# Patient Record
Sex: Female | Born: 1970 | Race: Black or African American | Hispanic: No | Marital: Married | Smoking: Current every day smoker
Health system: Southern US, Community
[De-identification: ages and names within clinical notes are randomized; demographics above are authoritative.]

## PROBLEM LIST (undated history)

## (undated) DIAGNOSIS — R600 Localized edema: Secondary | ICD-10-CM

## (undated) DIAGNOSIS — R200 Anesthesia of skin: Secondary | ICD-10-CM

## (undated) DIAGNOSIS — E785 Hyperlipidemia, unspecified: Secondary | ICD-10-CM

## (undated) DIAGNOSIS — K5190 Ulcerative colitis, unspecified, without complications: Secondary | ICD-10-CM

## (undated) DIAGNOSIS — E876 Hypokalemia: Secondary | ICD-10-CM

## (undated) DIAGNOSIS — I201 Angina pectoris with documented spasm: Secondary | ICD-10-CM

## (undated) DIAGNOSIS — Z8673 Personal history of transient ischemic attack (TIA), and cerebral infarction without residual deficits: Secondary | ICD-10-CM

## (undated) DIAGNOSIS — I219 Acute myocardial infarction, unspecified: Secondary | ICD-10-CM

## (undated) DIAGNOSIS — I214 Non-ST elevation (NSTEMI) myocardial infarction: Secondary | ICD-10-CM

## (undated) DIAGNOSIS — I10 Essential (primary) hypertension: Secondary | ICD-10-CM

## (undated) DIAGNOSIS — K219 Gastro-esophageal reflux disease without esophagitis: Secondary | ICD-10-CM

## (undated) DIAGNOSIS — M797 Fibromyalgia: Secondary | ICD-10-CM

## (undated) HISTORY — DX: Anesthesia of skin: R20.0

## (undated) HISTORY — DX: Non-ST elevation (NSTEMI) myocardial infarction: I21.4

## (undated) HISTORY — PX: ABDOMINAL HYSTERECTOMY: SHX81

## (undated) HISTORY — DX: Hyperlipidemia, unspecified: E78.5

## (undated) HISTORY — PX: EYE SURGERY: SHX253

## (undated) HISTORY — DX: Localized edema: R60.0

## (undated) HISTORY — PX: CARDIAC CATHETERIZATION: SHX172

## (undated) HISTORY — PX: SPLENECTOMY: SUR1306

## (undated) HISTORY — DX: Angina pectoris with documented spasm: I20.1

## (undated) HISTORY — DX: Personal history of transient ischemic attack (TIA), and cerebral infarction without residual deficits: Z86.73

## (undated) HISTORY — DX: Hypokalemia: E87.6

## (undated) HISTORY — PX: CHOLECYSTECTOMY: SHX55

## (undated) HISTORY — PX: APPENDECTOMY: SHX54

---

## 1999-09-07 ENCOUNTER — Other Ambulatory Visit: Admission: RE | Admit: 1999-09-07 | Discharge: 1999-09-07 | Payer: Self-pay | Admitting: Family Medicine

## 2010-09-16 ENCOUNTER — Telehealth: Payer: Self-pay | Admitting: *Deleted

## 2010-09-16 NOTE — Telephone Encounter (Signed)
Recheck if keeps recurring

## 2010-09-16 NOTE — Telephone Encounter (Signed)
Pt is calling to give an update.  She is much better.  Almost toally pain free other than a small amount in her lower back when she moves around.  Did not pass it that she can tell, but she certainly feels much better.

## 2010-09-17 NOTE — Telephone Encounter (Signed)
Left message on pt's voice mail with Dr. Rosezella [place] recommendations.

## 2011-02-15 DIAGNOSIS — R079 Chest pain, unspecified: Secondary | ICD-10-CM | POA: Insufficient documentation

## 2011-02-15 DIAGNOSIS — K219 Gastro-esophageal reflux disease without esophagitis: Secondary | ICD-10-CM | POA: Insufficient documentation

## 2014-01-29 ENCOUNTER — Other Ambulatory Visit: Payer: Self-pay | Admitting: Pain Medicine

## 2014-01-29 DIAGNOSIS — M5136 Other intervertebral disc degeneration, lumbar region: Secondary | ICD-10-CM

## 2014-01-30 ENCOUNTER — Other Ambulatory Visit: Payer: Self-pay | Admitting: Pain Medicine

## 2014-01-30 DIAGNOSIS — M5136 Other intervertebral disc degeneration, lumbar region: Secondary | ICD-10-CM

## 2014-02-02 ENCOUNTER — Other Ambulatory Visit: Payer: Self-pay

## 2014-02-05 ENCOUNTER — Other Ambulatory Visit: Payer: Self-pay

## 2014-02-14 ENCOUNTER — Other Ambulatory Visit: Payer: Self-pay

## 2015-03-20 DIAGNOSIS — I2510 Atherosclerotic heart disease of native coronary artery without angina pectoris: Secondary | ICD-10-CM | POA: Insufficient documentation

## 2015-03-20 DIAGNOSIS — I252 Old myocardial infarction: Secondary | ICD-10-CM | POA: Insufficient documentation

## 2015-03-20 DIAGNOSIS — I10 Essential (primary) hypertension: Secondary | ICD-10-CM | POA: Insufficient documentation

## 2015-04-28 ENCOUNTER — Encounter (HOSPITAL_BASED_OUTPATIENT_CLINIC_OR_DEPARTMENT_OTHER): Payer: Self-pay

## 2015-04-28 ENCOUNTER — Emergency Department (HOSPITAL_BASED_OUTPATIENT_CLINIC_OR_DEPARTMENT_OTHER)
Admission: EM | Admit: 2015-04-28 | Discharge: 2015-04-28 | Disposition: A | Discharge status: Home / Self Care | Payer: PRIVATE HEALTH INSURANCE | Attending: Emergency Medicine | Admitting: Emergency Medicine

## 2015-04-28 ENCOUNTER — Emergency Department (HOSPITAL_BASED_OUTPATIENT_CLINIC_OR_DEPARTMENT_OTHER): Payer: PRIVATE HEALTH INSURANCE

## 2015-04-28 DIAGNOSIS — K219 Gastro-esophageal reflux disease without esophagitis: Secondary | ICD-10-CM | POA: Insufficient documentation

## 2015-04-28 DIAGNOSIS — R05 Cough: Secondary | ICD-10-CM | POA: Insufficient documentation

## 2015-04-28 DIAGNOSIS — Z880 Allergy status to penicillin: Secondary | ICD-10-CM | POA: Insufficient documentation

## 2015-04-28 DIAGNOSIS — I252 Old myocardial infarction: Secondary | ICD-10-CM | POA: Insufficient documentation

## 2015-04-28 DIAGNOSIS — I10 Essential (primary) hypertension: Secondary | ICD-10-CM | POA: Insufficient documentation

## 2015-04-28 DIAGNOSIS — M797 Fibromyalgia: Secondary | ICD-10-CM | POA: Insufficient documentation

## 2015-04-28 DIAGNOSIS — F17200 Nicotine dependence, unspecified, uncomplicated: Secondary | ICD-10-CM | POA: Diagnosis not present

## 2015-04-28 DIAGNOSIS — R059 Cough, unspecified: Secondary | ICD-10-CM

## 2015-04-28 HISTORY — DX: Gastro-esophageal reflux disease without esophagitis: K21.9

## 2015-04-28 HISTORY — DX: Acute myocardial infarction, unspecified: I21.9

## 2015-04-28 HISTORY — DX: Fibromyalgia: M79.7

## 2015-04-28 HISTORY — DX: Essential (primary) hypertension: I10

## 2015-04-28 MED ORDER — BENZONATATE 100 MG PO CAPS: 100.0000 mg | ORAL_CAPSULE | Freq: Three times a day (TID) | ORAL | Status: DC

## 2015-04-28 NOTE — ED Provider Notes (Signed)
CSN: [id number]     Arrival date & time 04/28/15  1609 History   First MD Initiated Contact with Patient 04/28/15 1627     Chief Complaint  Patient presents with  . Cough     (Consider location/radiation/quality/duration/timing/severity/associated sxs/prior Treatment) Patient is a 44 y.o. female presenting with cough. The history is provided by the patient and medical records.  Cough   44 year old female with history of fibromyalgia, MI, hypertension, GERD, presenting to the ED for cough for the past 2 weeks. Patient states cough is wet, but nonproductive. She states she initially had some nasal congestion, sore throat, and sinus pressure, however this is resolved. She denies any fever or chills. Patient has had multiple sick contacts at work. Patient denies any chest pain or shortness of breath. No recent cardiac issues, she has not had any stenting or bypass thus far.  Patient is a daily smoker, approx 1 PPD.  Has tried OTC cough/cold meds without relief.  Past Medical History  Diagnosis Date  . Fibromyalgia   . MI (myocardial infarction) (HCC)   . Hypertension   . GERD (gastroesophageal reflux disease)    Past Surgical History  Procedure Laterality Date  . Splenectomy    . Cholecystectomy    . Appendectomy    . Abdominal hysterectomy    . Eye surgery     No family history on file. Social History  Substance Use Topics  . Smoking status: Current Every Day Smoker  . Smokeless tobacco: None  . Alcohol Use: No   OB History    No data available     Review of Systems  Respiratory: Positive for cough.   All other systems reviewed and are negative.     Allergies  Ivp dye; Lipitor; and Penicillins  Home Medications   Prior to Admission medications   Medication Sig Start Date End Date Taking? Authorizing Provider  AMLODIPINE BESYLATE PO Take by mouth.   Yes Historical Provider, MD  ATENOLOL PO Take by mouth.   Yes Historical Provider, MD  Dexlansoprazole (DEXILANT  PO) Take by mouth.   Yes Historical Provider, MD  LOSARTAN POTASSIUM PO Take by mouth.   Yes Historical Provider, MD  Pregabalin (LYRICA PO) Take by mouth.   Yes Historical Provider, MD  Rosuvastatin Calcium (CRESTOR PO) Take by mouth.   Yes Historical Provider, MD   BP 165/97 mmHg  Pulse 75  Temp(Src) 98.8 F (37.1 C) (Oral)  Resp 18  Ht  (1.702 m)  Wt 91.627 kg  BMI 31.63 kg/m2  SpO2 100%   Physical Exam  Constitutional: She is oriented to person, place, and time. She appears well-developed and well-nourished. No distress.  HENT:  Head: Normocephalic and atraumatic.  Right Ear: Tympanic membrane and ear canal normal.  Left Ear: Tympanic membrane and ear canal normal.  Nose: Nose normal.  Mouth/Throat: Uvula is midline, oropharynx is clear and moist and mucous membranes are normal. No oropharyngeal exudate, posterior oropharyngeal edema, posterior oropharyngeal erythema or tonsillar abscesses.  Eyes: Conjunctivae and EOM are normal. Pupils are equal, round, and reactive to light.  Neck: Normal range of motion. Neck supple.  Cardiovascular: Normal rate, regular rhythm and normal heart sounds.   Pulmonary/Chest: Effort normal and breath sounds normal. No respiratory distress. She has no rales.  Course breath sounds bilaterally without overt wheezes or rhonchi, no rales; speaking in full sentences without difficulty, no distress  Abdominal: Soft. Bowel sounds are normal.  Musculoskeletal: Normal range of motion.  Neurological: She  is alert and oriented to person, place, and time.  Skin: Skin is warm and dry. She is not diaphoretic.  Psychiatric: She has a normal mood and affect.  Nursing note and vitals reviewed.   ED Course  Procedures (including critical care time) Labs Review Labs Reviewed - No data to display  Imaging Review Dg Chest 2 View  04/28/2015  CLINICAL DATA:  Cough, congestion, EXAM: CHEST  2 VIEW COMPARISON:  None. FINDINGS: Cardiomediastinal silhouette  is unremarkable. Mild elevation of the right hemidiaphragm. No acute infiltrate or pleural effusion. No pulmonary edema. Surgical clips are noted in left upper abdomen. Mild degenerative changes lower thoracic spine. IMPRESSION: No active cardiopulmonary disease. Electronically Signed   By: Natasha MeadLiviu  Pop M.D.   On: 04/28/2015 17:14   I have personally reviewed and evaluated these images and lab results as part of my medical decision-making.   EKG Interpretation None      MDM   Final diagnoses:  Cough   44 y.o. F here with cough x 2 weeks.  No chest pain or SOB.  Patient afebrile, non-toxic.  Lungs are coarse sounding, likely from smoking, however no overt wheezes or rhonchi.  CXR was obtained, no acute findings.  Symptoms likely viral in nature.  Patient strongly encouraged to stop smoking as this is likely not helping her symptoms, she was given resources for this.  Rx tessalon.  Encouraged to follow-up with PCP.  Discussed plan with patient, he/she acknowledged understanding and agreed with plan of care.  Return precautions given for new or worsening symptoms.  Garlon HatchetLisa M Aaronmichael Brumbaugh, PA-C 04/28/15 1807  Alvira MondayErin Schlossman, MD 05/03/15 [phone]

## 2015-04-28 NOTE — ED Notes (Signed)
D/c home with rx x 1 for tessalon

## 2015-04-28 NOTE — ED Notes (Signed)
Prod cough x 2 weeks-NAD-steady gait

## 2015-04-28 NOTE — Discharge Instructions (Signed)
Take the prescribed medication as directed to help with cough. Strongly encouraged you to stop smoking.  There are several programs available-- nicorette, chantix, etc.   Follow-up with your primary care physician. Return to the ED for new or worsening symptoms.   If you wish to quit smoking, help is available.  For free tobacco cessation program offerings call the [facility] at [phone] or Live Well Line at [phone] [phone]. You may also visit [email or web address] or email [email or web address]  for more information on other programs.

## 2015-07-27 ENCOUNTER — Emergency Department (HOSPITAL_BASED_OUTPATIENT_CLINIC_OR_DEPARTMENT_OTHER): Payer: PRIVATE HEALTH INSURANCE

## 2015-07-27 ENCOUNTER — Emergency Department (HOSPITAL_BASED_OUTPATIENT_CLINIC_OR_DEPARTMENT_OTHER)
Admission: EM | Admit: 2015-07-27 | Discharge: 2015-07-27 | Disposition: A | Discharge status: Home / Self Care | Payer: PRIVATE HEALTH INSURANCE | Attending: Emergency Medicine | Admitting: Emergency Medicine

## 2015-07-27 ENCOUNTER — Encounter (HOSPITAL_BASED_OUTPATIENT_CLINIC_OR_DEPARTMENT_OTHER): Payer: Self-pay | Admitting: *Deleted

## 2015-07-27 DIAGNOSIS — F17210 Nicotine dependence, cigarettes, uncomplicated: Secondary | ICD-10-CM | POA: Diagnosis not present

## 2015-07-27 DIAGNOSIS — Z880 Allergy status to penicillin: Secondary | ICD-10-CM | POA: Insufficient documentation

## 2015-07-27 DIAGNOSIS — R42 Dizziness and giddiness: Secondary | ICD-10-CM | POA: Insufficient documentation

## 2015-07-27 DIAGNOSIS — M797 Fibromyalgia: Secondary | ICD-10-CM | POA: Diagnosis not present

## 2015-07-27 DIAGNOSIS — J069 Acute upper respiratory infection, unspecified: Secondary | ICD-10-CM | POA: Insufficient documentation

## 2015-07-27 DIAGNOSIS — K219 Gastro-esophageal reflux disease without esophagitis: Secondary | ICD-10-CM | POA: Diagnosis not present

## 2015-07-27 DIAGNOSIS — I10 Essential (primary) hypertension: Secondary | ICD-10-CM | POA: Insufficient documentation

## 2015-07-27 DIAGNOSIS — B9789 Other viral agents as the cause of diseases classified elsewhere: Secondary | ICD-10-CM

## 2015-07-27 DIAGNOSIS — R51 Headache: Secondary | ICD-10-CM | POA: Diagnosis not present

## 2015-07-27 DIAGNOSIS — R079 Chest pain, unspecified: Secondary | ICD-10-CM | POA: Diagnosis present

## 2015-07-27 DIAGNOSIS — I252 Old myocardial infarction: Secondary | ICD-10-CM | POA: Insufficient documentation

## 2015-07-27 DIAGNOSIS — R059 Cough, unspecified: Secondary | ICD-10-CM

## 2015-07-27 DIAGNOSIS — R05 Cough: Secondary | ICD-10-CM

## 2015-07-27 LAB — CBC WITH DIFFERENTIAL/PLATELET
BASOS ABS: 0 10*3/uL (ref 0.0–0.1)
BASOS PCT: 0 %
EOS ABS: 0.2 10*3/uL (ref 0.0–0.7)
EOS PCT: 4 %
HCT: 36.7 % (ref 36.0–46.0)
Hemoglobin: 12.9 g/dL (ref 12.0–15.0)
LYMPHS PCT: 26 %
Lymphs Abs: 1.6 10*3/uL (ref 0.7–4.0)
MCH: 30.2 pg (ref 26.0–34.0)
MCHC: 35.1 g/dL (ref 30.0–36.0)
MCV: 85.9 fL (ref 78.0–100.0)
MONO ABS: 0.7 10*3/uL (ref 0.1–1.0)
Monocytes Relative: 11 %
Neutro Abs: 3.5 10*3/uL (ref 1.7–7.7)
Neutrophils Relative %: 59 %
PLATELETS: 334 10*3/uL (ref 150–400)
RBC: 4.27 MIL/uL (ref 3.87–5.11)
RDW: 13.7 % (ref 11.5–15.5)
WBC: 6 10*3/uL (ref 4.0–10.5)

## 2015-07-27 LAB — BASIC METABOLIC PANEL
ANION GAP: 6 (ref 5–15)
BUN: 7 mg/dL (ref 6–20)
CALCIUM: 8.3 mg/dL — AB (ref 8.9–10.3)
CO2: 24 mmol/L (ref 22–32)
Chloride: 107 mmol/L (ref 101–111)
Creatinine, Ser: 0.82 mg/dL (ref 0.44–1.00)
Glucose, Bld: 96 mg/dL (ref 65–99)
POTASSIUM: 3.5 mmol/L (ref 3.5–5.1)
SODIUM: 137 mmol/L (ref 135–145)

## 2015-07-27 LAB — TROPONIN I

## 2015-07-27 NOTE — ED Notes (Signed)
Pt reports dizzy and headache since Saturday. Chest pain yesterday morning. States sx "come and go". Speech clear, facial symmetry present. No reported fever

## 2015-07-27 NOTE — ED Provider Notes (Signed)
CSN: [id number]     Arrival date & time 07/27/15  0944 History   First MD Initiated Contact with Patient 07/27/15 [phone]     Chief Complaint  Patient presents with  . Headache  . Dizziness  . Chest Pain     (Consider location/radiation/quality/duration/timing/severity/associated sxs/prior Treatment) HPI Comments: Patient presents today with dizziness, headache, cough, sore throat, congestion, nausea, subjective fever, and body aches.  Onset of symptoms two days ago, which are gradually worsening.  She has not taken any medications for her symptoms.  She reports sick contacts with similar symptoms.  Denies vomiting, diarrhea, ear pain, sinus pain, syncope, SOB, or neck pain/stiffness.  The history is provided by the patient.    Past Medical History  Diagnosis Date  . Fibromyalgia   . MI (myocardial infarction) (HCC)   . Hypertension   . GERD (gastroesophageal reflux disease)    Past Surgical History  Procedure Laterality Date  . Splenectomy    . Cholecystectomy    . Appendectomy    . Abdominal hysterectomy    . Eye surgery    . Cardiac catheterization     No family history on file. Social History  Substance Use Topics  . Smoking status: Current Every Day Smoker    Types: Cigarettes  . Smokeless tobacco: Never Used  . Alcohol Use: No   OB History    No data available     Review of Systems  All other systems reviewed and are negative.     Allergies  Ivp dye; Lipitor; and Penicillins  Home Medications   Prior to Admission medications   Medication Sig Start Date End Date Taking? Authorizing Provider  AMLODIPINE BESYLATE PO Take by mouth.   Yes Historical Provider, MD  ATENOLOL PO Take by mouth.   Yes Historical Provider, MD  LOSARTAN POTASSIUM PO Take by mouth.   Yes Historical Provider, MD  oxyCODONE (ROXICODONE) 15 MG immediate release tablet Take 15 mg by mouth every 4 (four) hours as needed for pain.   Yes Historical Provider, MD  Rosuvastatin Calcium  (CRESTOR PO) Take by mouth.   Yes Historical Provider, MD  benzonatate (TESSALON) 100 MG capsule Take 1 capsule (100 mg total) by mouth every 8 (eight) hours. 04/28/15   Garlon HatchetLisa M Sanders, PA-C  Dexlansoprazole (DEXILANT PO) Take by mouth.    Historical Provider, MD  Pregabalin (LYRICA PO) Take by mouth.    Historical Provider, MD   BP 156/99 mmHg  Pulse 78  Temp(Src) 98.3 F (36.8 C) (Oral)  Resp 18  Ht 5\' 7"  (1.702 m)  Wt 87.544 kg  BMI 30.22 kg/m2  SpO2 95% Physical Exam  Constitutional: She is oriented to person, place, and time. She appears well-developed and well-nourished.  HENT:  Head: Normocephalic and atraumatic.  Right Ear: Tympanic membrane and ear canal normal.  Left Ear: Tympanic membrane and ear canal normal.  Mouth/Throat: Uvula is midline and oropharynx is clear and moist. No oropharyngeal exudate, posterior oropharyngeal edema, posterior oropharyngeal erythema or tonsillar abscesses.  Eyes: Pupils are equal, round, and reactive to light.  Left eye stribismus  Neck: Normal range of motion. Neck supple.  Cardiovascular: Normal rate, regular rhythm and normal heart sounds.   Pulmonary/Chest: Effort normal and breath sounds normal.  Abdominal: Soft. There is no tenderness.  Musculoskeletal: Normal range of motion.  Neurological: She is alert and oriented to person, place, and time. She has normal strength. No cranial nerve deficit or sensory deficit. Coordination and gait normal.  Skin:  Skin is warm and dry.  Psychiatric: She has a normal mood and affect.  Nursing note and vitals reviewed.   ED Course  Procedures (including critical care time) Labs Review Labs Reviewed - No data to display  Imaging Review No results found. I have personally reviewed and evaluated these images and lab results as part of my medical decision-making.   EKG Interpretation   Date/Time:  Monday July 27 2015 09:59:06 EDT Ventricular Rate:  75 PR Interval:  162 QRS Duration:  100 QT Interval:  363 QTC Calculation: 405 R Axis:   154 Text Interpretation:  Right and left arm electrode reversal Sinus rhythm  Confirmed by DELO  MD, DOUGLAS ([id number]) on 07/27/2015 10:05:10 AM      MDM   Final diagnoses:  None   Patient presents today with subjective fever, headache, cough, body aches, cough, sore throat, nausea, and dizziness x 2 days.  Patient afebrile and non toxic appearing in the ED.  Labs unremarkable.  CXR is negative.  Feel that the patient is stable for discharge.  Return precautions given.    Santiago Glad, PA-C 07/28/15 1914  Geoffery Lyons, MD 07/30/15 [phone]

## 2015-07-27 NOTE — ED Notes (Signed)
Patient transported to X-ray 

## 2016-02-25 DIAGNOSIS — D72829 Elevated white blood cell count, unspecified: Secondary | ICD-10-CM | POA: Insufficient documentation

## 2016-10-19 ENCOUNTER — Emergency Department (HOSPITAL_BASED_OUTPATIENT_CLINIC_OR_DEPARTMENT_OTHER): Payer: PRIVATE HEALTH INSURANCE

## 2016-10-19 ENCOUNTER — Emergency Department (HOSPITAL_BASED_OUTPATIENT_CLINIC_OR_DEPARTMENT_OTHER)
Admission: EM | Admit: 2016-10-19 | Discharge: 2016-10-20 | Disposition: A | Discharge status: Other Acute Inpatient Hospital | Payer: PRIVATE HEALTH INSURANCE | Attending: Emergency Medicine | Admitting: Emergency Medicine

## 2016-10-19 ENCOUNTER — Encounter (HOSPITAL_BASED_OUTPATIENT_CLINIC_OR_DEPARTMENT_OTHER): Payer: Self-pay | Admitting: *Deleted

## 2016-10-19 DIAGNOSIS — I639 Cerebral infarction, unspecified: Secondary | ICD-10-CM

## 2016-10-19 DIAGNOSIS — F17210 Nicotine dependence, cigarettes, uncomplicated: Secondary | ICD-10-CM | POA: Insufficient documentation

## 2016-10-19 HISTORY — DX: Ulcerative colitis, unspecified, without complications: K51.90

## 2016-10-19 LAB — CBC WITH DIFFERENTIAL/PLATELET
Basophils Absolute: 0 10*3/uL (ref 0.0–0.1)
Basophils Relative: 0 %
EOS ABS: 0.4 10*3/uL (ref 0.0–0.7)
Eosinophils Relative: 3 %
HCT: 37.1 % (ref 36.0–46.0)
HEMOGLOBIN: 13.1 g/dL (ref 12.0–15.0)
LYMPHS ABS: 2.9 10*3/uL (ref 0.7–4.0)
LYMPHS PCT: 22 %
MCH: 32.7 pg (ref 26.0–34.0)
MCHC: 35.3 g/dL (ref 30.0–36.0)
MCV: 92.5 fL (ref 78.0–100.0)
MONOS PCT: 8 %
Monocytes Absolute: 1 10*3/uL (ref 0.1–1.0)
NEUTROS PCT: 67 %
Neutro Abs: 8.8 10*3/uL — ABNORMAL HIGH (ref 1.7–7.7)
Platelets: 297 10*3/uL (ref 150–400)
RBC: 4.01 MIL/uL (ref 3.87–5.11)
RDW: 16 % — ABNORMAL HIGH (ref 11.5–15.5)
WBC: 13.1 10*3/uL — AB (ref 4.0–10.5)

## 2016-10-19 LAB — COMPREHENSIVE METABOLIC PANEL
ALK PHOS: 62 U/L (ref 38–126)
ALT: 12 U/L — AB (ref 14–54)
ANION GAP: 7 (ref 5–15)
AST: 17 U/L (ref 15–41)
Albumin: 3.6 g/dL (ref 3.5–5.0)
BILIRUBIN TOTAL: 0.9 mg/dL (ref 0.3–1.2)
BUN: 13 mg/dL (ref 6–20)
CALCIUM: 8.9 mg/dL (ref 8.9–10.3)
CO2: 24 mmol/L (ref 22–32)
CREATININE: 0.9 mg/dL (ref 0.44–1.00)
Chloride: 106 mmol/L (ref 101–111)
Glucose, Bld: 88 mg/dL (ref 65–99)
Potassium: 3.6 mmol/L (ref 3.5–5.1)
SODIUM: 137 mmol/L (ref 135–145)
TOTAL PROTEIN: 6.7 g/dL (ref 6.5–8.1)

## 2016-10-19 LAB — TROPONIN I

## 2016-10-19 MED ORDER — ASPIRIN 81 MG PO CHEW
324.0000 mg | CHEWABLE_TABLET | Freq: Once | ORAL | Status: AC
  Administered 2016-10-19: 324 mg via ORAL
  Filled 2016-10-19: qty 4

## 2016-10-19 NOTE — ED Triage Notes (Signed)
Pt reports intermittent R-sided numbness and tingling lasting for several seconds since this past Saturday. Reports episodes are followed by weakness that quickly resolves. Pt reports she started taking Vistaril this past Friday. No symptoms at this time; face symmetrical, grips equal. Pt currently on Cipro and Flagyl for recent UC flare.

## 2016-10-19 NOTE — ED Notes (Signed)
Patient transported to CT 

## 2016-10-19 NOTE — ED Provider Notes (Addendum)
[facility] Provider Note   CSN: [id number] Arrival date & time: 10/19/16  1815  By signing my name below, I, Rosario AdieWilliam Andrew Hiatt, attest that this documentation has been prepared under the direction and in the presence of Vanetta MuldersZackowski, Laureen Frederic, MD. Electronically Signed: Rosario AdieWilliam Andrew Hiatt, ED Scribe. 10/19/16. 9:39 PM.  History   Chief Complaint Chief Complaint  Patient presents with  . Numbness   The history is provided by the patient. No language interpreter was used.    HPI Comments: Paige NormanSonya Woodward is a 46 y.o. female with a PMHx of GERD, HTN, Fibromyalgia, prior MI, and ulcerative colitis, who presents to the Emergency Department complaining of intermittent episodes of right-sided numbness beginning four days ago. Per pt, over the past several days she has had two episodes of numbness four days ago which lasted for only a several seconds prior to resolving. She reports that her numbness begins in her right hand and will extend up the right arm into the right-sided jaw. She also reports some numbness into the right leg as well. Pt also reports some blurry visual "spots" within her vision accompanying her numbness which will last for several hours and some shortness of breath only with her episodes of numbness. Pt additionally endorses some headache, lower back pain, and one episode of vomiting one day prior to the onset of her numbness. She has not noticed any precipitating factors to her numbness. Pt reports that she began taking Vistaril the same day as her numbness began. No noted treatments for her symptoms were tried prior to coming into the ED. Pt denies fever, chills, congestion, rhinorrhea, sore throat, cough, CP, leg swelling, abdominal pain, diarrhea, dysuria, hematuria, neck pain, rash, bruising/bleeding easily. Pt is not currently on anticoagulants.   PCP: Norm SaltVanstory, Ashley N, PA-C  Neurologist: Verlee RossettiAndrew Dein, PA-C    Past Medical History:  Diagnosis Date  .  Fibromyalgia   . GERD (gastroesophageal reflux disease)   . Hypertension   . MI (myocardial infarction) (HCC)   . Ulcerative colitis (HCC)    There are no active problems to display for this patient.  Past Surgical History:  Procedure Laterality Date  . ABDOMINAL HYSTERECTOMY    . APPENDECTOMY    . CARDIAC CATHETERIZATION    . CHOLECYSTECTOMY    . EYE SURGERY    . SPLENECTOMY     OB History    No data available     Home Medications    Prior to Admission medications   Medication Sig Start Date End Date Taking? Authorizing Provider  AMLODIPINE BESYLATE PO Take by mouth.   Yes [provider]  ATENOLOL PO Take by mouth.   Yes [provider]  HydrOXYzine Pamoate (VISTARIL PO) Take by mouth.   Yes [provider]  LOSARTAN POTASSIUM PO Take by mouth.   Yes [provider]  OMEPRAZOLE PO Take by mouth.   Yes [provider]  oxyCODONE (ROXICODONE) 15 MG immediate release tablet Take 15 mg by mouth every 4 (four) hours as needed for pain.   Yes [provider]  benzonatate (TESSALON) 100 MG capsule Take 1 capsule (100 mg total) by mouth every 8 (eight) hours. 04/28/15   Garlon HatchetSanders, Lisa M, PA-C  Dexlansoprazole (DEXILANT PO) Take by mouth.    [provider]  Pregabalin (LYRICA PO) Take by mouth.    [provider]  Rosuvastatin Calcium (CRESTOR PO) Take by mouth.    [provider]   Family History No family  history on file.  Social History Social History  Substance Use Topics  . Smoking status: Current Every Day Smoker    Types: Cigarettes  . Smokeless tobacco: Never Used  . Alcohol use No   Allergies   Ivp dye [iodinated diagnostic agents]; Lipitor [atorvastatin]; and Penicillins  Review of Systems Review of Systems  Constitutional: Negative for chills and fever.  HENT: Negative for congestion, rhinorrhea and sore throat.   Eyes: Positive for visual disturbance.  Respiratory: Negative for  cough and shortness of breath.   Cardiovascular: Negative for chest pain and leg swelling.  Gastrointestinal: Negative for abdominal pain, diarrhea, nausea and vomiting.  Genitourinary: Negative for dysuria and hematuria.  Musculoskeletal: Positive for back pain. Negative for neck pain.  Skin: Negative for rash.  Neurological: Positive for numbness and headaches.  Hematological: Does not bruise/bleed easily.   Physical Exam Updated Vital Signs BP 110/72   Pulse 65   Temp 98.3 F (36.8 C) (Oral)   Resp 16   Ht 1.702 m (5\' 7" )   Wt 73 kg (161 lb)   SpO2 96%   BMI 25.22 kg/m   Physical Exam  Constitutional: She is oriented to person, place, and time. She appears well-developed and well-nourished.  HENT:  Head: Normocephalic.  Mouth/Throat: Oropharynx is clear and moist.  Eyes: Conjunctivae are normal. Pupils are equal, round, and reactive to light. Right eye exhibits no discharge. Left eye exhibits no discharge. No scleral icterus.  Left eye with normal EOMs. Right eye with lateral gaze, but unable to gaze downward and medially which is chronic, per pt.   Cardiovascular: Normal rate, regular rhythm and normal heart sounds.   No murmur heard. Pulmonary/Chest: Effort normal and breath sounds normal. No respiratory distress. She has no wheezes. She has no rales.  Abdominal: Soft. Bowel sounds are normal. She exhibits no distension. There is no tenderness.  Musculoskeletal: Normal range of motion. She exhibits no edema.  Neurological: She is alert and oriented to person, place, and time. No cranial nerve deficit. She exhibits normal muscle tone. Coordination normal.  No pronator drift.   Skin: Skin is warm and dry.  Psychiatric: She has a normal mood and affect.  Nursing note and vitals reviewed.  ED Treatments / Results  DIAGNOSTIC STUDIES: Oxygen Saturation is 100% on RA, normal by my interpretation.   COORDINATION OF CARE: 9:39 PM-Discussed next steps with pt. Pt verbalized  understanding and is agreeable with the plan.   Labs (all labs ordered are listed, but only abnormal results are displayed) Labs Reviewed  CBC WITH DIFFERENTIAL/PLATELET - Abnormal; Notable for the following:       Result Value   WBC 13.1 (*)    RDW 16.0 (*)    Neutro Abs 8.8 (*)    All other components within normal limits  COMPREHENSIVE METABOLIC PANEL - Abnormal; Notable for the following:    ALT 12 (*)    All other components within normal limits  TROPONIN I   Results for orders placed or performed during the hospital encounter of 10/19/16  CBC with Differential  Result Value Ref Range   WBC 13.1 (H) 4.0 - 10.5 K/uL   RBC 4.01 3.87 - 5.11 MIL/uL   Hemoglobin 13.1 12.0 - 15.0 g/dL   HCT 16.1 09.6 - 04.5 %   MCV 92.5 78.0 - 100.0 fL   MCH 32.7 26.0 - 34.0 pg   MCHC 35.3 30.0 - 36.0 g/dL   RDW 40.9 (H) 81.1 - 91.4 %  Platelets 297 150 - 400 K/uL   Neutrophils Relative % 67 %   Neutro Abs 8.8 (H) 1.7 - 7.7 K/uL   Lymphocytes Relative 22 %   Lymphs Abs 2.9 0.7 - 4.0 K/uL   Monocytes Relative 8 %   Monocytes Absolute 1.0 0.1 - 1.0 K/uL   Eosinophils Relative 3 %   Eosinophils Absolute 0.4 0.0 - 0.7 K/uL   Basophils Relative 0 %   Basophils Absolute 0.0 0.0 - 0.1 K/uL  Comprehensive metabolic panel  Result Value Ref Range   Sodium 137 135 - 145 mmol/L   Potassium 3.6 3.5 - 5.1 mmol/L   Chloride 106 101 - 111 mmol/L   CO2 24 22 - 32 mmol/L   Glucose, Bld 88 65 - 99 mg/dL   BUN 13 6 - 20 mg/dL   Creatinine, Ser 1.61 0.44 - 1.00 mg/dL   Calcium 8.9 8.9 - 09.6 mg/dL   Total Protein 6.7 6.5 - 8.1 g/dL   Albumin 3.6 3.5 - 5.0 g/dL   AST 17 15 - 41 U/L   ALT 12 (L) 14 - 54 U/L   Alkaline Phosphatase 62 38 - 126 U/L   Total Bilirubin 0.9 0.3 - 1.2 mg/dL   GFR calc non Af Amer >60 >60 mL/min   GFR calc Af Amer >60 >60 mL/min   Anion gap 7 5 - 15  Troponin I  Result Value Ref Range   Troponin I <0.03 <0.03 ng/mL    EKG  EKG Interpretation  Date/Time:  Wednesday  October 19 2016 18:32:03 EDT Ventricular Rate:  62 PR Interval:  134 QRS Duration: 88 QT Interval:  386 QTC Calculation: 391 R Axis:   25 Text Interpretation:  Normal sinus rhythm Normal ECG Confirmed by Vanetta Mulders [phone]) on 10/19/2016 6:42:48 PM      Radiology Ct Head Wo Contrast  Result Date: 10/19/2016 CLINICAL DATA:  Periodically right arm weakness and numbness for 5 days EXAM: CT HEAD WITHOUT CONTRAST TECHNIQUE: Contiguous axial images were obtained from the base of the skull through the vertex without intravenous contrast. COMPARISON:  None. FINDINGS: Brain: No hemorrhage or intracranial mass is seen. Subtle hypodensity within the left posterior frontal lobe cortex. Ventricles are nonenlarged. Vascular: No hyperdense vessels.  No unexpected calcification. Skull: No fracture or suspicious bone lesion Sinuses/Orbits: No acute finding. Other: None IMPRESSION: Subtle hypodensity in the left frontal lobe, cannot exclude a small focus of edema or infarct. Further evaluation with MRI is suggested. Electronically Signed   By: Jasmine Pang M.D.   On: 10/19/2016 21:34   Procedures Procedures   Medications Ordered in ED Medications - No data to display  Initial Impression / Assessment and Plan / ED Course  I have reviewed the triage vital signs and the nursing notes.  Pertinent labs & imaging results that were available during my care of the patient were reviewed by me and considered in my medical decision making (see chart for details).    CRITICAL CARE Performed by: Vanetta Mulders Total critical care time: 30 minutes Critical care time was exclusive of separately billable procedures and treating other patients. Critical care was necessary to treat or prevent imminent or life-threatening deterioration. Critical care was time spent personally by me on the following activities: development of treatment plan with patient and/or surrogate as well as nursing, discussions with consultants,  evaluation of patient's response to treatment, examination of patient, obtaining history from patient or surrogate, ordering and performing treatments and interventions, ordering and review of  laboratory studies, ordering and review of radiographic studies, pulse oximetry and re-evaluation of patient's condition.  Patient with complaint of symptoms concerning for possible TIA. But very brief period and head CT shows evidence of a left frontal lobe acute infarct. Possibly could be something else but does require MRI to evaluate further. Not sure if patient's symptoms are related to this or not. Discussed with hospitalist at [facility]. Patient's primary care doctor is based out of that hospital. They will admit to telemetry. As a CVA. Patient be transported by [employer].  Patients currently asymptomatic. No neurological symptoms currently. Long STANDING of right eye muscular problem since age 67 so has been no good downward gauge or medial gaze.  Patient also with some chest discomfort which she says more consistent with her reflux. She's not too concerned about that. Troponin was negative. EKG didn't have any acute findings.  Patient will be transported to [facility] for admission to telemetry bed by [facility]. Transfer paperwork completed. Nevus with a normal  The patient is followed by neurology [place] [facility].    A she'll be given aspirin here.   Final Clinical Impressions(s) / ED Diagnoses   Final diagnoses:  Cerebrovascular accident (CVA), unspecified mechanism (HCC)   New Prescriptions New Prescriptions   No medications on file   I personally performed the services described in this documentation, which was scribed in my presence. The recorded information has been reviewed and is accurate.       Vanetta Mulders, MD 10/19/16 2440    Vanetta Mulders, MD 10/19/16 2308

## 2016-10-19 NOTE — ED Notes (Signed)
C/o rt side numbness off and on since Saturday lasting a few seconds at a time,  States starts in hand radiates up arm to face and  leg,  Has not seen any facial droop.  Pt a&o

## 2016-10-19 NOTE — ED Notes (Signed)
Pt now c/o chest pain  2nd ekg obtained

## 2016-10-20 DIAGNOSIS — G459 Transient cerebral ischemic attack, unspecified: Secondary | ICD-10-CM | POA: Insufficient documentation

## 2016-10-20 DIAGNOSIS — I63512 Cerebral infarction due to unspecified occlusion or stenosis of left middle cerebral artery: Secondary | ICD-10-CM | POA: Insufficient documentation

## 2016-11-10 DIAGNOSIS — D6859 Other primary thrombophilia: Secondary | ICD-10-CM | POA: Insufficient documentation

## 2017-09-23 ENCOUNTER — Other Ambulatory Visit: Payer: Self-pay

## 2017-09-23 ENCOUNTER — Emergency Department (HOSPITAL_BASED_OUTPATIENT_CLINIC_OR_DEPARTMENT_OTHER)
Admission: EM | Admit: 2017-09-23 | Discharge: 2017-09-23 | Disposition: A | Discharge status: Home / Self Care | Payer: BLUE CROSS/BLUE SHIELD | Attending: Emergency Medicine | Admitting: Emergency Medicine

## 2017-09-23 ENCOUNTER — Encounter (HOSPITAL_BASED_OUTPATIENT_CLINIC_OR_DEPARTMENT_OTHER): Payer: Self-pay | Admitting: Adult Health

## 2017-09-23 DIAGNOSIS — R1013 Epigastric pain: Secondary | ICD-10-CM

## 2017-09-23 DIAGNOSIS — Z79899 Other long term (current) drug therapy: Secondary | ICD-10-CM | POA: Diagnosis not present

## 2017-09-23 DIAGNOSIS — F17210 Nicotine dependence, cigarettes, uncomplicated: Secondary | ICD-10-CM | POA: Insufficient documentation

## 2017-09-23 DIAGNOSIS — I252 Old myocardial infarction: Secondary | ICD-10-CM | POA: Insufficient documentation

## 2017-09-23 DIAGNOSIS — I10 Essential (primary) hypertension: Secondary | ICD-10-CM | POA: Diagnosis not present

## 2017-09-23 DIAGNOSIS — R110 Nausea: Secondary | ICD-10-CM

## 2017-09-23 LAB — CBC WITH DIFFERENTIAL/PLATELET
BASOS ABS: 0 10*3/uL (ref 0.0–0.1)
BASOS PCT: 0 %
EOS ABS: 0.1 10*3/uL (ref 0.0–0.7)
EOS PCT: 0 %
HCT: 40.7 % (ref 36.0–46.0)
Hemoglobin: 15 g/dL (ref 12.0–15.0)
Lymphocytes Relative: 12 %
Lymphs Abs: 1.8 10*3/uL (ref 0.7–4.0)
MCH: 32.5 pg (ref 26.0–34.0)
MCHC: 36.9 g/dL — ABNORMAL HIGH (ref 30.0–36.0)
MCV: 88.3 fL (ref 78.0–100.0)
MONO ABS: 0.9 10*3/uL (ref 0.1–1.0)
Monocytes Relative: 6 %
Neutro Abs: 12.4 10*3/uL — ABNORMAL HIGH (ref 1.7–7.7)
Neutrophils Relative %: 82 %
PLATELETS: 256 10*3/uL (ref 150–400)
RBC: 4.61 MIL/uL (ref 3.87–5.11)
RDW: 14.7 % (ref 11.5–15.5)
WBC: 15.2 10*3/uL — AB (ref 4.0–10.5)

## 2017-09-23 LAB — COMPREHENSIVE METABOLIC PANEL
ALBUMIN: 4.3 g/dL (ref 3.5–5.0)
ALK PHOS: 74 U/L (ref 38–126)
ALT: 10 U/L — ABNORMAL LOW (ref 14–54)
AST: 16 U/L (ref 15–41)
Anion gap: 11 (ref 5–15)
BILIRUBIN TOTAL: 0.8 mg/dL (ref 0.3–1.2)
BUN: 18 mg/dL (ref 6–20)
CALCIUM: 9.6 mg/dL (ref 8.9–10.3)
CO2: 25 mmol/L (ref 22–32)
CREATININE: 0.92 mg/dL (ref 0.44–1.00)
Chloride: 100 mmol/L — ABNORMAL LOW (ref 101–111)
GFR calc Af Amer: >60 mL/min (ref >60)
GLUCOSE: 115 mg/dL — AB (ref 65–99)
POTASSIUM: 3.2 mmol/L — AB (ref 3.5–5.1)
Sodium: 136 mmol/L (ref 135–145)
TOTAL PROTEIN: 7.6 g/dL (ref 6.5–8.1)

## 2017-09-23 LAB — LIPASE, BLOOD: LIPASE: 34 U/L (ref 11–51)

## 2017-09-23 LAB — OCCULT BLOOD X 1 CARD TO LAB, STOOL: Fecal Occult Bld: NEGATIVE

## 2017-09-23 MED ORDER — FAMOTIDINE IN NACL 20-0.9 MG/50ML-% IV SOLN
20.0000 mg | Freq: Once | INTRAVENOUS | Status: AC
  Administered 2017-09-23: 20 mg via INTRAVENOUS
  Filled 2017-09-23: qty 50

## 2017-09-23 MED ORDER — ONDANSETRON HCL 4 MG/2ML IJ SOLN
4.0000 mg | Freq: Once | INTRAMUSCULAR | Status: AC
  Administered 2017-09-23: 4 mg via INTRAVENOUS
  Filled 2017-09-23: qty 2

## 2017-09-23 MED ORDER — SODIUM CHLORIDE 0.9 % IV BOLUS
1000.0000 mL | Freq: Once | INTRAVENOUS | Status: AC
  Administered 2017-09-23: 1000 mL via INTRAVENOUS

## 2017-09-23 MED ORDER — POTASSIUM CHLORIDE CRYS ER 20 MEQ PO TBCR
40.0000 meq | EXTENDED_RELEASE_TABLET | Freq: Once | ORAL | Status: AC
  Administered 2017-09-23: 40 meq via ORAL
  Filled 2017-09-23: qty 2

## 2017-09-23 MED ORDER — PROMETHAZINE HCL 25 MG PO TABS: 25.0000 mg | ORAL_TABLET | Freq: Four times a day (QID) | ORAL | 0 refills | Status: DC | PRN

## 2017-09-23 MED ORDER — GI COCKTAIL ~~LOC~~
30.0000 mL | Freq: Once | ORAL | Status: AC
  Administered 2017-09-23: 30 mL via ORAL
  Filled 2017-09-23: qty 30

## 2017-09-23 MED ORDER — SUCRALFATE 1 G PO TABS: 1.0000 g | ORAL_TABLET | Freq: Three times a day (TID) | ORAL | 0 refills | Status: DC

## 2017-09-23 NOTE — Discharge Instructions (Signed)
It was my pleasure taking care of you today!   I have given you a refill of your phenergan to take as needed for nausea.   Carafate with meals and at bedtime to help with epigastric pain.  Continue taking your protonix as described.   Please call your GI doctor on Monday to schedule a follow up appointment.   Return to ER for new or worsening symptoms, any additional concerns.

## 2017-09-23 NOTE — ED Provider Notes (Signed)
[facility] EMERGENCY DEPARTMENT Provider Note   CSN: [id number] Arrival date & time: 09/23/17  1250     History   Chief Complaint Chief Complaint  Patient presents with  . Nausea    HPI Paige Woodward is a 47 y.o. female.  The history is provided by the patient and medical records. No language interpreter was used.   Paige Woodward is a 47 y.o. female  with a PMH of GERD, duodenitis, irritable bowel syndrome followed by [facility] in [facility] who presents to the Emergency Department complaining of burning epigastric pain which began 2-3 days ago. Associated symptoms include nausea, 4-5 episodes of non-bloody emesis (although none today) and black watery stools. She did try Pepto-Bismol for some relief, however notes dark stool started prior to taking this medication.  She has been taking Protonix twice daily as directed by her GI doctor.  She reports she had EGD back in February after having an episode of similar symptoms including dark stools and burning epigastric pain. She was told there was a lot of inflammation, but no bleeding.  No fever or chills.  No urinary symptoms, vaginal discharge or back pain. Hx of prior appendectomy, cholecystectomy, splenectomy, hysterectomy.   Past Medical History:  Diagnosis Date  . Fibromyalgia   . GERD (gastroesophageal reflux disease)   . Hypertension   . MI (myocardial infarction) (HCC)   . Ulcerative colitis (HCC)     There are no active problems to display for this patient.   Past Surgical History:  Procedure Laterality Date  . ABDOMINAL HYSTERECTOMY    . APPENDECTOMY    . CARDIAC CATHETERIZATION    . CHOLECYSTECTOMY    . EYE SURGERY    . SPLENECTOMY       OB History   None      Home Medications    Prior to Admission medications   Medication Sig Start Date End Date Taking? Authorizing Provider  losartan (COZAAR) 50 MG tablet Take by mouth. 04/07/15  Yes [provider]  oxyCODONE (ROXICODONE) 15  MG immediate release tablet Take by mouth. 09/17/17  Yes [provider]  pantoprazole (PROTONIX) 40 MG tablet Take by mouth. 09/06/16  Yes [provider]  amLODipine (NORVASC) 10 MG tablet Take by mouth.    [provider]  AMLODIPINE BESYLATE PO Take by mouth.    [provider]  atenolol (TENORMIN) 100 MG tablet Take by mouth.    [provider]  ATENOLOL PO Take by mouth.    [provider]  benzonatate (TESSALON) 100 MG capsule Take 1 capsule (100 mg total) by mouth every 8 (eight) hours. 04/28/15   Garlon Hatchet, PA-C  Dexlansoprazole (DEXILANT PO) Take by mouth.    [provider]  HydrOXYzine Pamoate (VISTARIL PO) Take by mouth.    [provider]  LOSARTAN POTASSIUM PO Take by mouth.    [provider]  OMEPRAZOLE PO Take by mouth.    [provider]  oxyCODONE (ROXICODONE) 15 MG immediate release tablet Take 15 mg by mouth every 4 (four) hours as needed for pain.    [provider]  Pregabalin (LYRICA PO) Take by mouth.    [provider]  promethazine (PHENERGAN) 25 MG tablet Take 1 tablet (25 mg total) by mouth every 6 (six) hours as needed for nausea. 09/23/17   Milanya Sunderland, Chase Picket, PA-C  Rosuvastatin Calcium (CRESTOR PO) Take by mouth.    [provider]  sucralfate (CARAFATE)  1 g tablet Take 1 tablet (1 g total) by mouth 4 (four) times daily -  with meals and at bedtime. 09/23/17   Ludwika Rodd, Chase Picket, PA-C    Family History History reviewed. No pertinent family history.  Social History Social History   Tobacco Use  . Smoking status: Current Every Day Smoker    Types: Cigarettes  . Smokeless tobacco: Never Used  Substance Use Topics  . Alcohol use: No  . Drug use: No     Allergies   Ivp dye [iodinated diagnostic agents]; Lipitor [atorvastatin]; and Penicillins   Review of Systems Review of Systems  Gastrointestinal: Positive for abdominal pain,  nausea and vomiting.       + dark stool  All other systems reviewed and are negative.    Physical Exam Updated Vital Signs BP (!) 152/98 (BP Location: Right Arm)   Pulse (!) 57   Temp 98.6 F (37 C) (Oral)   Resp 18   SpO2 100%   Physical Exam  Constitutional: She is oriented to person, place, and time. She appears well-developed and well-nourished. No distress.  HENT:  Head: Normocephalic and atraumatic.  Neck: Neck supple.  Cardiovascular: Normal rate, regular rhythm and normal heart sounds.  No murmur heard. Pulmonary/Chest: Effort normal and breath sounds normal. No respiratory distress.  Abdominal: Soft. Bowel sounds are normal. She exhibits no distension.  Tenderness to palpation of epigastrium and left upper quadrant. No rebound or guarding. No CVA tenderness.   Genitourinary:  Genitourinary Comments: External hemorrhoid noted. No bright red blood. Light brown stool on exam.   Neurological: She is alert and oriented to person, place, and time.  Skin: Skin is warm and dry.  Nursing note and vitals reviewed.    ED Treatments / Results  Labs (all labs ordered are listed, but only abnormal results are displayed) Labs Reviewed  COMPREHENSIVE METABOLIC PANEL - Abnormal; Notable for the following components:      Result Value   Potassium 3.2 (*)    Chloride 100 (*)    Glucose, Bld 115 (*)    ALT 10 (*)    All other components within normal limits  CBC WITH DIFFERENTIAL/PLATELET - Abnormal; Notable for the following components:   WBC 15.2 (*)    MCHC 36.9 (*)    Neutro Abs 12.4 (*)    All other components within normal limits  LIPASE, BLOOD  OCCULT BLOOD X 1 CARD TO LAB, STOOL    EKG None  Radiology No results found.  Procedures Procedures (including critical care time)  Medications Ordered in ED Medications  sodium chloride 0.9 % bolus 1,000 mL (0 mLs Intravenous Stopped 09/23/17 1442)  gi cocktail (Maalox,Lidocaine,Donnatal) (30 mLs Oral Given 09/23/17  1404)  ondansetron (ZOFRAN) injection 4 mg (4 mg Intravenous Given 09/23/17 1404)  famotidine (PEPCID) IVPB 20 mg premix (0 mg Intravenous Stopped 09/23/17 1441)  potassium chloride SA (K-DUR,KLOR-CON) CR tablet 40 mEq (40 mEq Oral Given 09/23/17 1441)     Initial Impression / Assessment and Plan / ED Course  I have reviewed the triage vital signs and the nursing notes.  Pertinent labs & imaging results that were available during my care of the patient were reviewed by me and considered in my medical decision making (see chart for details).    Suprina Mandeville is a 47 y.o. female who presents to ED for epigastric pain, n/v and dark stools. Hx of similar and followed by GI. Per patient, she had EGD at the beginning of  the year which showed inflammation, but active bleeding. After EGD, her GI doc increased her Protonix to  BID - she has been compliant with this. She is febrile, hemodynamically stable with nonsurgical abdomen.  Light brown stool on rectal exam.  Hemoccult negative.  Labs reviewed.  She does have leukocytosis of 15.2, mild hypokalemia which was replenished.  Given symptomatic medications in the emergency department.  She has not had any emesis and has been tolerating p.o. without difficulty since medication administration.  Her symptoms have improved with this as well.  Repeat abdominal exam reassuring.  Hemodynamically stable upon discharge. Evaluation does not show pathology that would require ongoing emergent intervention or inpatient treatment.  Refilled her home Phenergan.  Will add Carafate to her regimen and have her follow-up with her GI doc.  Reasons to return to the emergency department were discussed at length.  All questions answered.    Final Clinical Impressions(s) / ED Diagnoses   Final diagnoses:  Nausea  Epigastric pain    ED Discharge Orders        Ordered    sucralfate (CARAFATE) 1 g tablet  3 times daily with meals & bedtime     09/23/17 1522    promethazine  (PHENERGAN) 25 MG tablet  Every 6 hours PRN     09/23/17 1522       Kaiyla Stahly, Chase Picket, PA-C 09/23/17 1547    Gwyneth Sprout, MD 09/24/17 [phone]

## 2017-09-23 NOTE — ED Triage Notes (Addendum)
PResents with n/v/d and abdominal pain that began Thursday assocaited with chills and generalized fatigue. She last threw up Friday at 5 pm, and has thrown up multiple times. She endorses black watery stools and chills. She tried pepto without relief. She also reports some lower abdominal pain that comes and goes since Thursday.

## 2018-02-26 ENCOUNTER — Other Ambulatory Visit: Payer: Self-pay

## 2018-02-26 ENCOUNTER — Encounter (HOSPITAL_BASED_OUTPATIENT_CLINIC_OR_DEPARTMENT_OTHER): Payer: Self-pay

## 2018-02-26 ENCOUNTER — Emergency Department (HOSPITAL_BASED_OUTPATIENT_CLINIC_OR_DEPARTMENT_OTHER)
Admission: EM | Admit: 2018-02-26 | Discharge: 2018-02-26 | Disposition: A | Discharge status: Home / Self Care | Payer: BLUE CROSS/BLUE SHIELD | Attending: Emergency Medicine | Admitting: Emergency Medicine

## 2018-02-26 DIAGNOSIS — Z79899 Other long term (current) drug therapy: Secondary | ICD-10-CM | POA: Diagnosis not present

## 2018-02-26 DIAGNOSIS — E871 Hypo-osmolality and hyponatremia: Secondary | ICD-10-CM | POA: Diagnosis not present

## 2018-02-26 DIAGNOSIS — F17210 Nicotine dependence, cigarettes, uncomplicated: Secondary | ICD-10-CM | POA: Insufficient documentation

## 2018-02-26 DIAGNOSIS — I10 Essential (primary) hypertension: Secondary | ICD-10-CM | POA: Insufficient documentation

## 2018-02-26 DIAGNOSIS — E860 Dehydration: Secondary | ICD-10-CM | POA: Diagnosis not present

## 2018-02-26 DIAGNOSIS — Z9049 Acquired absence of other specified parts of digestive tract: Secondary | ICD-10-CM | POA: Diagnosis not present

## 2018-02-26 DIAGNOSIS — I252 Old myocardial infarction: Secondary | ICD-10-CM | POA: Diagnosis not present

## 2018-02-26 DIAGNOSIS — K529 Noninfective gastroenteritis and colitis, unspecified: Secondary | ICD-10-CM | POA: Insufficient documentation

## 2018-02-26 DIAGNOSIS — R112 Nausea with vomiting, unspecified: Secondary | ICD-10-CM | POA: Diagnosis present

## 2018-02-26 LAB — CBC WITH DIFFERENTIAL/PLATELET
Abs Immature Granulocytes: 0.14 10*3/uL — ABNORMAL HIGH (ref 0.00–0.07)
Basophils Absolute: 0.1 10*3/uL (ref 0.0–0.1)
Basophils Relative: 0 %
Eosinophils Absolute: 0 10*3/uL (ref 0.0–0.5)
Eosinophils Relative: 0 %
HCT: 41.1 % (ref 36.0–46.0)
Hemoglobin: 14.3 g/dL (ref 12.0–15.0)
Immature Granulocytes: 1 %
Lymphocytes Relative: 7 %
Lymphs Abs: 1.4 10*3/uL (ref 0.7–4.0)
MCH: 30.4 pg (ref 26.0–34.0)
MCHC: 34.8 g/dL (ref 30.0–36.0)
MCV: 87.3 fL (ref 80.0–100.0)
Monocytes Absolute: 1.4 10*3/uL — ABNORMAL HIGH (ref 0.1–1.0)
Monocytes Relative: 7 %
Neutro Abs: 17.6 10*3/uL — ABNORMAL HIGH (ref 1.7–7.7)
Neutrophils Relative %: 85 %
Platelets: 400 10*3/uL (ref 150–400)
RBC: 4.71 MIL/uL (ref 3.87–5.11)
RDW: 13.2 % (ref 11.5–15.5)
WBC: 20.6 10*3/uL — ABNORMAL HIGH (ref 4.0–10.5)
nRBC: 0 % (ref 0.0–0.2)

## 2018-02-26 LAB — BASIC METABOLIC PANEL
Anion gap: 9 (ref 5–15)
BUN: 14 mg/dL (ref 6–20)
CO2: 25 mmol/L (ref 22–32)
Calcium: 9.9 mg/dL (ref 8.9–10.3)
Chloride: 101 mmol/L (ref 98–111)
Creatinine, Ser: 0.79 mg/dL (ref 0.44–1.00)
GFR calc Af Amer: >60 mL/min (ref >60)
GFR calc non Af Amer: >60 mL/min (ref >60)
Glucose, Bld: 99 mg/dL (ref 70–99)
Potassium: 2.9 mmol/L — ABNORMAL LOW (ref 3.5–5.1)
Sodium: 135 mmol/L (ref 135–145)

## 2018-02-26 LAB — URINALYSIS, ROUTINE W REFLEX MICROSCOPIC
Bilirubin Urine: NEGATIVE
Glucose, UA: NEGATIVE mg/dL
Ketones, ur: >80 mg/dL — AB
Leukocytes, UA: NEGATIVE
Nitrite: NEGATIVE
Protein, ur: NEGATIVE mg/dL
Specific Gravity, Urine: 1.015 (ref 1.005–1.030)
pH: 7 (ref 5.0–8.0)

## 2018-02-26 LAB — URINALYSIS, MICROSCOPIC (REFLEX)

## 2018-02-26 MED ORDER — MORPHINE SULFATE (PF) 4 MG/ML IV SOLN
4.0000 mg | Freq: Once | INTRAVENOUS | Status: AC
  Administered 2018-02-26: 4 mg via INTRAVENOUS
  Filled 2018-02-26: qty 1

## 2018-02-26 MED ORDER — POTASSIUM CHLORIDE 10 MEQ/100ML IV SOLN
10.0000 meq | Freq: Once | INTRAVENOUS | Status: AC
  Administered 2018-02-26: 10 meq via INTRAVENOUS

## 2018-02-26 MED ORDER — KETOROLAC TROMETHAMINE 30 MG/ML IJ SOLN
30.0000 mg | Freq: Once | INTRAMUSCULAR | Status: AC
  Administered 2018-02-26: 30 mg via INTRAVENOUS
  Filled 2018-02-26: qty 1

## 2018-02-26 MED ORDER — PROMETHAZINE HCL 25 MG/ML IJ SOLN
25.0000 mg | Freq: Once | INTRAMUSCULAR | Status: AC
  Administered 2018-02-26: 25 mg via INTRAVENOUS
  Filled 2018-02-26: qty 1

## 2018-02-26 MED ORDER — PROMETHAZINE HCL 25 MG PO TABS: 25.0000 mg | ORAL_TABLET | Freq: Four times a day (QID) | ORAL | 0 refills | Status: DC | PRN

## 2018-02-26 MED ORDER — SODIUM CHLORIDE 0.9 % IV BOLUS
1000.0000 mL | Freq: Once | INTRAVENOUS | Status: AC
  Administered 2018-02-26: 1000 mL via INTRAVENOUS

## 2018-02-26 MED ORDER — METOCLOPRAMIDE HCL 5 MG/ML IJ SOLN
10.0000 mg | Freq: Once | INTRAMUSCULAR | Status: AC
  Administered 2018-02-26: 10 mg via INTRAVENOUS
  Filled 2018-02-26: qty 2

## 2018-02-26 MED ORDER — POTASSIUM CHLORIDE CRYS ER 20 MEQ PO TBCR
40.0000 meq | EXTENDED_RELEASE_TABLET | Freq: Once | ORAL | Status: AC
Start: 2018-02-26 — End: 2018-02-26
  Administered 2018-02-26: 40 meq via ORAL
  Filled 2018-02-26: qty 2

## 2018-02-26 NOTE — Discharge Instructions (Addendum)
Return here as needed slowly increase your fluid intake.  Follow-up with your primary doctor.

## 2018-02-26 NOTE — ED Triage Notes (Signed)
C/o n/v/d, weakness x 3 days-feels heart racing x 2 days-NAD-to triage in w/c

## 2018-02-26 NOTE — ED Notes (Signed)
ED Provider at bedside. 

## 2018-02-26 NOTE — ED Notes (Signed)
Po gingerale and crackers given

## 2018-02-28 ENCOUNTER — Emergency Department (HOSPITAL_BASED_OUTPATIENT_CLINIC_OR_DEPARTMENT_OTHER): Payer: BLUE CROSS/BLUE SHIELD

## 2018-02-28 ENCOUNTER — Emergency Department (HOSPITAL_BASED_OUTPATIENT_CLINIC_OR_DEPARTMENT_OTHER)
Admission: EM | Admit: 2018-02-28 | Discharge: 2018-02-28 | Disposition: A | Discharge status: Home / Self Care | Payer: BLUE CROSS/BLUE SHIELD | Attending: Emergency Medicine | Admitting: Emergency Medicine

## 2018-02-28 ENCOUNTER — Other Ambulatory Visit: Payer: Self-pay

## 2018-02-28 ENCOUNTER — Encounter (HOSPITAL_BASED_OUTPATIENT_CLINIC_OR_DEPARTMENT_OTHER): Payer: Self-pay | Admitting: Emergency Medicine

## 2018-02-28 DIAGNOSIS — Z79899 Other long term (current) drug therapy: Secondary | ICD-10-CM | POA: Diagnosis not present

## 2018-02-28 DIAGNOSIS — F17210 Nicotine dependence, cigarettes, uncomplicated: Secondary | ICD-10-CM | POA: Diagnosis not present

## 2018-02-28 DIAGNOSIS — E876 Hypokalemia: Secondary | ICD-10-CM | POA: Diagnosis not present

## 2018-02-28 DIAGNOSIS — R1084 Generalized abdominal pain: Secondary | ICD-10-CM | POA: Diagnosis present

## 2018-02-28 DIAGNOSIS — I10 Essential (primary) hypertension: Secondary | ICD-10-CM | POA: Diagnosis not present

## 2018-02-28 DIAGNOSIS — R197 Diarrhea, unspecified: Secondary | ICD-10-CM

## 2018-02-28 DIAGNOSIS — R109 Unspecified abdominal pain: Secondary | ICD-10-CM

## 2018-02-28 DIAGNOSIS — R112 Nausea with vomiting, unspecified: Secondary | ICD-10-CM | POA: Insufficient documentation

## 2018-02-28 LAB — COMPREHENSIVE METABOLIC PANEL
ALBUMIN: 4 g/dL (ref 3.5–5.0)
ALT: 12 U/L (ref 0–44)
ANION GAP: 11 (ref 5–15)
AST: 17 U/L (ref 15–41)
Alkaline Phosphatase: 61 U/L (ref 38–126)
BUN: 15 mg/dL (ref 6–20)
CALCIUM: 9.5 mg/dL (ref 8.9–10.3)
CHLORIDE: 100 mmol/L (ref 98–111)
CO2: 23 mmol/L (ref 22–32)
Creatinine, Ser: 0.83 mg/dL (ref 0.44–1.00)
GFR calc non Af Amer: >60 mL/min (ref >60)
Glucose, Bld: 99 mg/dL (ref 70–99)
Potassium: 3.4 mmol/L — ABNORMAL LOW (ref 3.5–5.1)
SODIUM: 134 mmol/L — AB (ref 135–145)
Total Bilirubin: 1.1 mg/dL (ref 0.3–1.2)
Total Protein: 7.3 g/dL (ref 6.5–8.1)

## 2018-02-28 LAB — CBC WITH DIFFERENTIAL/PLATELET
ABS IMMATURE GRANULOCYTES: 0.09 10*3/uL — AB (ref 0.00–0.07)
Basophils Absolute: 0.1 10*3/uL (ref 0.0–0.1)
Basophils Relative: 0 %
EOS ABS: 0.1 10*3/uL (ref 0.0–0.5)
Eosinophils Relative: 1 %
HEMATOCRIT: 40.3 % (ref 36.0–46.0)
Hemoglobin: 14.1 g/dL (ref 12.0–15.0)
Immature Granulocytes: 1 %
LYMPHS PCT: 13 %
Lymphs Abs: 1.9 10*3/uL (ref 0.7–4.0)
MCH: 30 pg (ref 26.0–34.0)
MCHC: 35 g/dL (ref 30.0–36.0)
MCV: 85.7 fL (ref 80.0–100.0)
MONO ABS: 0.9 10*3/uL (ref 0.1–1.0)
MONOS PCT: 6 %
NEUTROS ABS: 12.2 10*3/uL — AB (ref 1.7–7.7)
NRBC: 0 % (ref 0.0–0.2)
Neutrophils Relative %: 79 %
Platelets: 396 10*3/uL (ref 150–400)
RBC: 4.7 MIL/uL (ref 3.87–5.11)
RDW: 13.2 % (ref 11.5–15.5)
WBC: 15.3 10*3/uL — AB (ref 4.0–10.5)

## 2018-02-28 LAB — LIPASE, BLOOD: LIPASE: 40 U/L (ref 11–51)

## 2018-02-28 LAB — URINALYSIS, ROUTINE W REFLEX MICROSCOPIC
Bilirubin Urine: NEGATIVE
GLUCOSE, UA: NEGATIVE mg/dL
Ketones, ur: 15 mg/dL — AB
LEUKOCYTES UA: NEGATIVE
Nitrite: NEGATIVE
PH: 7.5 (ref 5.0–8.0)
Protein, ur: NEGATIVE mg/dL
Specific Gravity, Urine: 1.02 (ref 1.005–1.030)

## 2018-02-28 LAB — URINALYSIS, MICROSCOPIC (REFLEX)

## 2018-02-28 LAB — PHOSPHORUS: Phosphorus: 3 mg/dL (ref 2.5–4.6)

## 2018-02-28 LAB — MAGNESIUM: Magnesium: 1.8 mg/dL (ref 1.7–2.4)

## 2018-02-28 LAB — PREGNANCY, URINE: PREG TEST UR: NEGATIVE

## 2018-02-28 MED ORDER — SODIUM CHLORIDE 0.9 % IV SOLN: 1000.0000 mL | INTRAVENOUS | Status: DC

## 2018-02-28 MED ORDER — SODIUM CHLORIDE 0.9 % IV SOLN
INTRAVENOUS | Status: DC | PRN
  Administered 2018-02-28: 08:00:00 via INTRAVENOUS

## 2018-02-28 MED ORDER — FAMOTIDINE 20 MG PO TABS: 20.0000 mg | ORAL_TABLET | Freq: Two times a day (BID) | ORAL | 0 refills | Status: DC

## 2018-02-28 MED ORDER — POTASSIUM CHLORIDE CRYS ER 20 MEQ PO TBCR: 20.0000 meq | EXTENDED_RELEASE_TABLET | Freq: Every day | ORAL | 0 refills | Status: DC

## 2018-02-28 MED ORDER — POTASSIUM CHLORIDE CRYS ER 20 MEQ PO TBCR
30.0000 meq | EXTENDED_RELEASE_TABLET | Freq: Once | ORAL | Status: AC
  Administered 2018-02-28: 30 meq via ORAL

## 2018-02-28 MED ORDER — POTASSIUM CHLORIDE CRYS ER 20 MEQ PO TBCR
EXTENDED_RELEASE_TABLET | ORAL | Status: AC
  Filled 2018-02-28: qty 2

## 2018-02-28 MED ORDER — BARIUM SULFATE 2 % PO SUSP
450.0000 mL | Freq: Once | ORAL | Status: AC
  Administered 2018-02-28: 450 mL via ORAL

## 2018-02-28 MED ORDER — PROMETHAZINE HCL 25 MG/ML IJ SOLN
25.0000 mg | Freq: Once | INTRAMUSCULAR | Status: AC
  Administered 2018-02-28: 25 mg via INTRAVENOUS
  Filled 2018-02-28: qty 1

## 2018-02-28 MED ORDER — SODIUM CHLORIDE 0.9 % IV BOLUS (SEPSIS)
1000.0000 mL | Freq: Once | INTRAVENOUS | Status: AC
  Administered 2018-02-28: 1000 mL via INTRAVENOUS

## 2018-02-28 MED ORDER — SUCRALFATE 1 GM/10ML PO SUSP: 1.0000 g | Freq: Three times a day (TID) | ORAL | 0 refills | Status: DC

## 2018-02-28 MED ORDER — GI COCKTAIL ~~LOC~~
30.0000 mL | Freq: Once | ORAL | Status: AC
  Administered 2018-02-28: 30 mL via ORAL
  Filled 2018-02-28: qty 30

## 2018-02-28 MED ORDER — FAMOTIDINE IN NACL 20-0.9 MG/50ML-% IV SOLN
20.0000 mg | Freq: Once | INTRAVENOUS | Status: AC
  Administered 2018-02-28: 20 mg via INTRAVENOUS
  Filled 2018-02-28: qty 50

## 2018-02-28 NOTE — ED Triage Notes (Signed)
Continued N/V since her last visit on Monday. She was diagnosed with gastroenteritis.

## 2018-02-28 NOTE — ED Provider Notes (Addendum)
[facility] EMERGENCY DEPARTMENT Provider Note   CSN: [id number] Arrival date & time: 02/26/18  1342     History   Chief Complaint Chief Complaint  Patient presents with  . Weakness    HPI Paige Woodward is a 47 y.o. female.  HPI Patient presents to the emergency department with nausea vomiting and diarrhea that started yesterday.  The patient states that she does have some abdominal discomfort but her symptoms are mostly the nausea and vomiting.  Patient states that nothing seems to make the condition better or worse.  The patient states she did not take any medications prior to arrival.  She does complain of body aches as well.  The patient denies chest pain, shortness of breath, headache,blurred vision, neck pain, fever, cough, numbness, dizziness, anorexia, edema, rash, back pain, dysuria, hematemesis, bloody stool, near syncope, or syncope. Past Medical History:  Diagnosis Date  . Fibromyalgia   . GERD (gastroesophageal reflux disease)   . Hypertension   . MI (myocardial infarction) (HCC)   . Ulcerative colitis (HCC)     There are no active problems to display for this patient.   Past Surgical History:  Procedure Laterality Date  . ABDOMINAL HYSTERECTOMY    . APPENDECTOMY    . CARDIAC CATHETERIZATION    . CHOLECYSTECTOMY    . EYE SURGERY    . SPLENECTOMY       OB History   None      Home Medications    Prior to Admission medications   Medication Sig Start Date End Date Taking? Authorizing Provider  amLODipine (NORVASC) 10 MG tablet Take by mouth.    [provider]  AMLODIPINE BESYLATE PO Take by mouth.    [provider]  atenolol (TENORMIN) 100 MG tablet Take by mouth.    [provider]  ATENOLOL PO Take by mouth.    [provider]  benzonatate (TESSALON) 100 MG capsule Take 1 capsule (100 mg total) by mouth every 8 (eight) hours. 04/28/15   Garlon Hatchet, PA-C  Dexlansoprazole (DEXILANT PO) Take by  mouth.    [provider]  HydrOXYzine Pamoate (VISTARIL PO) Take by mouth.    [provider]  losartan (COZAAR) 50 MG tablet Take by mouth. 04/07/15   [provider]  LOSARTAN POTASSIUM PO Take by mouth.    [provider]  OMEPRAZOLE PO Take by mouth.    [provider]  oxyCODONE (ROXICODONE) 15 MG immediate release tablet Take 15 mg by mouth every 4 (four) hours as needed for pain.    [provider]  oxyCODONE (ROXICODONE) 15 MG immediate release tablet Take by mouth. 09/17/17   [provider]  pantoprazole (PROTONIX) 40 MG tablet Take by mouth. 09/06/16   [provider]  Pregabalin (LYRICA PO) Take by mouth.    [provider]  promethazine (PHENERGAN) 25 MG tablet Take 1 tablet (25 mg total) by mouth every 6 (six) hours as needed for nausea or vomiting. 02/26/18   Joaquim Tolen, Cristal Deer, PA-C  Rosuvastatin Calcium (CRESTOR PO) Take by mouth.    [provider]  sucralfate (CARAFATE) 1 g tablet Take 1 tablet (1 g total) by mouth 4 (four) times daily -  with meals and at bedtime. 09/23/17   Ward, Chase Picket, PA-C    Family History No family history on file.  Social History Social History   Tobacco Use  . Smoking status: Current Every Day Smoker    Types: Cigarettes  .  Smokeless tobacco: Never Used  Substance Use Topics  . Alcohol use: No  . Drug use: No     Allergies   Ivp dye [iodinated diagnostic agents]; Lipitor [atorvastatin]; and Penicillins   Review of Systems Review of Systems  All other systems negative except as documented in the HPI. All pertinent positives and negatives as reviewed in the HPI. Physical Exam Updated Vital Signs BP (!) 167/86   Pulse 62 Comment: Simultaneous filing. User may not have seen previous data.  Temp 99.4 F (37.4 C)   Resp 10   Ht 5\' 7"  (1.702 m)   Wt 68 kg   SpO2 100% Comment: Simultaneous filing. User may not have seen previous data.  BMI  23.49 kg/m   Physical Exam  Constitutional: She is oriented to person, place, and time. She appears well-developed and well-nourished. No distress.  HENT:  Head: Normocephalic and atraumatic.  Mouth/Throat: Oropharynx is clear and moist.  Eyes: Pupils are equal, round, and reactive to light.  Neck: Normal range of motion. Neck supple.  Cardiovascular: Normal rate, regular rhythm and normal heart sounds. Exam reveals no gallop and no friction rub.  No murmur heard. Pulmonary/Chest: Effort normal and breath sounds normal. No respiratory distress. She has no wheezes.  Abdominal: Soft. Bowel sounds are normal. She exhibits no distension and no mass. There is tenderness. There is no guarding.  Neurological: She is alert and oriented to person, place, and time. She exhibits normal muscle tone. Coordination normal.  Skin: Skin is warm and dry. Capillary refill takes less than 2 seconds. No rash noted. No erythema.  Psychiatric: She has a normal mood and affect. Her behavior is normal.  Nursing note and vitals reviewed.    ED Treatments / Results  Labs (all labs ordered are listed, but only abnormal results are displayed) Labs Reviewed  BASIC METABOLIC PANEL - Abnormal; Notable for the following components:      Result Value   Potassium 2.9 (*)    All other components within normal limits  CBC WITH DIFFERENTIAL/PLATELET - Abnormal; Notable for the following components:   WBC 20.6 (*)    Neutro Abs 17.6 (*)    Monocytes Absolute 1.4 (*)    Abs Immature Granulocytes 0.14 (*)    All other components within normal limits  URINALYSIS, ROUTINE W REFLEX MICROSCOPIC - Abnormal; Notable for the following components:   Hgb urine dipstick LARGE (*)    Ketones, ur >80 (*)    All other components within normal limits  URINALYSIS, MICROSCOPIC (REFLEX) - Abnormal; Notable for the following components:   Bacteria, UA FEW (*)    All other components within normal limits    EKG EKG  Interpretation  Date/Time:  Monday February 26 2018 13:53:05 EDT Ventricular Rate:  63 PR Interval:  126 QRS Duration: 84 QT Interval:  392 QTC Calculation: 401 R Axis:   70 Text Interpretation:  Normal sinus rhythm Normal ECG No acute changes No significant change since last tracing Confirmed by Derwood Kaplan [phone]) on 02/27/2018 3:35:26 PM   Radiology No results found.  Procedures Procedures (including critical care time)  Medications Ordered in ED Medications  sodium chloride 0.9 % bolus 1,000 mL ( Intravenous Stopped 02/26/18 1605)  promethazine (PHENERGAN) injection 25 mg (25 mg Intravenous Given 02/26/18 1546)  sodium chloride 0.9 % bolus 1,000 mL ( Intravenous Stopped 02/26/18 1805)  metoCLOPramide (REGLAN) injection 10 mg (10 mg Intravenous Given 02/26/18 1704)  ketorolac (TORADOL) 30 MG/ML injection 30 mg (30 mg  Intravenous Given 02/26/18 1704)  morphine 4 MG/ML injection 4 mg (4 mg Intravenous Given 02/26/18 1829)  potassium chloride 10 mEq in 100 mL IVPB ( Intravenous Stopped 02/26/18 1939)  potassium chloride SA (K-DUR,KLOR-CON) CR tablet 40 mEq (40 mEq Oral Given 02/26/18 1829)     Initial Impression / Assessment and Plan / ED Course  I have reviewed the triage vital signs and the nursing notes.  Pertinent labs & imaging results that were available during my care of the patient were reviewed by me and considered in my medical decision making (see chart for details).    Patient was given IV fluids and antiemetics.  The patient has been stable here in the emergency department her testing does not show any significant abnormality and her CT scan does not show any significant abnormality.  Patient will be advised to return here as needed.  Told to follow-up with her primary doctor as well.  I feel that she has a gastroenteritis that is led to her symptoms that was caused by dehydration.  Final Clinical Impressions(s) / ED Diagnoses   Final diagnoses:  Dehydration   Gastroenteritis  Hyponatremia    ED Discharge Orders         Ordered    promethazine (PHENERGAN) 25 MG tablet  Every 6 hours PRN     02/26/18 2014           Charlestine Night, PA-C 02/28/18 0009    Charlestine Night, PA-C 02/28/18 0009    Gwyneth Sprout, MD 02/28/18 1912

## 2018-02-28 NOTE — Discharge Instructions (Signed)
1.  Make an appointment to see her gastroenterologist as soon as possible. 2.  Try to stay hydrated by keeping fluids at your bedside and taking frequent small sips. 3.  Add Carafate and Pepcid to your reflux medications.  Take Phenergan if needed for nausea and vomiting. 4.  Return to the emergency department for symptoms are worsening.

## 2018-02-28 NOTE — ED Provider Notes (Signed)
[facility] EMERGENCY DEPARTMENT Provider Note   CSN: [id number] Arrival date & time: 02/28/18  4098     History   Chief Complaint Chief Complaint  Patient presents with  . Emesis    HPI Paige Woodward is a 47 y.o. female.  HPI Patient reports she been sick for 5 days.  On first day, Saturday, she had both vomiting and diarrhea.  She reports on subsequent days she has had vomiting with no diarrhea.  She has burning and aching pain in the left and mid abdomen.  Reports she has been trying to take in fluids.  Intermittently, she is tolerating fluids.  She reports last night she tried to have some dry toast but vomited afterwards.  Patient reports that about a week ago she was treated with 3 days of ciprofloxacin for UTI. No  Sick contacts that she is aware of.  She reports she takes daily Protonix.  Patient has been sexually active with the same partner for 10 years. Past Medical History:  Diagnosis Date  . Fibromyalgia   . GERD (gastroesophageal reflux disease)   . Hypertension   . MI (myocardial infarction) (HCC)   . Ulcerative colitis (HCC)     There are no active problems to display for this patient.   Past Surgical History:  Procedure Laterality Date  . ABDOMINAL HYSTERECTOMY    . APPENDECTOMY    . CARDIAC CATHETERIZATION    . CHOLECYSTECTOMY    . EYE SURGERY    . SPLENECTOMY       OB History   None      Home Medications    Prior to Admission medications   Medication Sig Start Date End Date Taking? Authorizing Provider  atenolol (TENORMIN) 100 MG tablet Take by mouth.   Yes [provider]  busPIRone (BUSPAR) 30 MG tablet Take 30 mg by mouth 2 (two) times daily.   Yes [provider]  losartan (COZAAR) 50 MG tablet Take by mouth. 04/07/15  Yes [provider]  oxyCODONE (ROXICODONE) 15 MG immediate release tablet Take 15 mg by mouth every 4 (four) hours as needed for pain.   Yes [provider]  amLODipine  (NORVASC) 10 MG tablet Take by mouth.    [provider]  AMLODIPINE BESYLATE PO Take by mouth.    [provider]  ATENOLOL PO Take by mouth.    [provider]  benzonatate (TESSALON) 100 MG capsule Take 1 capsule (100 mg total) by mouth every 8 (eight) hours. 04/28/15   Garlon Hatchet, PA-C  Dexlansoprazole (DEXILANT PO) Take by mouth.    [provider]  famotidine (PEPCID) 20 MG tablet Take 1 tablet (20 mg total) by mouth 2 (two) times daily. 02/28/18   Arby Barrette, MD  HydrOXYzine Pamoate (VISTARIL PO) Take by mouth.    [provider]  LOSARTAN POTASSIUM PO Take by mouth.    [provider]  OMEPRAZOLE PO Take by mouth.    [provider]  oxyCODONE (ROXICODONE) 15 MG immediate release tablet Take by mouth. 09/17/17   [provider]  pantoprazole (PROTONIX) 40 MG tablet Take by mouth. 09/06/16   [provider]  potassium chloride SA (K-DUR,KLOR-CON) 20 MEQ tablet Take 1 tablet (20 mEq total) by mouth daily. 02/28/18   Arby Barrette, MD  Pregabalin (LYRICA PO) Take by mouth.    [provider]  promethazine (PHENERGAN) 25 MG tablet Take 1 tablet (25 mg total) by mouth every 6 (six)  hours as needed for nausea or vomiting. 02/26/18   Lawyer, Cristal Deer, PA-C  Rosuvastatin Calcium (CRESTOR PO) Take by mouth.    [provider]  sucralfate (CARAFATE) 1 g tablet Take 1 tablet (1 g total) by mouth 4 (four) times daily -  with meals and at bedtime. 09/23/17   Ward, Chase Picket, PA-C  sucralfate (CARAFATE) 1 GM/10ML suspension Take 10 mLs (1 g total) by mouth 4 (four) times daily -  with meals and at bedtime. 02/28/18   Arby Barrette, MD    Family History No family history on file.  Social History Social History   Tobacco Use  . Smoking status: Current Every Day Smoker    Types: Cigarettes  . Smokeless tobacco: Never Used  Substance Use Topics  . Alcohol use: No  . Drug use: No      Allergies   Ivp dye [iodinated diagnostic agents]; Lipitor [atorvastatin]; and Penicillins   Review of Systems Review of Systems 10 Systems reviewed and are negative for acute change except as noted in the HPI.  Physical Exam Updated Vital Signs BP (!) 162/90 (BP Location: Right Arm)   Pulse (!) 57   Temp 98.4 F (36.9 C) (Oral)   Resp 16   Ht 5\' 7"  (1.702 m)   Wt 68 kg   SpO2 99%   BMI 23.49 kg/m   Physical Exam  Constitutional: She is oriented to person, place, and time.  Patient is thin.  She is alert and nontoxic.  Mildly ill in appearance.  No respiratory distress.  HENT:  Head: Normocephalic and atraumatic.  Nose: Nose normal.  Mouth/Throat: Oropharynx is clear and moist.  Eyes: EOM are normal.  Patient has a baseline external strabismus on the right.  Neck: Neck supple.  Cardiovascular: Normal rate, regular rhythm, normal heart sounds and intact distal pulses.  Pulmonary/Chest: Effort normal and breath sounds normal.  Abdominal:  Abdomen is soft.  Patient has moderate tenderness to the left lateral and mid abdomen.  No guarding.  Musculoskeletal: Normal range of motion. She exhibits no edema or tenderness.  Neurological: She is alert and oriented to person, place, and time. She exhibits normal muscle tone. Coordination normal.  Skin: Skin is warm and dry.  Psychiatric: She has a normal mood and affect.     ED Treatments / Results  Labs (all labs ordered are listed, but only abnormal results are displayed) Labs Reviewed  COMPREHENSIVE METABOLIC PANEL - Abnormal; Notable for the following components:      Result Value   Sodium 134 (*)    Potassium 3.4 (*)    All other components within normal limits  CBC WITH DIFFERENTIAL/PLATELET - Abnormal; Notable for the following components:   WBC 15.3 (*)    Neutro Abs 12.2 (*)    Abs Immature Granulocytes 0.09 (*)    All other components within normal limits  URINALYSIS, ROUTINE W REFLEX MICROSCOPIC -  Abnormal; Notable for the following components:   Hgb urine dipstick MODERATE (*)    Ketones, ur 15 (*)    All other components within normal limits  URINALYSIS, MICROSCOPIC (REFLEX) - Abnormal; Notable for the following components:   Bacteria, UA FEW (*)    All other components within normal limits  LIPASE, BLOOD  MAGNESIUM  PHOSPHORUS  PREGNANCY, URINE    EKG None  Radiology Ct Abdomen Pelvis Wo Contrast  Result Date: 02/28/2018 CLINICAL DATA:  Nausea and vomiting.  Back pain. EXAM: CT ABDOMEN AND PELVIS WITHOUT CONTRAST TECHNIQUE: Multidetector  CT imaging of the abdomen and pelvis was performed following the standard protocol without IV contrast. COMPARISON:  10/10/2016 FINDINGS: Lower chest: No acute abnormality. Hepatobiliary: No focal liver abnormality is seen. Status post cholecystectomy. No biliary dilatation. Pancreas: Unremarkable. No pancreatic ductal dilatation or surrounding inflammatory changes. Spleen: Previous splenectomy. Adrenals/Urinary Tract: Normal appearance of the adrenal glands. No kidney stones or hydronephrosis identified. The urinary bladder appears within normal limits. Stomach/Bowel: The stomach appears nondistended. No abnormal small bowel dilatation identified. Previous appendectomy. No pathologic dilatation of the colon. Vascular/Lymphatic: Mild aortic atherosclerosis. No aneurysm. No abdominal no pelvic adenopathy. Reproductive: Status post hysterectomy. Cyst in right ovary measures 2.6 cm. No adnexal mass identified. Other: No free fluid within the abdomen or pelvis. Musculoskeletal: No acute or significant osseous findings. IMPRESSION: 1. No acute findings within the abdomen or pelvis. 2. Status post cholecystectomy, appendectomy and hysterectomy 3.  Aortic Atherosclerosis (ICD10-I70.0). Electronically Signed   By: Signa Kell M.D.   On: 02/28/2018 10:39    Procedures Procedures (including critical care time) No Critical care time Medications Ordered in  ED Medications  sodium chloride 0.9 % bolus 1,000 mL (0 mLs Intravenous Stopped 02/28/18 1043)    Followed by  0.9 %  sodium chloride infusion (1,000 mLs Intravenous Not Given 02/28/18 0745)  0.9 %  sodium chloride infusion ( Intravenous Stopped 02/28/18 1216)  promethazine (PHENERGAN) injection 25 mg (25 mg Intravenous Given 02/28/18 0806)  famotidine (PEPCID) IVPB 20 mg premix (0 mg Intravenous Stopped 02/28/18 1043)  barium (READI-CAT 2) 2 % suspension 450 mL (450 mLs Oral Given 02/28/18 0904)  gi cocktail (Maalox,Lidocaine,Donnatal) (30 mLs Oral Given 02/28/18 1117)  potassium chloride SA (K-DUR,KLOR-CON) CR tablet 30 mEq (30 mEq Oral Given 02/28/18 1212)     Initial Impression / Assessment and Plan / ED Course  I have reviewed the triage vital signs and the nursing notes.  Pertinent labs & imaging results that were available during my care of the patient were reviewed by me and considered in my medical decision making (see chart for details).    Patient is having persisting symptoms of vomiting and diarrhea.  Is these are occurring at less than 4 episodes per day.  There have been some days with only one episode each.  Patient however continues to feel unwell.  Patient is nontoxic.  She did have CT abdomen today which did not reveal any acute findings.  Patient's leukocytosis has decreased since last evaluation.  At this time, with no apparent surgical etiology and frequency of vomiting and diarrhea seemingly decreasing, I do feel patient is stable for discharge again today.  She has been rehydrated and treated for nausea.  She is counseled to follow-up with her gastroenterologist ASAP for recheck.  She will add Pepcid and Carafate to her Nexium as burning and reflux type symptoms appear to be a prominent feature.  Reports she has had severe reflux and been prescribed Dexilant in the past but found that it was too expensive.  Patient is counseled on home management plan and return  precautions reviewed.  Final Clinical Impressions(s) / ED Diagnoses   Final diagnoses:  Nausea vomiting and diarrhea  Abdominal pain, unspecified abdominal location  Hypokalemia    ED Discharge Orders         Ordered    sucralfate (CARAFATE) 1 GM/10ML suspension  3 times daily with meals & bedtime     02/28/18 1250    famotidine (PEPCID) 20 MG tablet  2 times daily  02/28/18 1250    potassium chloride SA (K-DUR,KLOR-CON) 20 MEQ tablet  Daily     02/28/18 1250    Gastrointestinal Panel by PCR , Stool     02/28/18 1252    Clostridium Difficile by PCR([facility])  Status:  Canceled     02/28/18 1252           Arby Barrette, MD 02/28/18 1259

## 2018-03-01 ENCOUNTER — Other Ambulatory Visit: Payer: Self-pay

## 2018-03-01 ENCOUNTER — Emergency Department (HOSPITAL_BASED_OUTPATIENT_CLINIC_OR_DEPARTMENT_OTHER): Payer: BLUE CROSS/BLUE SHIELD

## 2018-03-01 ENCOUNTER — Emergency Department (HOSPITAL_BASED_OUTPATIENT_CLINIC_OR_DEPARTMENT_OTHER)
Admission: EM | Admit: 2018-03-01 | Discharge: 2018-03-01 | Disposition: A | Discharge status: Home / Self Care | Payer: BLUE CROSS/BLUE SHIELD | Attending: Emergency Medicine | Admitting: Emergency Medicine

## 2018-03-01 ENCOUNTER — Encounter (HOSPITAL_BASED_OUTPATIENT_CLINIC_OR_DEPARTMENT_OTHER): Payer: Self-pay | Admitting: *Deleted

## 2018-03-01 DIAGNOSIS — I252 Old myocardial infarction: Secondary | ICD-10-CM | POA: Insufficient documentation

## 2018-03-01 DIAGNOSIS — R079 Chest pain, unspecified: Secondary | ICD-10-CM | POA: Diagnosis not present

## 2018-03-01 DIAGNOSIS — F17210 Nicotine dependence, cigarettes, uncomplicated: Secondary | ICD-10-CM | POA: Diagnosis not present

## 2018-03-01 DIAGNOSIS — Z79899 Other long term (current) drug therapy: Secondary | ICD-10-CM | POA: Insufficient documentation

## 2018-03-01 DIAGNOSIS — K30 Functional dyspepsia: Secondary | ICD-10-CM

## 2018-03-01 DIAGNOSIS — Z9049 Acquired absence of other specified parts of digestive tract: Secondary | ICD-10-CM | POA: Diagnosis not present

## 2018-03-01 DIAGNOSIS — I10 Essential (primary) hypertension: Secondary | ICD-10-CM | POA: Insufficient documentation

## 2018-03-01 LAB — CBC WITH DIFFERENTIAL/PLATELET
ABS IMMATURE GRANULOCYTES: 0.08 10*3/uL — AB (ref 0.00–0.07)
Basophils Absolute: 0 10*3/uL (ref 0.0–0.1)
Basophils Relative: 0 %
Eosinophils Absolute: 0.1 10*3/uL (ref 0.0–0.5)
Eosinophils Relative: 1 %
HCT: 40.4 % (ref 36.0–46.0)
HEMOGLOBIN: 14.1 g/dL (ref 12.0–15.0)
Immature Granulocytes: 1 %
LYMPHS PCT: 20 %
Lymphs Abs: 2.2 10*3/uL (ref 0.7–4.0)
MCH: 30.2 pg (ref 26.0–34.0)
MCHC: 34.9 g/dL (ref 30.0–36.0)
MCV: 86.5 fL (ref 80.0–100.0)
MONO ABS: 0.7 10*3/uL (ref 0.1–1.0)
MONOS PCT: 7 %
NEUTROS ABS: 7.8 10*3/uL — AB (ref 1.7–7.7)
Neutrophils Relative %: 71 %
Platelets: 400 10*3/uL (ref 150–400)
RBC: 4.67 MIL/uL (ref 3.87–5.11)
RDW: 13 % (ref 11.5–15.5)
WBC: 11 10*3/uL — ABNORMAL HIGH (ref 4.0–10.5)
nRBC: 0 % (ref 0.0–0.2)

## 2018-03-01 LAB — COMPREHENSIVE METABOLIC PANEL
ALT: 12 U/L (ref 0–44)
AST: 16 U/L (ref 15–41)
Albumin: 4 g/dL (ref 3.5–5.0)
Alkaline Phosphatase: 66 U/L (ref 38–126)
Anion gap: 9 (ref 5–15)
BUN: 21 mg/dL — ABNORMAL HIGH (ref 6–20)
CALCIUM: 9.4 mg/dL (ref 8.9–10.3)
CO2: 24 mmol/L (ref 22–32)
CREATININE: 0.97 mg/dL (ref 0.44–1.00)
Chloride: 102 mmol/L (ref 98–111)
GFR calc non Af Amer: >60 mL/min (ref >60)
Glucose, Bld: 96 mg/dL (ref 70–99)
Potassium: 4.1 mmol/L (ref 3.5–5.1)
SODIUM: 135 mmol/L (ref 135–145)
Total Bilirubin: 1.1 mg/dL (ref 0.3–1.2)
Total Protein: 7.2 g/dL (ref 6.5–8.1)

## 2018-03-01 LAB — TROPONIN I
Troponin I: 0.03 ng/mL (ref <0.03)
Troponin I: <0.03 ng/mL (ref <0.03)

## 2018-03-01 LAB — LIPASE, BLOOD: LIPASE: 37 U/L (ref 11–51)

## 2018-03-01 MED ORDER — PROMETHAZINE HCL 25 MG PO TABS
25.0000 mg | ORAL_TABLET | Freq: Once | ORAL | Status: AC
  Administered 2018-03-01: 25 mg via ORAL
  Filled 2018-03-01: qty 1

## 2018-03-01 MED ORDER — GI COCKTAIL ~~LOC~~
30.0000 mL | Freq: Once | ORAL | Status: AC
  Administered 2018-03-01: 30 mL via ORAL
  Filled 2018-03-01: qty 30

## 2018-03-01 MED ORDER — MORPHINE SULFATE (PF) 2 MG/ML IV SOLN
2.0000 mg | Freq: Once | INTRAVENOUS | Status: AC
  Administered 2018-03-01: 2 mg via INTRAMUSCULAR
  Filled 2018-03-01: qty 1

## 2018-03-01 MED ORDER — GI COCKTAIL ~~LOC~~: 30.0000 mL | Freq: Once | ORAL | Status: DC

## 2018-03-01 NOTE — Discharge Instructions (Signed)
Your blood work, Publishing copy and chest xray were reassuring today.   Please keep taking nexium, carafate and phenergan as needed.   Follow up with your stomach doctor for recheck.   Return to the ER if you have any new or worsening symptoms like trouble breathing, chest pain that worsens, chest pain that radiates to the left arm or jaw, chest pain with sweating.

## 2018-03-01 NOTE — ED Notes (Signed)
Patient transported to X-ray 

## 2018-03-01 NOTE — ED Triage Notes (Signed)
Chest pain and feeling like a hard beat.  Has been here at ED x 2 (Monday and Wednesday) with the same problem).

## 2018-03-01 NOTE — ED Provider Notes (Signed)
[facility] EMERGENCY DEPARTMENT Provider Note   CSN: [id number] Arrival date & time: 03/01/18  1018     History   Chief Complaint Chief Complaint  Patient presents with  . Chest Pain    HPI Paige Woodward is a 47 y.o. female.  HPI   Paige Woodward is a 47yo female with a history of hypertension and prior MI who presents to the emergency department for evaluation of indigestion, left-sided chest pain and chills.  She reports that 3 days ago she had vomiting and diarrhea with feeling some epigastric indigestion and burping.  Then 2 days ago she developed left sided intermittent nonradiating chest pain which feels like a "hard beat."  No associated triggers.  She denies shortness of breath, but states that it feels as if she has to breathe heavily. The deep breaths improve her pain. She had some nausea this morning and took Phenergan and Nexium and no longer has nausea.  She also took some oxycodone which did not improve her chest discomfort.  She has had chills, but no known fever.  She reports that she has had 2 heart attacks in the past, but has never required a stent or open heart surgery.  She has not seen a cardiologist in several years.  She does endorse daily tobacco use.  Positive family history for MI.  She denies history of DVT/PE, hemoptysis, unilateral leg swelling or calf tenderness, active cancer, recent surgery or immobilization, exogenous hormone use.  She denies diaphoresis, headache, sore throat, cough, congestion, lightheadedness, syncope, dizziness, abdominal pain outside the epigastric discomfort/bloating.  Past Medical History:  Diagnosis Date  . Fibromyalgia   . GERD (gastroesophageal reflux disease)   . Hypertension   . MI (myocardial infarction) (HCC)   . Ulcerative colitis (HCC)     There are no active problems to display for this patient.   Past Surgical History:  Procedure Laterality Date  . ABDOMINAL HYSTERECTOMY    . APPENDECTOMY    .  CARDIAC CATHETERIZATION    . CHOLECYSTECTOMY    . EYE SURGERY    . SPLENECTOMY       OB History   None      Home Medications    Prior to Admission medications   Medication Sig Start Date End Date Taking? Authorizing Provider  OMEPRAZOLE PO Take by mouth.   Yes [provider]  oxyCODONE (ROXICODONE) 15 MG immediate release tablet Take 15 mg by mouth every 4 (four) hours as needed for pain.   Yes [provider]  oxyCODONE (ROXICODONE) 15 MG immediate release tablet Take by mouth. 09/17/17  Yes [provider]  sucralfate (CARAFATE) 1 GM/10ML suspension Take 10 mLs (1 g total) by mouth 4 (four) times daily -  with meals and at bedtime. 02/28/18  Yes Pfeiffer, Lebron Conners, MD  amLODipine (NORVASC) 10 MG tablet Take by mouth.    [provider]  AMLODIPINE BESYLATE PO Take by mouth.    [provider]  atenolol (TENORMIN) 100 MG tablet Take by mouth.    [provider]  ATENOLOL PO Take by mouth.    [provider]  benzonatate (TESSALON) 100 MG capsule Take 1 capsule (100 mg total) by mouth every 8 (eight) hours. 04/28/15   Garlon Hatchet, PA-C  busPIRone (BUSPAR) 30 MG tablet Take 30 mg by mouth 2 (two) times daily.    [provider]  Dexlansoprazole (DEXILANT PO) Take by mouth.    [provider]  famotidine (PEPCID)  20 MG tablet Take 1 tablet (20 mg total) by mouth 2 (two) times daily. 02/28/18   Arby Barrette, MD  HydrOXYzine Pamoate (VISTARIL PO) Take by mouth.    [provider]  losartan (COZAAR) 50 MG tablet Take by mouth. 04/07/15   [provider]  LOSARTAN POTASSIUM PO Take by mouth.    [provider]  pantoprazole (PROTONIX) 40 MG tablet Take by mouth. 09/06/16   [provider]  potassium chloride SA (K-DUR,KLOR-CON) 20 MEQ tablet Take 1 tablet (20 mEq total) by mouth daily. 02/28/18   Arby Barrette, MD  Pregabalin (LYRICA PO) Take by mouth.    [provider]  promethazine (PHENERGAN) 25 MG tablet Take 1 tablet (25 mg total) by mouth every 6 (six) hours as needed for nausea or vomiting. 02/26/18   Lawyer, Cristal Deer, PA-C  Rosuvastatin Calcium (CRESTOR PO) Take by mouth.    [provider]  sucralfate (CARAFATE) 1 g tablet Take 1 tablet (1 g total) by mouth 4 (four) times daily -  with meals and at bedtime. 09/23/17   Ward, Chase Picket, PA-C    Family History Family History  Problem Relation Age of Onset  . Stroke Father     Social History Social History   Tobacco Use  . Smoking status: Current Every Day Smoker    Types: Cigarettes  . Smokeless tobacco: Never Used  Substance Use Topics  . Alcohol use: No  . Drug use: No     Allergies   Ivp dye [iodinated diagnostic agents]; Lipitor [atorvastatin]; and Penicillins   Review of Systems Review of Systems  Constitutional: Positive for chills. Negative for fever.  HENT: Negative for congestion, sore throat and trouble swallowing.   Respiratory: Negative for cough and shortness of breath.   Cardiovascular: Positive for chest pain.  Gastrointestinal: Positive for abdominal pain (epigastric indigestion) and nausea. Negative for blood in stool, diarrhea and vomiting.  Genitourinary: Negative for difficulty urinating and dysuria.  Musculoskeletal: Negative for back pain.  Skin: Negative for rash.  Neurological: Negative for dizziness, syncope, weakness, light-headedness, numbness and headaches.  Psychiatric/Behavioral: Negative for agitation.     Physical Exam Updated Vital Signs There were no vitals taken for this visit.  Physical Exam  Constitutional: She appears well-developed and well-nourished. No distress.  No acute distress, nontoxic-appearing.  HENT:  Head: Normocephalic and atraumatic.  Mouth/Throat: Oropharynx is clear and moist.  Eyes: Pupils are equal, round, and reactive to light. Conjunctivae are normal. Right eye exhibits no discharge.  Left eye exhibits no discharge.  Neck: Normal range of motion. Neck supple. No JVD present. No tracheal deviation present.  No JVD.  Cardiovascular: Normal rate, regular rhythm and intact distal pulses.  No murmur heard. Pulmonary/Chest: Effort normal and breath sounds normal. No stridor. No respiratory distress. She has no wheezes. She has no rales.  Mild left-sided anterior chest wall tenderness with palpation.  No bruise or rash noted on the skin.  Abdominal: Soft. Bowel sounds are normal.  Abdomen soft and nondistended.  Midline well-healed surgical scar present.  Mild tenderness to palpation in the epigastric area and left upper quadrant.  No guarding, rigidity or rebound tenderness.  Musculoskeletal:  No leg swelling or calf tenderness.  Neurological: She is alert. Coordination normal.  Skin: Skin is warm and dry. Capillary refill takes less than 2 seconds. She is not diaphoretic.  Psychiatric: She has a normal mood and affect. Her behavior is normal.  Nursing note and vitals reviewed.  ED Treatments / Results  Labs (all labs ordered are listed, but only abnormal results are displayed) Labs Reviewed  CBC WITH DIFFERENTIAL/PLATELET - Abnormal; Notable for the following components:      Result Value   WBC 11.0 (*)    Neutro Abs 7.8 (*)    Abs Immature Granulocytes 0.08 (*)    All other components within normal limits  COMPREHENSIVE METABOLIC PANEL - Abnormal; Notable for the following components:   BUN 21 (*)    All other components within normal limits  TROPONIN I - Abnormal; Notable for the following components:   Troponin I 0.03 (*)    All other components within normal limits  LIPASE, BLOOD  TROPONIN I  TROPONIN I    EKG None  Radiology Ct Abdomen Pelvis Wo Contrast  Result Date: 02/28/2018 CLINICAL DATA:  Nausea and vomiting.  Back pain. EXAM: CT ABDOMEN AND PELVIS WITHOUT CONTRAST TECHNIQUE: Multidetector CT imaging of the abdomen and pelvis was performed  following the standard protocol without IV contrast. COMPARISON:  10/10/2016 FINDINGS: Lower chest: No acute abnormality. Hepatobiliary: No focal liver abnormality is seen. Status post cholecystectomy. No biliary dilatation. Pancreas: Unremarkable. No pancreatic ductal dilatation or surrounding inflammatory changes. Spleen: Previous splenectomy. Adrenals/Urinary Tract: Normal appearance of the adrenal glands. No kidney stones or hydronephrosis identified. The urinary bladder appears within normal limits. Stomach/Bowel: The stomach appears nondistended. No abnormal small bowel dilatation identified. Previous appendectomy. No pathologic dilatation of the colon. Vascular/Lymphatic: Mild aortic atherosclerosis. No aneurysm. No abdominal no pelvic adenopathy. Reproductive: Status post hysterectomy. Cyst in right ovary measures 2.6 cm. No adnexal mass identified. Other: No free fluid within the abdomen or pelvis. Musculoskeletal: No acute or significant osseous findings. IMPRESSION: 1. No acute findings within the abdomen or pelvis. 2. Status post cholecystectomy, appendectomy and hysterectomy 3.  Aortic Atherosclerosis (ICD10-I70.0). Electronically Signed   By: Signa Kell M.D.   On: 02/28/2018 10:39    Procedures Procedures (including critical care time)  Medications Ordered in ED Medications  gi cocktail (Maalox,Lidocaine,Donnatal) (has no administration in time range)  gi cocktail (Maalox,Lidocaine,Donnatal) (30 mLs Oral Given 03/01/18 1309)  promethazine (PHENERGAN) tablet 25 mg (25 mg Oral Given 03/01/18 1340)  morphine 2 MG/ML injection 2 mg (2 mg Intramuscular Given 03/01/18 1408)     Initial Impression / Assessment and Plan / ED Course  I have reviewed the triage vital signs and the nursing notes.  Pertinent labs & imaging results that were available during my care of the patient were reviewed by me and considered in my medical decision making (see chart for details).     Presents to the  ED with indigestion, left sided chest discomfort and chills. No SOB, breathing improves her symptoms. Recently had a GI illness with nausea and vomiting. She is PERC negative and I doubt PE based on symptoms. CXR negative for acute abnormality, no pneumothorax, pneumomediastinum or pneumonia. No signs of fluid overload or vascular congestion on CXR to suggest CHF exacerbation. No widening of mediastinum, pulse deficits, neurological symptoms and I do not suspect aortic dissection. CBC reveals leukocytosis, although this is much improved from yesterday. Lipase negative, doubt pancreatitis. CMP unremarkable, normal liver enzymes and creatinine.   Patient's EKG without ischemia.  Her second troponin was 0.03 versus initial troponin <0.03. I discussed this with Dr. Clayborne Dana who suggests a third troponin and if increasing, patient will need admission. If it comes back down, she can be discharged home given her atypical symptoms.   Patient's symptoms treated  with GI cocktail, phenergan and morphine. She reports improvement. Third troponin <0.03. Plan to discharge home. I have counseled her to follow up with GI given she reports her symptoms are mainly epigastric indigestion. She can continue taking nexium, phenergan and carafate. We discussed strict return precautions and she agrees and appears reliable.   Final Clinical Impressions(s) / ED Diagnoses   Final diagnoses:  Chest pain, unspecified type  Indigestion    ED Discharge Orders    None       Lawrence Marseilles 03/01/18 2018    Mesner, Barbara Cower, MD 03/02/18 [phone]

## 2018-03-01 NOTE — ED Notes (Signed)
Troponin 0..03 . PA and RN  notified

## 2018-03-01 NOTE — ED Notes (Signed)
Pt refused GI cocktail. States is ready to go home.

## 2018-03-01 NOTE — ED Notes (Signed)
Pt on monitor 

## 2018-04-05 DIAGNOSIS — G894 Chronic pain syndrome: Secondary | ICD-10-CM | POA: Insufficient documentation

## 2018-07-23 ENCOUNTER — Emergency Department (HOSPITAL_BASED_OUTPATIENT_CLINIC_OR_DEPARTMENT_OTHER): Payer: BLUE CROSS/BLUE SHIELD

## 2018-07-23 ENCOUNTER — Emergency Department (HOSPITAL_BASED_OUTPATIENT_CLINIC_OR_DEPARTMENT_OTHER)
Admission: EM | Admit: 2018-07-23 | Discharge: 2018-07-23 | Disposition: A | Discharge status: Home / Self Care | Payer: BLUE CROSS/BLUE SHIELD | Attending: Emergency Medicine | Admitting: Emergency Medicine

## 2018-07-23 ENCOUNTER — Encounter (HOSPITAL_BASED_OUTPATIENT_CLINIC_OR_DEPARTMENT_OTHER): Payer: Self-pay | Admitting: *Deleted

## 2018-07-23 ENCOUNTER — Other Ambulatory Visit: Payer: Self-pay

## 2018-07-23 DIAGNOSIS — M25562 Pain in left knee: Secondary | ICD-10-CM | POA: Diagnosis present

## 2018-07-23 DIAGNOSIS — I10 Essential (primary) hypertension: Secondary | ICD-10-CM | POA: Insufficient documentation

## 2018-07-23 DIAGNOSIS — F17210 Nicotine dependence, cigarettes, uncomplicated: Secondary | ICD-10-CM | POA: Diagnosis not present

## 2018-07-23 DIAGNOSIS — Z79899 Other long term (current) drug therapy: Secondary | ICD-10-CM | POA: Insufficient documentation

## 2018-07-23 DIAGNOSIS — L03032 Cellulitis of left toe: Secondary | ICD-10-CM | POA: Diagnosis not present

## 2018-07-23 MED ORDER — CEPHALEXIN 250 MG PO CAPS
500.0000 mg | ORAL_CAPSULE | Freq: Once | ORAL | Status: AC
  Administered 2018-07-23: 500 mg via ORAL
  Filled 2018-07-23: qty 2

## 2018-07-23 MED ORDER — PREDNISONE 50 MG PO TABS: 50.0000 mg | ORAL_TABLET | Freq: Every day | ORAL | 0 refills | Status: DC

## 2018-07-23 MED ORDER — CEPHALEXIN 500 MG PO CAPS: 500.0000 mg | ORAL_CAPSULE | Freq: Three times a day (TID) | ORAL | 0 refills | Status: DC

## 2018-07-23 MED ORDER — PREDNISONE 50 MG PO TABS
60.0000 mg | ORAL_TABLET | Freq: Once | ORAL | Status: AC
  Administered 2018-07-23: 60 mg via ORAL
  Filled 2018-07-23: qty 1

## 2018-07-23 NOTE — ED Provider Notes (Signed)
[facility] EMERGENCY DEPARTMENT Provider Note   CSN: [id number] Arrival date & time: 07/23/18  0410    History   Chief Complaint Chief Complaint  Patient presents with  . Knee Pain    HPI Paige Woodward is a 48 y.o. female.   The history is provided by the patient.  She has history of hypertension, ulcerative colitis, myocardial infarction, GERD and comes in complaining of left knee pain which started at about 3PM.  Pain is moderately severe and she rates it at 7/10.  It is worse with standing, nothing makes it better.  She has taken a dose of diclofenac and oxycodone without relief.  She denies any trauma and denies any unusual bending or lifting or twisting or walking or climbing.  She has not had similar knee problems in the past.  Past Medical History:  Diagnosis Date  . Fibromyalgia   . GERD (gastroesophageal reflux disease)   . Hypertension   . MI (myocardial infarction) (HCC)   . Ulcerative colitis (HCC)     There are no active problems to display for this patient.   Past Surgical History:  Procedure Laterality Date  . ABDOMINAL HYSTERECTOMY    . APPENDECTOMY    . CARDIAC CATHETERIZATION    . CHOLECYSTECTOMY    . EYE SURGERY    . SPLENECTOMY       OB History   No obstetric history on file.      Home Medications    Prior to Admission medications   Medication Sig Start Date End Date Taking? Authorizing Provider  amLODipine (NORVASC) 10 MG tablet Take by mouth.    [provider]  AMLODIPINE BESYLATE PO Take by mouth.    [provider]  atenolol (TENORMIN) 100 MG tablet Take by mouth.    [provider]  ATENOLOL PO Take by mouth.    [provider]  benzonatate (TESSALON) 100 MG capsule Take 1 capsule (100 mg total) by mouth every 8 (eight) hours. 04/28/15   Garlon Hatchet, PA-C  busPIRone (BUSPAR) 30 MG tablet Take 30 mg by mouth 2 (two) times daily.    [provider]  Dexlansoprazole (DEXILANT  PO) Take by mouth.    [provider]  famotidine (PEPCID) 20 MG tablet Take 1 tablet (20 mg total) by mouth 2 (two) times daily. 02/28/18   Arby Barrette, MD  HydrOXYzine Pamoate (VISTARIL PO) Take by mouth.    [provider]  losartan (COZAAR) 50 MG tablet Take by mouth. 04/07/15   [provider]  LOSARTAN POTASSIUM PO Take by mouth.    [provider]  OMEPRAZOLE PO Take by mouth.    [provider]  oxyCODONE (ROXICODONE) 15 MG immediate release tablet Take 15 mg by mouth every 4 (four) hours as needed for pain.    [provider]  oxyCODONE (ROXICODONE) 15 MG immediate release tablet Take by mouth. 09/17/17   [provider]  pantoprazole (PROTONIX) 40 MG tablet Take by mouth. 09/06/16   [provider]  potassium chloride SA (K-DUR,KLOR-CON) 20 MEQ tablet Take 1 tablet (20 mEq total) by mouth daily. 02/28/18   Arby Barrette, MD  Pregabalin (LYRICA PO) Take by mouth.    [provider]  promethazine (PHENERGAN) 25 MG tablet Take 1 tablet (25 mg total) by mouth every 6 (six) hours as needed for nausea or vomiting. 02/26/18   Lawyer, Cristal Deer, PA-C  Rosuvastatin Calcium (CRESTOR PO) Take by mouth.    [provider]  sucralfate (CARAFATE) 1 g tablet Take 1 tablet (1 g total) by mouth 4 (four) times daily -  with meals and at bedtime. 09/23/17   Ward, Chase Picket, PA-C  sucralfate (CARAFATE) 1 GM/10ML suspension Take 10 mLs (1 g total) by mouth 4 (four) times daily -  with meals and at bedtime. 02/28/18   Arby Barrette, MD    Family History Family History  Problem Relation Age of Onset  . Stroke Father     Social History Social History   Tobacco Use  . Smoking status: Current Every Day Smoker    Types: Cigarettes  . Smokeless tobacco: Never Used  Substance Use Topics  . Alcohol use: No  . Drug use: No     Allergies   Ivp dye [iodinated diagnostic agents]; Lipitor [atorvastatin];  and Penicillins   Review of Systems Review of Systems  All other systems reviewed and are negative.    Physical Exam Updated Vital Signs BP 129/82 (BP Location: Right Arm)   Pulse 65   Temp 98.4 F (36.9 C) (Oral)   Resp 16   Ht 5\' 7"  (1.702 m)   Wt 72.6 kg   SpO2 100%   BMI 25.06 kg/m   Physical Exam Vitals signs and nursing note reviewed.    48 year old female, resting comfortably and in no acute distress. Vital signs are normal. Oxygen saturation is 100%, which is normal. Head is normocephalic and atraumatic. PERRLA, EOMI. Oropharynx is clear. Neck is nontender and supple without adenopathy or JVD. Back is nontender and there is no CVA tenderness. Lungs are clear without rales, wheezes, or rhonchi. Chest is nontender. Heart has regular rate and rhythm without murmur. Abdomen is soft, flat, nontender without masses or hepatosplenomegaly and peristalsis is normoactive. Extremities: There is diffuse tenderness around the left knee.  No effusion is present.  There is no instability on valgus or varus stress.  Lachman and McMurray's tests are normal.  There is no erythema or warmth.  Left first toe has erythema with minimal warmth but no swelling.  The toenail is somewhat deformed, and a new nail is growing in underneath it.  Remainder of extremity exam is normal. Skin is warm and dry without rash. Neurologic: Mental status is normal, cranial nerves are intact, there are no motor or sensory deficits.  ED Treatments / Results   Radiology Dg Knee Complete 4 Views Left  Result Date: 07/23/2018 CLINICAL DATA:  Left knee pain that comes and goes, starting on Sunday EXAM: LEFT KNEE - COMPLETE 4+ VIEW COMPARISON:  03/17/2009 FINDINGS: Osteopenic appearance. There is no evidence of fracture, erosion, joint effusion, or bone lesion. IMPRESSION: 1. No acute finding. 2. Osteopenic appearance compared to 2010. Electronically Signed   By: Marnee Spring M.D.   On: 07/23/2018 05:06     Procedures Procedures   Medications Ordered in ED Medications  predniSONE (DELTASONE) tablet 60 mg (has no administration in time range)  cephALEXin (KEFLEX) capsule 500 mg (has no administration in time range)     Initial Impression / Assessment and Plan / ED Course  I have reviewed the triage vital signs and the nursing notes.  Pertinen imaging results that were available during my care of the patient were reviewed by me and considered in my medical decision making (see chart for details).  Left knee pain of uncertain cause.  No evidence of ligamentous injury.  No effusion or erythema to suggest gout or pseudogout.  Old records are reviewed,  and she has no relevant past visits.  She will be sent for knee x-ray.  Left first toe appears to have a low-grade cellulitis, probably related to recent nail trauma.  She is given a dose of cephalexin.  X-rays are negative for acute injury, no evidence of joint effusion.  Cause for knee pain is unclear, but may be early gout.  She is discharged with prescriptions for prednisone and cephalexin, referred to sports medicine for follow-up.  Return precautions discussed.  She is given a knee immobilizer to use as needed.  She has crutches at home to use as needed.  Final Clinical Impressions(s) / ED Diagnoses   Final diagnoses:  Acute pain of left knee  Cellulitis of great toe, left    ED Discharge Orders         Ordered    predniSONE (DELTASONE) 50 MG tablet  Daily,   Status:  Discontinued     07/23/18 0522    predniSONE (DELTASONE) 50 MG tablet  Daily     07/23/18 0522    cephALEXin (KEFLEX) 500 MG capsule  3 times daily     07/23/18 0524           Dione Booze, MD 07/23/18 [phone]

## 2018-07-23 NOTE — ED Triage Notes (Addendum)
C/o left knee pain that started on Sunday. States pain comes and goes. Describes as throbbing. Denies injury. Able to ambulate but painful. Has not had this pain before. No obvious swelling, but states she feels knee is swollen. Pain with palpation. States she took diclofenac at 2200 with no relief.

## 2018-07-23 NOTE — Discharge Instructions (Signed)
Soak your left foot in warm water several times a day.    Apply ice to your left knee on several times a day.  Wear the knee immobilizer as needed.  Use your crutches as needed.  Continue taking diclofenac and oxycodone.  You may take acetaminophen for additional pain relief.

## 2018-07-23 NOTE — ED Notes (Signed)
PMS intact before and after. Pt tolerated well. All questions answered. 

## 2018-10-25 ENCOUNTER — Encounter (HOSPITAL_BASED_OUTPATIENT_CLINIC_OR_DEPARTMENT_OTHER): Payer: Self-pay | Admitting: Emergency Medicine

## 2018-10-25 ENCOUNTER — Other Ambulatory Visit: Payer: Self-pay

## 2018-10-25 ENCOUNTER — Emergency Department (HOSPITAL_BASED_OUTPATIENT_CLINIC_OR_DEPARTMENT_OTHER)
Admission: EM | Admit: 2018-10-25 | Discharge: 2018-10-25 | Disposition: A | Discharge status: Home / Self Care | Payer: BC Managed Care – PPO | Attending: Emergency Medicine | Admitting: Emergency Medicine

## 2018-10-25 DIAGNOSIS — R109 Unspecified abdominal pain: Secondary | ICD-10-CM | POA: Insufficient documentation

## 2018-10-25 DIAGNOSIS — Z79899 Other long term (current) drug therapy: Secondary | ICD-10-CM | POA: Insufficient documentation

## 2018-10-25 DIAGNOSIS — I252 Old myocardial infarction: Secondary | ICD-10-CM | POA: Diagnosis not present

## 2018-10-25 DIAGNOSIS — F17210 Nicotine dependence, cigarettes, uncomplicated: Secondary | ICD-10-CM | POA: Insufficient documentation

## 2018-10-25 DIAGNOSIS — R197 Diarrhea, unspecified: Secondary | ICD-10-CM | POA: Diagnosis not present

## 2018-10-25 DIAGNOSIS — E876 Hypokalemia: Secondary | ICD-10-CM | POA: Diagnosis not present

## 2018-10-25 DIAGNOSIS — R112 Nausea with vomiting, unspecified: Secondary | ICD-10-CM | POA: Insufficient documentation

## 2018-10-25 DIAGNOSIS — I10 Essential (primary) hypertension: Secondary | ICD-10-CM | POA: Diagnosis not present

## 2018-10-25 DIAGNOSIS — R1110 Vomiting, unspecified: Secondary | ICD-10-CM | POA: Diagnosis present

## 2018-10-25 LAB — URINALYSIS, MICROSCOPIC (REFLEX)

## 2018-10-25 LAB — URINALYSIS, ROUTINE W REFLEX MICROSCOPIC
Glucose, UA: NEGATIVE mg/dL
Ketones, ur: 15 mg/dL — AB
Leukocytes,Ua: NEGATIVE
Nitrite: NEGATIVE
Protein, ur: 30 mg/dL — AB
Specific Gravity, Urine: >1.03 — ABNORMAL HIGH (ref 1.005–1.030)
pH: 6 (ref 5.0–8.0)

## 2018-10-25 LAB — LIPASE, BLOOD: Lipase: 56 U/L — ABNORMAL HIGH (ref 11–51)

## 2018-10-25 LAB — CBC WITH DIFFERENTIAL/PLATELET
Abs Immature Granulocytes: 0.09 10*3/uL — ABNORMAL HIGH (ref 0.00–0.07)
Basophils Absolute: 0 10*3/uL (ref 0.0–0.1)
Basophils Relative: 0 %
Eosinophils Absolute: 0 10*3/uL (ref 0.0–0.5)
Eosinophils Relative: 0 %
HCT: 34 % — ABNORMAL LOW (ref 36.0–46.0)
Hemoglobin: 11.8 g/dL — ABNORMAL LOW (ref 12.0–15.0)
Immature Granulocytes: 1 %
Lymphocytes Relative: 8 %
Lymphs Abs: 1.4 10*3/uL (ref 0.7–4.0)
MCH: 29.4 pg (ref 26.0–34.0)
MCHC: 34.7 g/dL (ref 30.0–36.0)
MCV: 84.6 fL (ref 80.0–100.0)
Monocytes Absolute: 0.8 10*3/uL (ref 0.1–1.0)
Monocytes Relative: 5 %
Neutro Abs: 14.7 10*3/uL — ABNORMAL HIGH (ref 1.7–7.7)
Neutrophils Relative %: 86 %
Platelets: 392 10*3/uL (ref 150–400)
RBC: 4.02 MIL/uL (ref 3.87–5.11)
RDW: 12.4 % (ref 11.5–15.5)
WBC: 17 10*3/uL — ABNORMAL HIGH (ref 4.0–10.5)
nRBC: 0 % (ref 0.0–0.2)

## 2018-10-25 LAB — COMPREHENSIVE METABOLIC PANEL
ALT: 21 U/L (ref 0–44)
AST: 26 U/L (ref 15–41)
Albumin: 4.6 g/dL (ref 3.5–5.0)
Alkaline Phosphatase: 67 U/L (ref 38–126)
Anion gap: 10 (ref 5–15)
BUN: 17 mg/dL (ref 6–20)
CO2: 24 mmol/L (ref 22–32)
Calcium: 9.6 mg/dL (ref 8.9–10.3)
Chloride: 102 mmol/L (ref 98–111)
Creatinine, Ser: 0.96 mg/dL (ref 0.44–1.00)
GFR calc Af Amer: >60 mL/min (ref >60)
GFR calc non Af Amer: >60 mL/min (ref >60)
Glucose, Bld: 117 mg/dL — ABNORMAL HIGH (ref 70–99)
Potassium: 2.9 mmol/L — ABNORMAL LOW (ref 3.5–5.1)
Sodium: 136 mmol/L (ref 135–145)
Total Bilirubin: 0.9 mg/dL (ref 0.3–1.2)
Total Protein: 7.9 g/dL (ref 6.5–8.1)

## 2018-10-25 MED ORDER — ONDANSETRON HCL 4 MG/2ML IJ SOLN
4.0000 mg | Freq: Once | INTRAMUSCULAR | Status: AC
  Administered 2018-10-25: 4 mg via INTRAVENOUS
  Filled 2018-10-25: qty 2

## 2018-10-25 MED ORDER — METOCLOPRAMIDE HCL 10 MG PO TABS: 10.0000 mg | ORAL_TABLET | Freq: Four times a day (QID) | ORAL | 0 refills | Status: DC | PRN

## 2018-10-25 MED ORDER — POTASSIUM CHLORIDE 10 MEQ/100ML IV SOLN
10.0000 meq | INTRAVENOUS | Status: AC
  Administered 2018-10-25 (×2): 10 meq via INTRAVENOUS
  Filled 2018-10-25 (×2): qty 100

## 2018-10-25 MED ORDER — METOCLOPRAMIDE HCL 5 MG/ML IJ SOLN
10.0000 mg | Freq: Once | INTRAMUSCULAR | Status: AC
  Administered 2018-10-25: 06:00:00 10 mg via INTRAVENOUS
  Filled 2018-10-25: qty 2

## 2018-10-25 MED ORDER — SODIUM CHLORIDE 0.9 % IV BOLUS
1000.0000 mL | Freq: Once | INTRAVENOUS | Status: AC
  Administered 2018-10-25: 05:00:00 1000 mL via INTRAVENOUS

## 2018-10-25 MED ORDER — DIPHENHYDRAMINE HCL 50 MG/ML IJ SOLN
25.0000 mg | Freq: Once | INTRAMUSCULAR | Status: AC
  Administered 2018-10-25: 06:00:00 25 mg via INTRAVENOUS
  Filled 2018-10-25: qty 1

## 2018-10-25 NOTE — ED Notes (Signed)
ED Provider at bedside. 

## 2018-10-25 NOTE — ED Notes (Signed)
Pt provided water and a ginger ale for po challenge at her request.

## 2018-10-25 NOTE — ED Notes (Signed)
Patient is resting comfortably. 

## 2018-10-25 NOTE — ED Triage Notes (Signed)
Pt states that yesterday morning she started vomiting. 5-6 emesis a day. Also had small bout of diarrhea that resolved. States she has lower abdominal pain. Denies urinary symptoms or vaginal discharge or bleeding. Denies fever

## 2018-10-25 NOTE — ED Provider Notes (Signed)
  Provider Note MRN:  [id number]  Arrival date & time: 10/25/18    ED Course and Medical Decision Making  Assumed care from Dr. Roxanne Mins at shift change.  Completed fluids and potassium, continues to feel well, appropriate for dc.  After the discussed management above, the patient was determined to be safe for discharge.  The patient was in agreement with this plan and all questions regarding their care were answered.  ED return precautions were discussed and the patient will return to the ED with any significant worsening of condition.    Final Clinical Impressions(s) / ED Diagnoses     ICD-10-CM   1. Nausea vomiting and diarrhea  R11.2    R19.7   2. Hypokalemia  E87.6     ED Discharge Orders         Ordered    metoCLOPramide (REGLAN) 10 MG tablet  Every 6 hours PRN     10/25/18 [place] M. Sedonia Small, [place] [email or web address]    Maudie Flakes, MD 10/25/18 [phone]

## 2018-10-25 NOTE — Discharge Instructions (Addendum)
You were evaluated in the Emergency Department and after careful evaluation, we did not find any emergent condition requiring admission or further testing in the hospital.  Your symptoms today seem to be due to a viral infection of the GI tract.  Please use the nausea medication at home to help keep down fluids.  Please return to the Emergency Department if you experience any worsening of your condition.  We encourage you to follow up with a primary care provider.  Thank you for allowing [place] to be a part of your care.

## 2018-10-25 NOTE — ED Provider Notes (Addendum)
[facility] EMERGENCY DEPARTMENT Provider Note   CSN: [id number] Arrival date & time: 10/25/18  0437     History   Chief Complaint Chief Complaint  Patient presents with  . Emesis  . Abdominal Pain    HPI Paige Woodward is a 48 y.o. female.   The history is provided by the patient.  Emesis Associated symptoms: abdominal pain   Abdominal Pain Associated symptoms: vomiting   She has a history of hypertension, ulcerative colitis, myocardial infarction, GERD and comes in complaining of nausea and vomiting since yesterday morning.  She is vomited numerous times.  She initially had a small amount of diarrhea, but is no longer having any diarrhea or sense of rectal urgency.  There is some lower abdominal cramping which is mild.  She denies fever, chills, sweats.  She denies any sick contacts.  There has been no treatment at home.  Past Medical History:  Diagnosis Date  . Fibromyalgia   . GERD (gastroesophageal reflux disease)   . Hypertension   . MI (myocardial infarction) (HCC)   . Ulcerative colitis (HCC)     There are no active problems to display for this patient.   Past Surgical History:  Procedure Laterality Date  . ABDOMINAL HYSTERECTOMY    . APPENDECTOMY    . CARDIAC CATHETERIZATION    . CHOLECYSTECTOMY    . EYE SURGERY    . SPLENECTOMY       OB History   No obstetric history on file.      Home Medications    Prior to Admission medications   Medication Sig Start Date End Date Taking? Authorizing Provider  amLODipine (NORVASC) 10 MG tablet Take by mouth.    [provider]  AMLODIPINE BESYLATE PO Take by mouth.    [provider]  atenolol (TENORMIN) 100 MG tablet Take by mouth.    [provider]  ATENOLOL PO Take by mouth.    [provider]  benzonatate (TESSALON) 100 MG capsule Take 1 capsule (100 mg total) by mouth every 8 (eight) hours. 04/28/15   Garlon HatchetSanders, Lisa M, PA-C  busPIRone (BUSPAR) 30 MG tablet  Take 30 mg by mouth 2 (two) times daily.    [provider]  cephALEXin (KEFLEX) 500 MG capsule Take 1 capsule (500 mg total) by mouth 3 (three) times daily. 07/23/18   Dione BoozeGlick, Shyane Fossum, MD  Dexlansoprazole (DEXILANT PO) Take by mouth.    [provider]  famotidine (PEPCID) 20 MG tablet Take 1 tablet (20 mg total) by mouth 2 (two) times daily. 02/28/18   Arby BarrettePfeiffer, Marcy, MD  HydrOXYzine Pamoate (VISTARIL PO) Take by mouth.    [provider]  losartan (COZAAR) 50 MG tablet Take by mouth. 04/07/15   [provider]  LOSARTAN POTASSIUM PO Take by mouth.    [provider]  OMEPRAZOLE PO Take by mouth.    [provider]  oxyCODONE (ROXICODONE) 15 MG immediate release tablet Take 15 mg by mouth every 4 (four) hours as needed for pain.    [provider]  oxyCODONE (ROXICODONE) 15 MG immediate release tablet Take by mouth. 09/17/17   [provider]  pantoprazole (PROTONIX) 40 MG tablet Take by mouth. 09/06/16   [provider]  potassium chloride SA (K-DUR,KLOR-CON) 20 MEQ tablet Take 1 tablet (20 mEq total) by mouth daily. 02/28/18   Arby BarrettePfeiffer, Marcy, MD  predniSONE (DELTASONE) 50 MG tablet Take 1 tablet (50 mg total) by mouth daily. 07/23/18   Preston FleetingGlick,  Onalee Huaavid, MD  Pregabalin (LYRICA PO) Take by mouth.    [provider]  promethazine (PHENERGAN) 25 MG tablet Take 1 tablet (25 mg total) by mouth every 6 (six) hours as needed for nausea or vomiting. 02/26/18   Lawyer, Cristal Deerhristopher, PA-C  Rosuvastatin Calcium (CRESTOR PO) Take by mouth.    [provider]  sucralfate (CARAFATE) 1 g tablet Take 1 tablet (1 g total) by mouth 4 (four) times daily -  with meals and at bedtime. 09/23/17   Ward, Chase PicketJaime Pilcher, PA-C  sucralfate (CARAFATE) 1 GM/10ML suspension Take 10 mLs (1 g total) by mouth 4 (four) times daily -  with meals and at bedtime. 02/28/18   Arby BarrettePfeiffer, Marcy, MD    Family History Family History  Problem Relation  Age of Onset  . Stroke Father     Social History Social History   Tobacco Use  . Smoking status: Current Every Day Smoker    Types: Cigarettes  . Smokeless tobacco: Never Used  Substance Use Topics  . Alcohol use: No  . Drug use: No     Allergies   Ivp dye [iodinated diagnostic agents], Lipitor [atorvastatin], and Penicillins   Review of Systems Review of Systems  Gastrointestinal: Positive for abdominal pain and vomiting.  All other systems reviewed and are negative.    Physical Exam Updated Vital Signs BP (!) 154/84   Pulse 60   Temp 98.4 F (36.9 C) (Oral)   Resp 16   Ht 5\' 7"  (1.702 m)   Wt 72.6 kg   SpO2 100%   BMI 25.06 kg/m   Physical Exam Vitals signs and nursing note reviewed.   48 year old female, resting comfortably and in no acute distress. Vital signs are significant for elevated blood pressure. Oxygen saturation is 100%, which is normal. Head is normocephalic and atraumatic. PERRLA, EOMI. Oropharynx is clear. Neck is nontender and supple without adenopathy or JVD. Back is nontender and there is no CVA tenderness. Lungs are clear without rales, wheezes, or rhonchi. Chest is nontender. Heart has regular rate and rhythm without murmur. Abdomen is soft, flat, with mild epigastric tenderness and moderate suprapubic tenderness.  There is no rebound or guarding.  There are no masses or hepatosplenomegaly and peristalsis is hypoactive. Extremities have no cyanosis or edema, full range of motion is present. Skin is warm and dry without rash. Neurologic: Mental status is normal, cranial nerves are intact, there are no motor or sensory deficits.   ED Treatments / Results  Labs (all labs ordered are listed, but only abnormal results are displayed) Labs Reviewed  COMPREHENSIVE METABOLIC PANEL - Abnormal; Notable for the following components:      Result Value   Potassium 2.9 (*)    Glucose, Bld 117 (*)    All other components within normal limits   LIPASE, BLOOD - Abnormal; Notable for the following components:   Lipase 56 (*)    All other components within normal limits  CBC WITH DIFFERENTIAL/PLATELET - Abnormal; Notable for the following components:   WBC 17.0 (*)    Hemoglobin 11.8 (*)    HCT 34.0 (*)    Neutro Abs 14.7 (*)    Abs Immature Granulocytes 0.09 (*)    All other components within normal limits  URINALYSIS, ROUTINE W REFLEX MICROSCOPIC - Abnormal; Notable for the following components:   APPearance CLOUDY (*)    Specific Gravity, Urine >1.030 (*)    Hgb urine dipstick LARGE (*)    Bilirubin Urine SMALL (*)  Ketones, ur 15 (*)    Protein, ur 30 (*)    All other components within normal limits  URINALYSIS, MICROSCOPIC (REFLEX) - Abnormal; Notable for the following components:   Bacteria, UA MANY (*)    All other components within normal limits    Procedures Procedures  Medications Ordered in ED Medications  potassium chloride 10 mEq in 100 mL IVPB (10 mEq Intravenous New Bag/Given 10/25/18 0534)  metoCLOPramide (REGLAN) injection 10 mg (has no administration in time range)  diphenhydrAMINE (BENADRYL) injection 25 mg (has no administration in time range)  ondansetron (ZOFRAN) injection 4 mg (4 mg Intravenous Given 10/25/18 0509)  sodium chloride 0.9 % bolus 1,000 mL (1,000 mLs Intravenous New Bag/Given 10/25/18 0509)     Initial Impression / Assessment and Plan / ED Course  I have reviewed the triage vital signs and the nursing notes.  Pertinent labs & imaging results that were available during my care of the patient were reviewed by me and considered in my medical decision making (see chart for details).  Nausea, vomiting, diarrhea and pattern strongly suggestive of viral gastroenteritis.  No red flags to suggest more serious pathology.  Old records are reviewed, and she does have a similar presentation in here last fall at which time CT scan was negative.  At this point, no indications for advanced imaging.   Will check screening labs and give IV fluids and IV ondansetron.  Labs are significant for hypokalemia.  Urine appears to be a contaminated specimen, she does not clinically have a UTI so will not be started on antibiotics.  She had modest improvement from nausea with IV ondansetron.  She is given a dose of metoclopramide.  She is also given intravenous potassium.    She feels much better after above-noted treatment.  She will be given a prescription for metoclopramide.  Case is signed out to Dr. Sedonia Small  Final Clinical Impressions(s) / ED Diagnoses   Final diagnoses:  Nausea vomiting and diarrhea  Hypokalemia    ED Discharge Orders         Ordered    metoCLOPramide (REGLAN) 10 MG tablet  Every 6 hours PRN     10/25/18 1751           Delora Fuel, MD 02/58/52 [id number]    Delora Fuel, MD 42/35/36 [phone]

## 2018-12-25 DIAGNOSIS — M5412 Radiculopathy, cervical region: Secondary | ICD-10-CM | POA: Insufficient documentation

## 2019-08-11 IMAGING — CR DG CHEST 2V
2 series · 2 of 2 positions shown · non-contrast
Comparison: 10/10/2016.

CLINICAL DATA: Chest pain for 4 days.  Ulcerative colitis.

EXAM:
CHEST - 2 VIEW

[w chest pa]
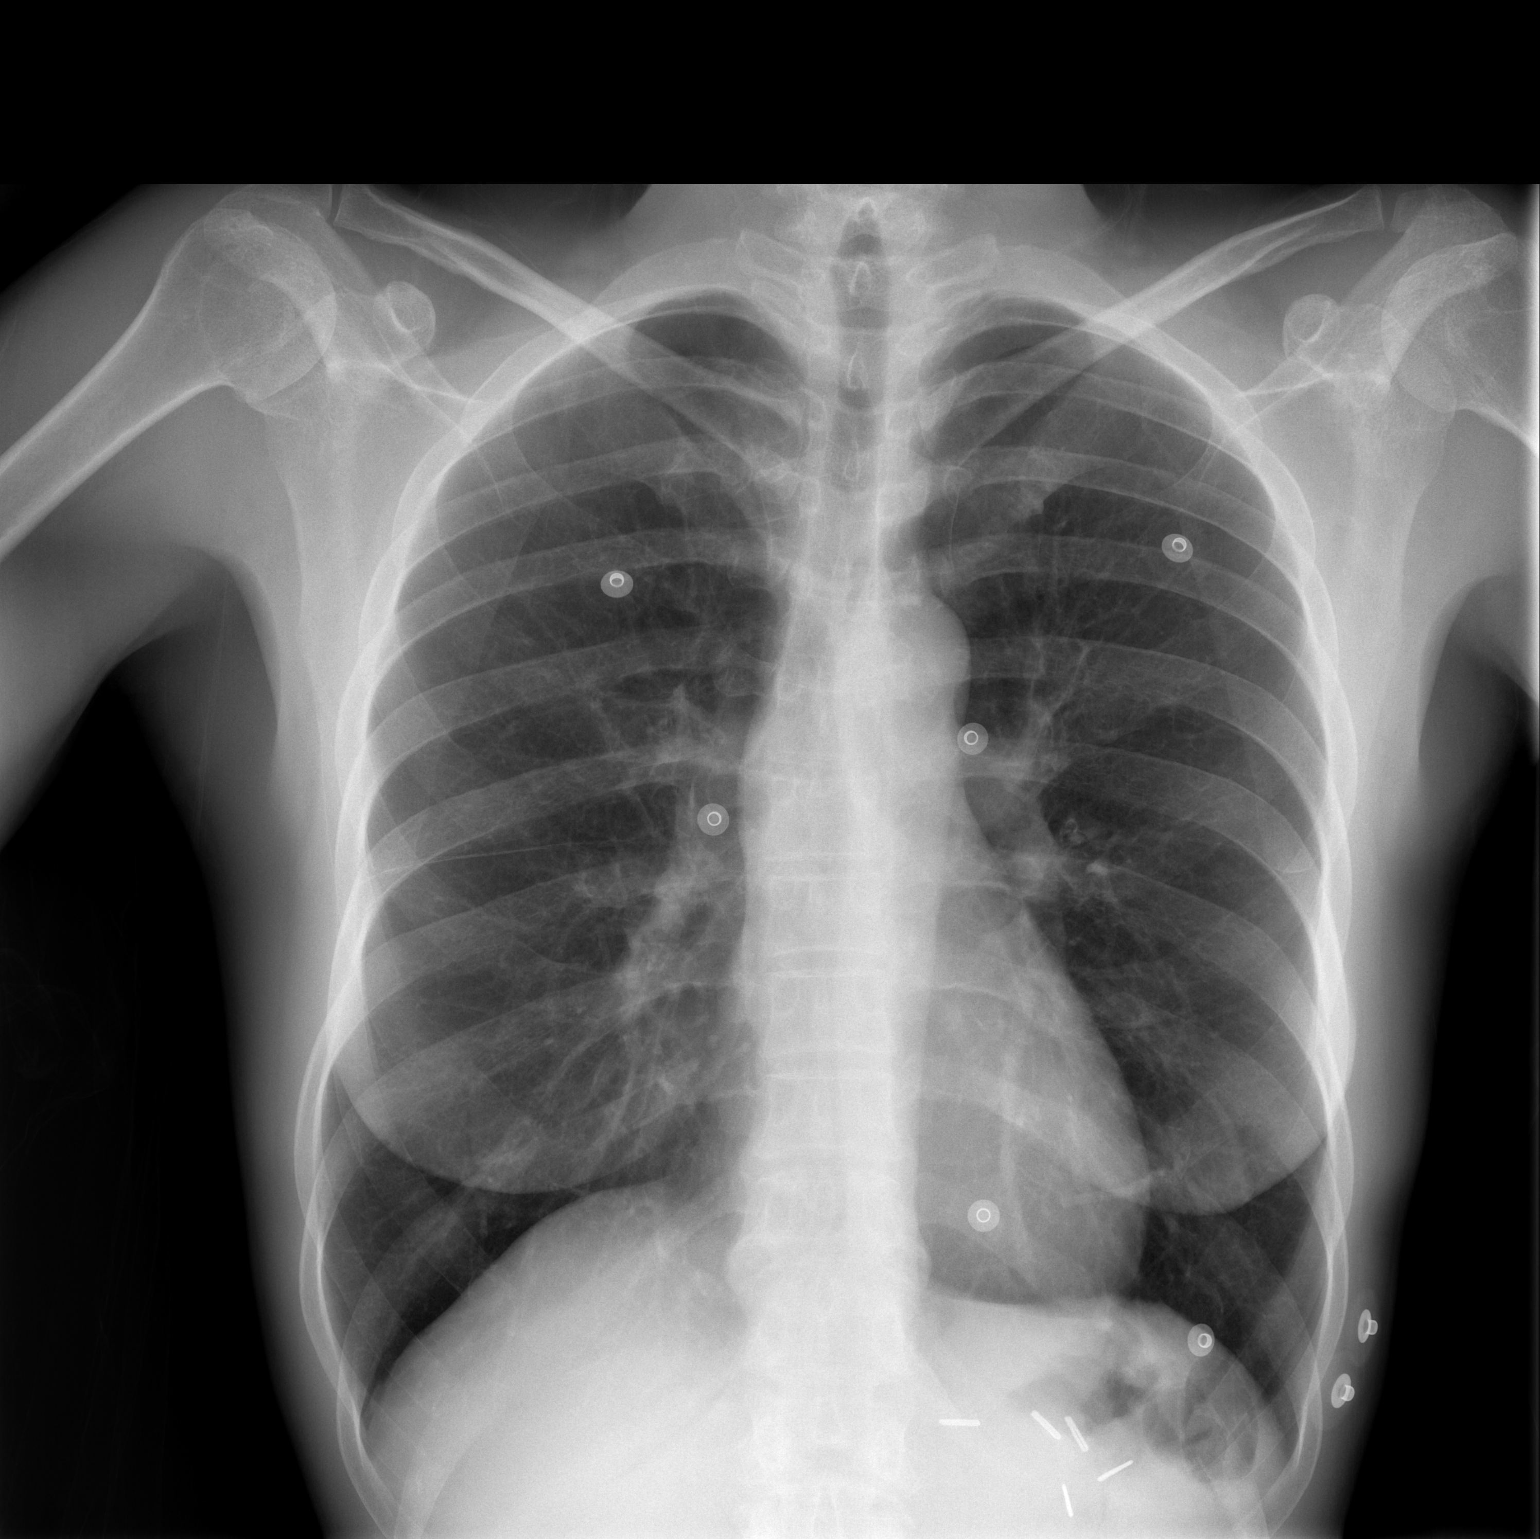

[w chest lat]
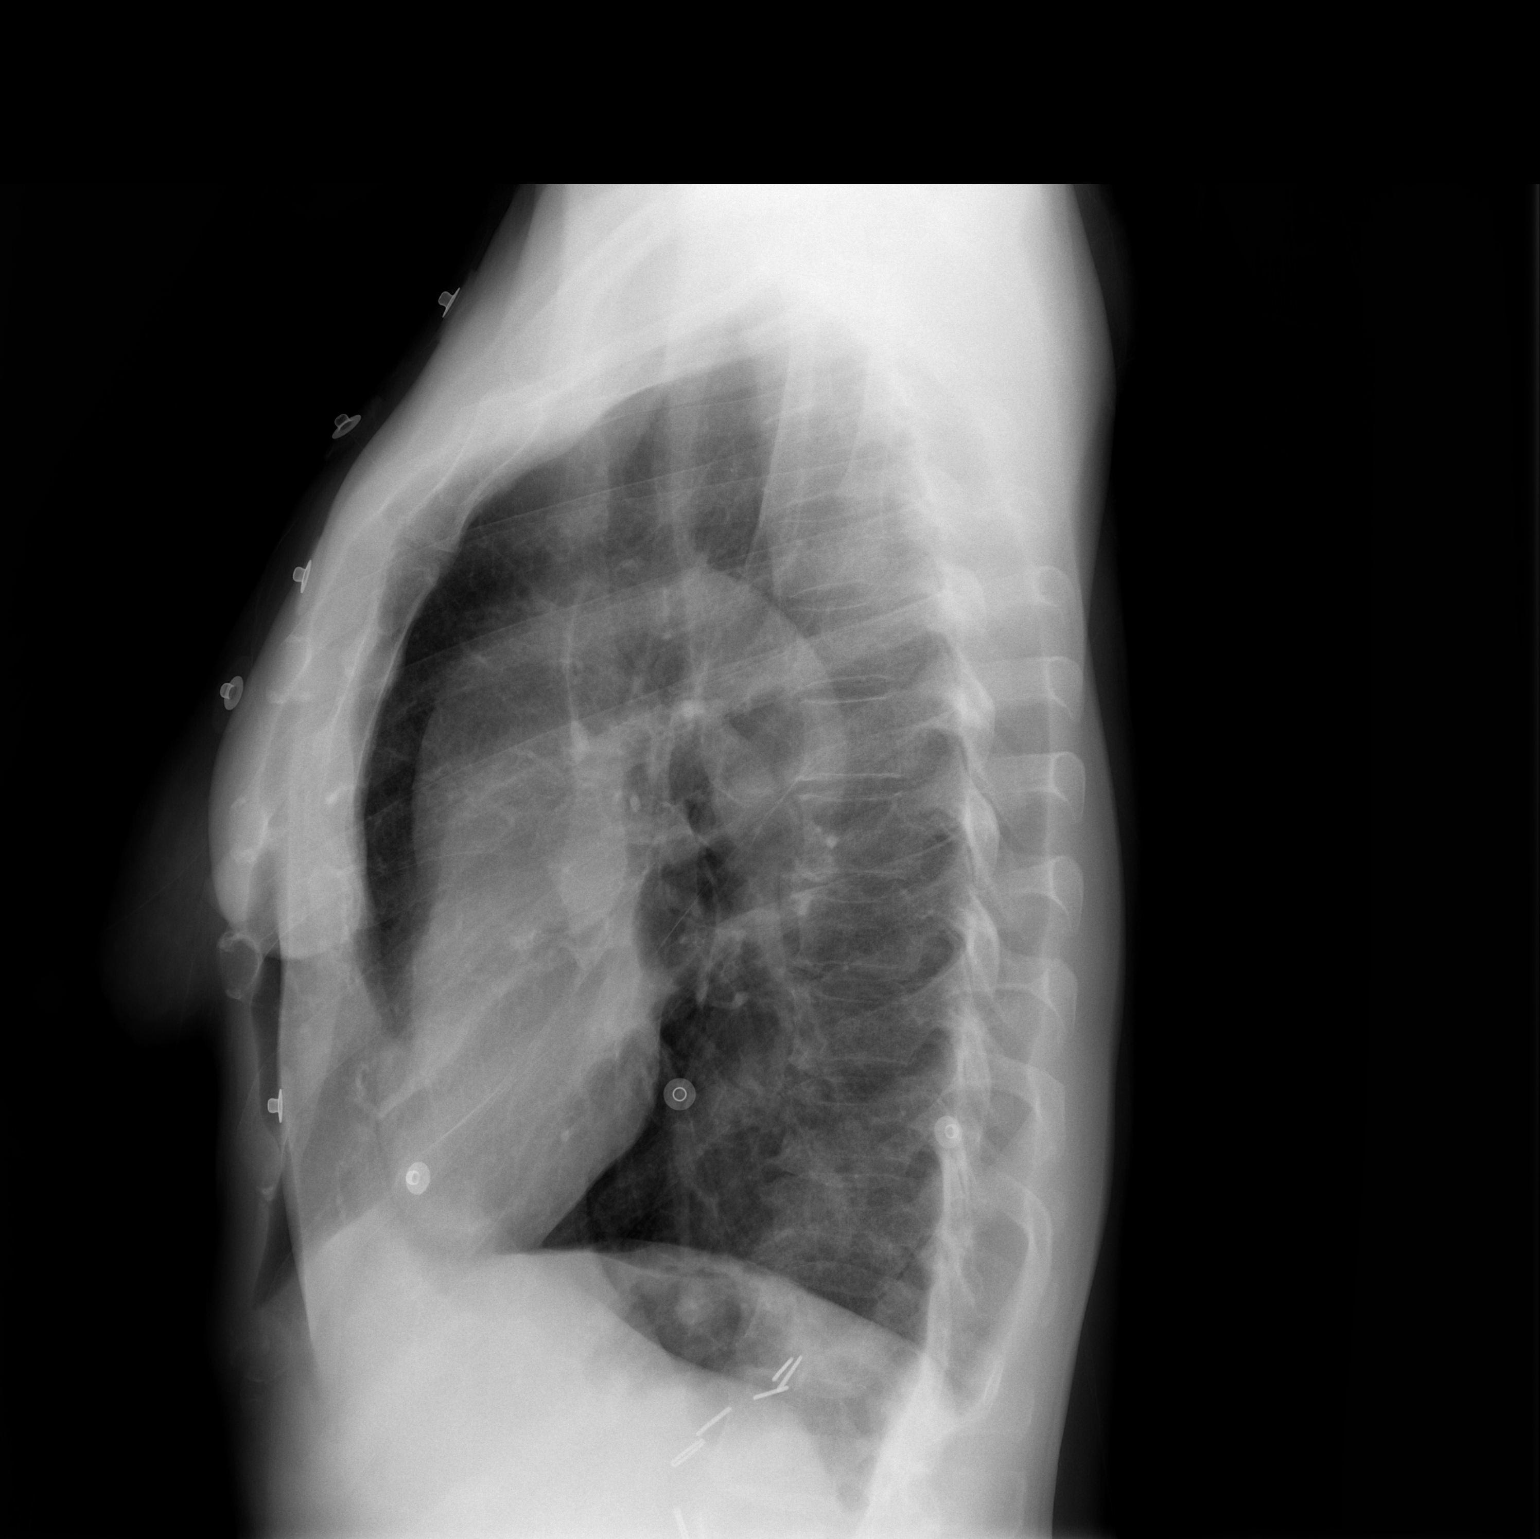

[2 of 2 positions shown; findings below may reference images not displayed]

FINDINGS: Normal heart size. Clear lung fields. No bony abnormality. Surgical
clips LEFT upper quadrant. Compared with priors, considerable weight
loss.
IMPRESSION: No active disease.

## 2019-09-03 DIAGNOSIS — Z8673 Personal history of transient ischemic attack (TIA), and cerebral infarction without residual deficits: Secondary | ICD-10-CM | POA: Insufficient documentation

## 2019-12-07 ENCOUNTER — Emergency Department (HOSPITAL_BASED_OUTPATIENT_CLINIC_OR_DEPARTMENT_OTHER): Payer: BC Managed Care – PPO

## 2019-12-07 ENCOUNTER — Emergency Department (HOSPITAL_BASED_OUTPATIENT_CLINIC_OR_DEPARTMENT_OTHER)
Admission: EM | Admit: 2019-12-07 | Discharge: 2019-12-07 | Disposition: A | Discharge status: Home / Self Care | Payer: BC Managed Care – PPO | Attending: Emergency Medicine | Admitting: Emergency Medicine

## 2019-12-07 ENCOUNTER — Other Ambulatory Visit: Payer: Self-pay

## 2019-12-07 ENCOUNTER — Encounter (HOSPITAL_BASED_OUTPATIENT_CLINIC_OR_DEPARTMENT_OTHER): Payer: Self-pay | Admitting: Emergency Medicine

## 2019-12-07 DIAGNOSIS — M47816 Spondylosis without myelopathy or radiculopathy, lumbar region: Secondary | ICD-10-CM | POA: Insufficient documentation

## 2019-12-07 DIAGNOSIS — M5416 Radiculopathy, lumbar region: Secondary | ICD-10-CM | POA: Diagnosis not present

## 2019-12-07 DIAGNOSIS — I10 Essential (primary) hypertension: Secondary | ICD-10-CM | POA: Diagnosis not present

## 2019-12-07 DIAGNOSIS — R2689 Other abnormalities of gait and mobility: Secondary | ICD-10-CM | POA: Diagnosis not present

## 2019-12-07 DIAGNOSIS — F17210 Nicotine dependence, cigarettes, uncomplicated: Secondary | ICD-10-CM | POA: Diagnosis not present

## 2019-12-07 DIAGNOSIS — M79662 Pain in left lower leg: Secondary | ICD-10-CM | POA: Diagnosis present

## 2019-12-07 MED ORDER — ORPHENADRINE CITRATE ER 100 MG PO TB12: 100.0000 mg | ORAL_TABLET | Freq: Two times a day (BID) | ORAL | 0 refills | Status: DC

## 2019-12-07 MED ORDER — LIDOCAINE 5 % EX PTCH
1.0000 | MEDICATED_PATCH | CUTANEOUS | Status: DC
  Administered 2019-12-07: 1 via TRANSDERMAL
  Filled 2019-12-07: qty 1

## 2019-12-07 MED ORDER — DIAZEPAM 5 MG PO TABS
5.0000 mg | ORAL_TABLET | Freq: Once | ORAL | Status: AC
  Administered 2019-12-07: 5 mg via ORAL
  Filled 2019-12-07: qty 1

## 2019-12-07 NOTE — Discharge Instructions (Addendum)
Stop your Flexeril, take Norflex as prescribed.  Follow-up with your doctor.

## 2019-12-07 NOTE — ED Triage Notes (Signed)
Patient states that she is having unbearable left leg pain - from her hip down her left leg. The patient states that it hurts worse when she stands up. She states that it has been going on for about a week

## 2019-12-07 NOTE — ED Provider Notes (Signed)
[facility] EMERGENCY DEPARTMENT Provider Note   CSN: [id number] Arrival date & time: 12/07/19  1942     History Chief Complaint  Patient presents with  . Leg Pain    Paige Woodward is a 49 y.o. female.  49 year old female presents with complaint of pain from her left lower back radiating down her left leg posteriorly to just below her knee.  Patient states pain has been ongoing for the past week, worse she tries to walk, unable to stand straight without pain.  Patient states that she did slip and fall landing on her buttocks a few months ago, had neck pain following the fall but no back pain until now.  Denies abdominal pain, changes in bowel or bladder habits, groin numbness.  Patient has a pain patch on, also taking Flexeril, has tried applying Voltaren gel all without relief.  No other complaints or concerns.        Past Medical History:  Diagnosis Date  . Fibromyalgia   . GERD (gastroesophageal reflux disease)   . Hypertension   . MI (myocardial infarction) (HCC)   . Ulcerative colitis (HCC)     There are no problems to display for this patient.   Past Surgical History:  Procedure Laterality Date  . ABDOMINAL HYSTERECTOMY    . APPENDECTOMY    . CARDIAC CATHETERIZATION    . CHOLECYSTECTOMY    . EYE SURGERY    . SPLENECTOMY       OB History   No obstetric history on file.     Family History  Problem Relation Age of Onset  . Stroke Father     Social History   Tobacco Use  . Smoking status: Current Every Day Smoker    Types: Cigarettes  . Smokeless tobacco: Never Used  Vaping Use  . Vaping Use: Never used  Substance Use Topics  . Alcohol use: No  . Drug use: No    Home Medications Prior to Admission medications   Medication Sig Start Date End Date Taking? Authorizing Provider  amLODipine (NORVASC) 10 MG tablet Take by mouth.    [provider]  AMLODIPINE BESYLATE PO Take by mouth.    [provider]  atenolol  (TENORMIN) 100 MG tablet Take by mouth.    [provider]  ATENOLOL PO Take by mouth.    [provider]  benzonatate (TESSALON) 100 MG capsule Take 1 capsule (100 mg total) by mouth every 8 (eight) hours. 04/28/15   Garlon Hatchet, PA-C  busPIRone (BUSPAR) 30 MG tablet Take 30 mg by mouth 2 (two) times daily.    [provider]  cephALEXin (KEFLEX) 500 MG capsule Take 1 capsule (500 mg total) by mouth 3 (three) times daily. 07/23/18   Dione Booze, MD  Dexlansoprazole (DEXILANT PO) Take by mouth.    [provider]  famotidine (PEPCID) 20 MG tablet Take 1 tablet (20 mg total) by mouth 2 (two) times daily. 02/28/18   Arby Barrette, MD  HydrOXYzine Pamoate (VISTARIL PO) Take by mouth.    [provider]  losartan (COZAAR) 50 MG tablet Take by mouth. 04/07/15   [provider]  LOSARTAN POTASSIUM PO Take by mouth.    [provider]  metoCLOPramide (REGLAN) 10 MG tablet Take 1 tablet (10 mg total) by mouth every 6 (six) hours as needed for nausea (or headache). 10/25/18   Dione Booze, MD  OMEPRAZOLE PO Take by mouth.    [provider]  orphenadrine (NORFLEX)  100 MG tablet Take 1 tablet (100 mg total) by mouth 2 (two) times daily. 12/07/19   Jeannie Fend, PA-C  oxyCODONE (ROXICODONE) 15 MG immediate release tablet Take 15 mg by mouth every 4 (four) hours as needed for pain.    [provider]  oxyCODONE (ROXICODONE) 15 MG immediate release tablet Take by mouth. 09/17/17   [provider]  pantoprazole (PROTONIX) 40 MG tablet Take by mouth. 09/06/16   [provider]  potassium chloride SA (K-DUR,KLOR-CON) 20 MEQ tablet Take 1 tablet (20 mEq total) by mouth daily. 02/28/18   Arby Barrette, MD  predniSONE (DELTASONE) 50 MG tablet Take 1 tablet (50 mg total) by mouth daily. 07/23/18   Dione Booze, MD  Pregabalin (LYRICA PO) Take by mouth.    [provider]  promethazine (PHENERGAN) 25 MG  tablet Take 1 tablet (25 mg total) by mouth every 6 (six) hours as needed for nausea or vomiting. 02/26/18   Lawyer, Cristal Deer, PA-C  Rosuvastatin Calcium (CRESTOR PO) Take by mouth.    [provider]  sucralfate (CARAFATE) 1 g tablet Take 1 tablet (1 g total) by mouth 4 (four) times daily -  with meals and at bedtime. 09/23/17   Ward, Chase Picket, PA-C  sucralfate (CARAFATE) 1 GM/10ML suspension Take 10 mLs (1 g total) by mouth 4 (four) times daily -  with meals and at bedtime. 02/28/18   Arby Barrette, MD    Allergies    Ivp dye [iodinated diagnostic agents], Lipitor [atorvastatin], and Penicillins  Review of Systems   Review of Systems  Constitutional: Negative for fever.  Gastrointestinal: Negative for abdominal pain, constipation, diarrhea, nausea and vomiting.  Genitourinary: Negative for difficulty urinating and dysuria.  Musculoskeletal: Positive for arthralgias, back pain and gait problem. Negative for joint swelling, neck pain and neck stiffness.  Skin: Negative for rash and wound.  Allergic/Immunologic: Negative for immunocompromised state.  Neurological: Negative for weakness and numbness.  All other systems reviewed and are negative.   Physical Exam Updated Vital Signs BP (!) 134/72 (BP Location: Right Arm)   Pulse 64   Temp 98.5 F (36.9 C) (Oral)   Resp 18   Ht 5\' 7"  (1.702 m)   Wt 87.1 kg   SpO2 100%   BMI 30.07 kg/m   Physical Exam Vitals and nursing note reviewed.  Constitutional:      General: She is not in acute distress.    Appearance: She is well-developed. She is not diaphoretic.  HENT:     Head: Normocephalic and atraumatic.  Cardiovascular:     Pulses: Normal pulses.  Pulmonary:     Effort: Pulmonary effort is normal.  Abdominal:     Tenderness: There is no abdominal tenderness.  Musculoskeletal:        General: Tenderness present. No swelling or deformity.     Thoracic back: No tenderness.     Lumbar back: Tenderness present.  No bony tenderness.       Back:     Right lower leg: No edema.     Left lower leg: No edema.     Comments: TTP left lower back, no bony tenderness.   Skin:    General: Skin is warm and dry.     Capillary Refill: Capillary refill takes less than 2 seconds.     Findings: No erythema or rash.  Neurological:     Mental Status: She is alert and oriented to person, place, and time.     Sensory: Sensation  is intact. No sensory deficit.     Motor: No weakness.     Deep Tendon Reflexes: Reflexes normal. Babinski sign absent on the right side. Babinski sign absent on the left side.     Reflex Scores:      Patellar reflexes are 1+ on the right side and 1+ on the left side.      Achilles reflexes are 1+ on the right side and 1+ on the left side. Psychiatric:        Behavior: Behavior normal.     ED Results / Procedures / Treatments   Labs (all labs ordered are listed, but only abnormal results are displayed) Labs Reviewed - No data to display  EKG None  Radiology DG Lumbar Spine Complete  Result Date: 12/07/2019 CLINICAL DATA:  Larey Seat several months ago, low back pain, left leg radiculopathy EXAM: LUMBAR SPINE - COMPLETE 4+ VIEW COMPARISON:  02/28/2018 FINDINGS: Frontal, bilateral oblique, lateral views of the lumbar spine are obtained. There are 5 non-rib-bearing lumbar type vertebral bodies in grossly normal anatomic alignment. No acute displaced fracture. There is mild spondylosis at L1/L2 and L2/L3. There is diffuse facet hypertrophy greatest at L4/L5 and L5/S1. Sacroiliac joints are normal. Soft tissues are unremarkable. IMPRESSION: 1. Mild upper lumbar spondylosis, with diffuse facet hypertrophic changes. 2. No acute bony abnormality. Electronically Signed   By: Sharlet Salina M.D.   On: 12/07/2019 21:03    Procedures Procedures (including critical care time)  Medications Ordered in ED Medications  lidocaine (LIDODERM) 5 % 1 patch (1 patch Transdermal Patch Applied 12/07/19 2036)    diazepam (VALIUM) tablet 5 mg (5 mg Oral Given 12/07/19 2036)    ED Course  I have reviewed the triage vital signs and the nursing notes.  Pertinent labs & imaging results that were available during my care of the patient were reviewed by me and considered in my medical decision making (see chart for details).  Clinical Course as of Dec 07 2143  Sat Dec 07, 2019  [id number] 49 year old female with complaint of left lower back pain radiating down the left leg posteriorly.  Patient states that she fell a few months ago, did not have back pain until this past week.  On exam has tenderness left lower back, sensation, strength, reflexes are symmetric with DP pulses present.  X-ray shows lumbar spondylosis and facet arthropathy.  Patient was given Valium in the ER with a Lidoderm patch.  Recommend that she discontinue her Flexeril and given prescription for Norflex.  Recommend follow-up with PCP.   [LM]    Clinical Course User Index [LM] Alden Hipp   MDM Rules/Calculators/A&P                          Final Clinical Impression(s) / ED Diagnoses Final diagnoses:  Lumbar spondylosis  Lumbar radicular pain    Rx / DC Orders ED Discharge Orders         Ordered    orphenadrine (NORFLEX) 100 MG tablet  2 times daily     Discontinue  Reprint     12/07/19 2143           Jeannie Fend, PA-C 12/07/19 2145    Linwood Dibbles, MD 12/09/19 0002

## 2019-12-07 NOTE — ED Notes (Signed)
Pt discharged to home. Discharge instructions have been discussed with patient and/or family members. Pt verbally acknowledges understanding d/c instructions, and endorses comprehension to checkout at registration before leaving.  °

## 2019-12-14 DIAGNOSIS — R2990 Unspecified symptoms and signs involving the nervous system: Secondary | ICD-10-CM | POA: Insufficient documentation

## 2019-12-16 DIAGNOSIS — M5442 Lumbago with sciatica, left side: Secondary | ICD-10-CM | POA: Insufficient documentation

## 2020-05-07 DIAGNOSIS — I749 Embolism and thrombosis of unspecified artery: Secondary | ICD-10-CM | POA: Insufficient documentation

## 2020-09-21 ENCOUNTER — Encounter (HOSPITAL_BASED_OUTPATIENT_CLINIC_OR_DEPARTMENT_OTHER): Payer: Self-pay | Admitting: *Deleted

## 2020-09-21 ENCOUNTER — Emergency Department (HOSPITAL_BASED_OUTPATIENT_CLINIC_OR_DEPARTMENT_OTHER)
Admission: EM | Admit: 2020-09-21 | Discharge: 2020-09-21 | Disposition: A | Discharge status: Home / Self Care | Payer: BC Managed Care – PPO | Attending: Emergency Medicine | Admitting: Emergency Medicine

## 2020-09-21 ENCOUNTER — Other Ambulatory Visit: Payer: Self-pay

## 2020-09-21 ENCOUNTER — Emergency Department (HOSPITAL_BASED_OUTPATIENT_CLINIC_OR_DEPARTMENT_OTHER): Payer: BC Managed Care – PPO

## 2020-09-21 DIAGNOSIS — I10 Essential (primary) hypertension: Secondary | ICD-10-CM | POA: Insufficient documentation

## 2020-09-21 DIAGNOSIS — M79671 Pain in right foot: Secondary | ICD-10-CM | POA: Insufficient documentation

## 2020-09-21 DIAGNOSIS — Z79899 Other long term (current) drug therapy: Secondary | ICD-10-CM | POA: Diagnosis not present

## 2020-09-21 DIAGNOSIS — F17210 Nicotine dependence, cigarettes, uncomplicated: Secondary | ICD-10-CM | POA: Diagnosis not present

## 2020-09-21 MED ORDER — ACETAMINOPHEN 500 MG PO TABS
1000.0000 mg | ORAL_TABLET | Freq: Once | ORAL | Status: AC
  Administered 2020-09-21: 1000 mg via ORAL
  Filled 2020-09-21: qty 2

## 2020-09-21 MED ORDER — KETOROLAC TROMETHAMINE 60 MG/2ML IM SOLN
15.0000 mg | Freq: Once | INTRAMUSCULAR | Status: DC
  Filled 2020-09-21: qty 2

## 2020-09-21 NOTE — ED Triage Notes (Signed)
Presents with rt foot pain, onset this past Friday, denies any injury to foot, noted to have some swelling. Bluish type color noted at rt foot 5th toe, walking makes it worse, lying down does not provide any relief.

## 2020-09-21 NOTE — ED Notes (Signed)
Comfort measures provided, rt foot elevated on bed. Call bell within reach

## 2020-09-21 NOTE — ED Provider Notes (Signed)
[facility] EMERGENCY DEPARTMENT Provider Note   CSN: [id number] Arrival date & time: 09/21/20  0831     History Chief Complaint  Patient presents with  . Foot Pain    Paige NormanSonya Woodward is a 50 y.o. female.  50 yo F with a chief complaints of right foot pain.  Going on for a few days.  Feels like her right foot is painful and swollen.  Worse to the lateral aspect of the foot.  Denies obvious injury.  No history of gout.  She does have a bunion on the medial aspect of her great toe.  Has had that for years.  That hurts a little bit but worse on the outside.  No fevers.  No recent vascular procedure.  The history is provided by the patient.  Foot Pain This is a new problem. The current episode started yesterday. The problem occurs constantly. The problem has not changed since onset.Pertinent negatives include no chest pain, no headaches and no shortness of breath. The symptoms are aggravated by bending, twisting and walking. Nothing relieves the symptoms. She has tried nothing for the symptoms. The treatment provided no relief.       Past Medical History:  Diagnosis Date  . Fibromyalgia   . GERD (gastroesophageal reflux disease)   . Hypertension   . MI (myocardial infarction) (HCC)   . Ulcerative colitis (HCC)     There are no problems to display for this patient.   Past Surgical History:  Procedure Laterality Date  . ABDOMINAL HYSTERECTOMY    . APPENDECTOMY    . CARDIAC CATHETERIZATION    . CHOLECYSTECTOMY    . EYE SURGERY    . SPLENECTOMY       OB History   No obstetric history on file.     Family History  Problem Relation Age of Onset  . Stroke Father     Social History   Tobacco Use  . Smoking status: Current Every Day Smoker    Types: Cigarettes  . Smokeless tobacco: Never Used  Vaping Use  . Vaping Use: Never used  Substance Use Topics  . Alcohol use: No  . Drug use: No    Home Medications Prior to Admission medications   Medication Sig  Start Date End Date Taking? Authorizing Provider  amLODipine (NORVASC) 10 MG tablet Take by mouth.   Yes [provider]  atenolol (TENORMIN) 100 MG tablet Take by mouth.   Yes [provider]  famotidine (PEPCID) 20 MG tablet Take 1 tablet (20 mg total) by mouth 2 (two) times daily. 02/28/18  Yes Arby BarrettePfeiffer, Marcy, MD  HydrOXYzine Pamoate (VISTARIL PO) Take by mouth.   Yes [provider]  losartan (COZAAR) 50 MG tablet Take by mouth. 04/07/15  Yes [provider]  oxyCODONE (ROXICODONE) 15 MG immediate release tablet Take 15 mg by mouth every 4 (four) hours as needed for pain.   Yes [provider]  pantoprazole (PROTONIX) 40 MG tablet Take by mouth. 09/06/16  Yes [provider]  promethazine (PHENERGAN) 25 MG tablet Take 1 tablet (25 mg total) by mouth every 6 (six) hours as needed for nausea or vomiting. 02/26/18  Yes Lawyer, Cristal Deerhristopher, PA-C  sucralfate (CARAFATE) 1 g tablet Take 1 tablet (1 g total) by mouth 4 (four) times daily -  with meals and at bedtime. 09/23/17  Yes Ward, Chase PicketJaime Pilcher, PA-C  sucralfate (CARAFATE) 1 GM/10ML suspension Take 10 mLs (1 g total) by mouth 4 (four) times daily -  with meals and at bedtime. 02/28/18  Yes Arby Barrette, MD  AMLODIPINE BESYLATE PO Take by mouth.    [provider]  ATENOLOL PO Take by mouth.    [provider]  benzonatate (TESSALON) 100 MG capsule Take 1 capsule (100 mg total) by mouth every 8 (eight) hours. 04/28/15   Garlon Hatchet, PA-C  busPIRone (BUSPAR) 30 MG tablet Take 30 mg by mouth 2 (two) times daily.    [provider]  cephALEXin (KEFLEX) 500 MG capsule Take 1 capsule (500 mg total) by mouth 3 (three) times daily. 07/23/18   Dione Booze, MD  Dexlansoprazole (DEXILANT PO) Take by mouth.    [provider]  LOSARTAN POTASSIUM PO Take by mouth.    [provider]  metoCLOPramide (REGLAN) 10 MG tablet Take 1 tablet (10 mg total) by mouth  every 6 (six) hours as needed for nausea (or headache). 10/25/18   Dione Booze, MD  OMEPRAZOLE PO Take by mouth.    [provider]  orphenadrine (NORFLEX) 100 MG tablet Take 1 tablet (100 mg total) by mouth 2 (two) times daily. 12/07/19   Jeannie Fend, PA-C  oxyCODONE (ROXICODONE) 15 MG immediate release tablet Take by mouth. 09/17/17   [provider]  potassium chloride SA (K-DUR,KLOR-CON) 20 MEQ tablet Take 1 tablet (20 mEq total) by mouth daily. 02/28/18   Arby Barrette, MD  predniSONE (DELTASONE) 50 MG tablet Take 1 tablet (50 mg total) by mouth daily. 07/23/18   Dione Booze, MD  Pregabalin (LYRICA PO) Take by mouth.    [provider]  Rosuvastatin Calcium (CRESTOR PO) Take by mouth.    [provider]    Allergies    Ivp dye [iodinated diagnostic agents], Lipitor [atorvastatin], and Penicillins  Review of Systems   Review of Systems  Constitutional: Negative for chills and fever.  HENT: Negative for congestion and rhinorrhea.   Eyes: Negative for redness and visual disturbance.  Respiratory: Negative for shortness of breath and wheezing.   Cardiovascular: Negative for chest pain and palpitations.  Gastrointestinal: Negative for nausea and vomiting.  Genitourinary: Negative for dysuria and urgency.  Musculoskeletal: Negative for arthralgias and myalgias.  Skin: Positive for color change. Negative for pallor and wound.  Neurological: Negative for dizziness and headaches.    Physical Exam Updated Vital Signs BP 134/78 (BP Location: Right Arm)   Pulse 76   Temp 97.7 F (36.5 C) (Oral)   Resp 16   Ht 5\' 7"  (1.702 m)   Wt 90.3 kg   SpO2 100%   BMI 31.17 kg/m   Physical Exam Vitals and nursing note reviewed.  Constitutional:      General: She is not in acute distress.    Appearance: She is well-developed. She is not diaphoretic.  HENT:     Head: Normocephalic and atraumatic.  Eyes:     Pupils: Pupils are equal, round, and reactive  to light.  Cardiovascular:     Rate and Rhythm: Normal rate and regular rhythm.     Heart sounds: No murmur heard. No friction rub. No gallop.   Pulmonary:     Effort: Pulmonary effort is normal.     Breath sounds: No wheezing or rales.  Abdominal:     General: There is no distension.     Palpations: Abdomen is soft.     Tenderness: There is no abdominal tenderness.  Musculoskeletal:        General: No tenderness.     Cervical  back: Normal range of motion and neck supple.       Feet:  Skin:    General: Skin is warm and dry.  Neurological:     Mental Status: She is alert and oriented to person, place, and time.  Psychiatric:        Behavior: Behavior normal.     ED Results / Procedures / Treatments   Labs (all labs ordered are listed, but only abnormal results are displayed) Labs Reviewed - No data to display  EKG None  Radiology DG Foot Complete Right  Result Date: 09/21/2020 CLINICAL DATA:  Pain and swelling EXAM: RIGHT FOOT COMPLETE - 3+ VIEW COMPARISON:  None. FINDINGS: Frontal, oblique, and lateral views were obtained. No fracture or dislocation. There is mild hallux valgus deformity at the first MTP joint with mild bony overgrowth of the distal first metatarsal with bunion formation. There is narrowing of the first MTP joint. Other joint spaces appear normal. No erosions. IMPRESSION: Narrowing first MTP joint with mild hallux valgus deformity at the first MTP joint. There is a degree of bunion formation medially in this area. Other joint spaces appear normal. No fracture or dislocation. Electronically Signed   By: Bretta Bang III M.D.   On: 09/21/2020 09:42    Procedures Procedures   Medications Ordered in ED Medications  ketorolac (TORADOL) injection 15 mg (15 mg Intramuscular Not Given 09/21/20 0917)  acetaminophen (TYLENOL) tablet 1,000 mg (1,000 mg Oral Given 09/21/20 6160)    ED Course  I have reviewed the triage vital signs and the nursing  notes.  Pertinent labs & imaging results that were available during my care of the patient were reviewed by me and considered in my medical decision making (see chart for details).    MDM Rules/Calculators/A&P                          50 yo F with a chief complaints of right lateral foot pain.  Likely bruised based on exam.  She is on Plavix and so likely easily bruised.  I think much less likely is an acute thrombus of the toe.  Has intact sensation to light touch.  Cap refills less than 2 seconds.  Palpable pulses.  Will obtain plain film. Plain film without fracture is viewed by me.  Placed in a postop shoe.  Discussed results with the patient.  She is frustrated because I did give her a specific reason for her symptoms.  I discussed following up with her family physician.  12:04 PM:  I have discussed the diagnosis/risks/treatment options with the patient and believe the pt to be eligible for discharge home to follow-up with PCP. We also discussed returning to the ED immediately if new or worsening sx occur. We discussed the sx which are most concerning (e.g., sudden worsening pain, fever, inability to tolerate by mouth) that necessitate immediate return. Medications administered to the patient during their visit and any new prescriptions provided to the patient are listed below.  Medications given during this visit Medications  ketorolac (TORADOL) injection 15 mg (15 mg Intramuscular Not Given 09/21/20 0917)  acetaminophen (TYLENOL) tablet 1,000 mg (1,000 mg Oral Given 09/21/20 7371)     The patient appears reasonably screen and/or stabilized for discharge and I doubt any other medical condition or other [facility] requiring further screening, evaluation, or treatment in the ED at this time prior to discharge.   Final Clinical Impression(s) / ED Diagnoses Final diagnoses:  Foot pain, right    Rx / DC Orders ED Discharge Orders    None       Melene Plan, DO 09/21/20 1208

## 2020-09-21 NOTE — Discharge Instructions (Signed)
Take 4 over the counter ibuprofen tablets 3 times a day or 2 over-the-counter naproxen tablets twice a day for pain. Also take tylenol 1000mg(2 extra strength) four times a day.    

## 2020-09-21 NOTE — ED Notes (Signed)
Pt discharged to home.  Discharge instructions have been discussed with pt and/or family members.  Pt verbally acknowledges understanding of discharge instructions and endorses comprehension to check out at registration prior to leaving.Pt discharged to home.  Discharge instructions have been discussed with pt and/or family members.  Pt verbally acknowledges understanding of discharge instructions and endorses comprehension to check out at registration prior to leaving.

## 2020-09-21 NOTE — ED Notes (Signed)
Patient transported to X-ray 

## 2020-10-23 ENCOUNTER — Other Ambulatory Visit: Payer: Self-pay

## 2020-10-23 DIAGNOSIS — I83893 Varicose veins of bilateral lower extremities with other complications: Secondary | ICD-10-CM

## 2020-11-12 ENCOUNTER — Ambulatory Visit (HOSPITAL_COMMUNITY): Admission: RE | Admit: 2020-11-12 | Payer: BC Managed Care – PPO | Source: Ambulatory Visit

## 2021-01-06 ENCOUNTER — Encounter (HOSPITAL_COMMUNITY): Payer: BC Managed Care – PPO

## 2021-06-21 ENCOUNTER — Inpatient Hospital Stay (HOSPITAL_COMMUNITY): Payer: BC Managed Care – PPO

## 2021-06-21 ENCOUNTER — Emergency Department (HOSPITAL_BASED_OUTPATIENT_CLINIC_OR_DEPARTMENT_OTHER): Payer: BC Managed Care – PPO

## 2021-06-21 ENCOUNTER — Other Ambulatory Visit: Payer: Self-pay

## 2021-06-21 ENCOUNTER — Encounter (HOSPITAL_BASED_OUTPATIENT_CLINIC_OR_DEPARTMENT_OTHER): Payer: Self-pay | Admitting: Emergency Medicine

## 2021-06-21 ENCOUNTER — Inpatient Hospital Stay (HOSPITAL_BASED_OUTPATIENT_CLINIC_OR_DEPARTMENT_OTHER)
Admission: EM | Admit: 2021-06-21 | Discharge: 2021-06-22 | DRG: 281 | Disposition: A | Discharge status: Home / Self Care | Payer: BC Managed Care – PPO | Attending: Cardiovascular Disease | Admitting: Cardiovascular Disease

## 2021-06-21 DIAGNOSIS — Z7902 Long term (current) use of antithrombotics/antiplatelets: Secondary | ICD-10-CM | POA: Diagnosis not present

## 2021-06-21 DIAGNOSIS — I252 Old myocardial infarction: Secondary | ICD-10-CM

## 2021-06-21 DIAGNOSIS — R778 Other specified abnormalities of plasma proteins: Secondary | ICD-10-CM | POA: Diagnosis not present

## 2021-06-21 DIAGNOSIS — I214 Non-ST elevation (NSTEMI) myocardial infarction: Secondary | ICD-10-CM | POA: Diagnosis not present

## 2021-06-21 DIAGNOSIS — K219 Gastro-esophageal reflux disease without esophagitis: Secondary | ICD-10-CM | POA: Diagnosis present

## 2021-06-21 DIAGNOSIS — M797 Fibromyalgia: Secondary | ICD-10-CM | POA: Diagnosis present

## 2021-06-21 DIAGNOSIS — Z888 Allergy status to other drugs, medicaments and biological substances status: Secondary | ICD-10-CM | POA: Diagnosis not present

## 2021-06-21 DIAGNOSIS — Z880 Allergy status to penicillin: Secondary | ICD-10-CM

## 2021-06-21 DIAGNOSIS — I2510 Atherosclerotic heart disease of native coronary artery without angina pectoris: Secondary | ICD-10-CM | POA: Diagnosis present

## 2021-06-21 DIAGNOSIS — Z90710 Acquired absence of both cervix and uterus: Secondary | ICD-10-CM

## 2021-06-21 DIAGNOSIS — Z8673 Personal history of transient ischemic attack (TIA), and cerebral infarction without residual deficits: Secondary | ICD-10-CM

## 2021-06-21 DIAGNOSIS — I10 Essential (primary) hypertension: Secondary | ICD-10-CM | POA: Diagnosis present

## 2021-06-21 DIAGNOSIS — Z7952 Long term (current) use of systemic steroids: Secondary | ICD-10-CM

## 2021-06-21 DIAGNOSIS — G894 Chronic pain syndrome: Secondary | ICD-10-CM | POA: Diagnosis present

## 2021-06-21 DIAGNOSIS — F17210 Nicotine dependence, cigarettes, uncomplicated: Secondary | ICD-10-CM | POA: Diagnosis present

## 2021-06-21 DIAGNOSIS — Z823 Family history of stroke: Secondary | ICD-10-CM

## 2021-06-21 DIAGNOSIS — Z79899 Other long term (current) drug therapy: Secondary | ICD-10-CM | POA: Diagnosis not present

## 2021-06-21 DIAGNOSIS — Z9081 Acquired absence of spleen: Secondary | ICD-10-CM | POA: Diagnosis not present

## 2021-06-21 DIAGNOSIS — N179 Acute kidney failure, unspecified: Secondary | ICD-10-CM | POA: Diagnosis present

## 2021-06-21 DIAGNOSIS — Z91041 Radiographic dye allergy status: Secondary | ICD-10-CM

## 2021-06-21 DIAGNOSIS — E785 Hyperlipidemia, unspecified: Secondary | ICD-10-CM | POA: Diagnosis present

## 2021-06-21 DIAGNOSIS — Z20822 Contact with and (suspected) exposure to covid-19: Secondary | ICD-10-CM | POA: Diagnosis present

## 2021-06-21 LAB — ECHOCARDIOGRAM COMPLETE
AR max vel: 2.83 cm2
AV Area VTI: 2.65 cm2
AV Area mean vel: 2.74 cm2
AV Mean grad: 5 mmHg
AV Peak grad: 9.2 mmHg
Ao pk vel: 1.52 m/s
Area-P 1/2: 3.16 cm2
Height: 67 in
S' Lateral: 3.3 cm
Weight: 3245.17 oz

## 2021-06-21 LAB — CBC WITH DIFFERENTIAL/PLATELET
Abs Immature Granulocytes: 0.12 10*3/uL — ABNORMAL HIGH (ref 0.00–0.07)
Basophils Absolute: 0.1 10*3/uL (ref 0.0–0.1)
Basophils Relative: 0 %
Eosinophils Absolute: 0.1 10*3/uL (ref 0.0–0.5)
Eosinophils Relative: 1 %
HCT: 34.5 % — ABNORMAL LOW (ref 36.0–46.0)
Hemoglobin: 12.6 g/dL (ref 12.0–15.0)
Immature Granulocytes: 1 %
Lymphocytes Relative: 12 %
Lymphs Abs: 1.8 10*3/uL (ref 0.7–4.0)
MCH: 32.5 pg (ref 26.0–34.0)
MCHC: 36.5 g/dL — ABNORMAL HIGH (ref 30.0–36.0)
MCV: 88.9 fL (ref 80.0–100.0)
Monocytes Absolute: 0.8 10*3/uL (ref 0.1–1.0)
Monocytes Relative: 5 %
Neutro Abs: 12.7 10*3/uL — ABNORMAL HIGH (ref 1.7–7.7)
Neutrophils Relative %: 81 %
Platelets: 406 10*3/uL — ABNORMAL HIGH (ref 150–400)
RBC: 3.88 MIL/uL (ref 3.87–5.11)
RDW: 14 % (ref 11.5–15.5)
WBC: 15.5 10*3/uL — ABNORMAL HIGH (ref 4.0–10.5)
nRBC: 0.1 % (ref 0.0–0.2)

## 2021-06-21 LAB — RESP PANEL BY RT-PCR (FLU A&B, COVID) ARPGX2
Influenza A by PCR: NEGATIVE
Influenza B by PCR: NEGATIVE
SARS Coronavirus 2 by RT PCR: NEGATIVE

## 2021-06-21 LAB — BASIC METABOLIC PANEL
Anion gap: 9 (ref 5–15)
BUN: 20 mg/dL (ref 6–20)
CO2: 26 mmol/L (ref 22–32)
Calcium: 10 mg/dL (ref 8.9–10.3)
Chloride: 99 mmol/L (ref 98–111)
Creatinine, Ser: 1.97 mg/dL — ABNORMAL HIGH (ref 0.44–1.00)
GFR, Estimated: 30 mL/min — ABNORMAL LOW (ref >60)
Glucose, Bld: 113 mg/dL — ABNORMAL HIGH (ref 70–99)
Potassium: 3.9 mmol/L (ref 3.5–5.1)
Sodium: 134 mmol/L — ABNORMAL LOW (ref 135–145)

## 2021-06-21 LAB — HEPARIN LEVEL (UNFRACTIONATED)
Heparin Unfractionated: 0.23 IU/mL — ABNORMAL LOW (ref 0.30–0.70)
Heparin Unfractionated: 0.27 IU/mL — ABNORMAL LOW (ref 0.30–0.70)

## 2021-06-21 LAB — TROPONIN I (HIGH SENSITIVITY)
Troponin I (High Sensitivity): 72 ng/L — ABNORMAL HIGH (ref <18)
Troponin I (High Sensitivity): 189 ng/L (ref <18)
Troponin I (High Sensitivity): 2002 ng/L (ref <18)
Troponin I (High Sensitivity): 2407 ng/L (ref <18)

## 2021-06-21 LAB — D-DIMER, QUANTITATIVE: D-Dimer, Quant: 0.55 ug/mL-FEU — ABNORMAL HIGH (ref 0.00–0.50)

## 2021-06-21 LAB — MRSA NEXT GEN BY PCR, NASAL: MRSA by PCR Next Gen: NOT DETECTED

## 2021-06-21 LAB — BRAIN NATRIURETIC PEPTIDE: B Natriuretic Peptide: 61.9 pg/mL (ref 0.0–100.0)

## 2021-06-21 LAB — HEMOGLOBIN A1C
Hgb A1c MFr Bld: 4.8 % (ref 4.8–5.6)
Mean Plasma Glucose: 91.06 mg/dL

## 2021-06-21 LAB — HIV ANTIBODY (ROUTINE TESTING W REFLEX): HIV Screen 4th Generation wRfx: NONREACTIVE

## 2021-06-21 LAB — TSH: TSH: 1.331 u[IU]/mL (ref 0.350–4.500)

## 2021-06-21 MED ORDER — SODIUM CHLORIDE 0.9% FLUSH
3.0000 mL | Freq: Two times a day (BID) | INTRAVENOUS | Status: DC
  Administered 2021-06-21: 3 mL via INTRAVENOUS

## 2021-06-21 MED ORDER — SODIUM CHLORIDE 0.9 % IV BOLUS
250.0000 mL | Freq: Once | INTRAVENOUS | Status: AC
  Administered 2021-06-21: 250 mL via INTRAVENOUS

## 2021-06-21 MED ORDER — SODIUM CHLORIDE 0.9 % IV SOLN: INTRAVENOUS | Status: DC

## 2021-06-21 MED ORDER — DIPHENHYDRAMINE HCL 50 MG/ML IJ SOLN: 50.0000 mg | Freq: Once | INTRAMUSCULAR | Status: AC

## 2021-06-21 MED ORDER — PANTOPRAZOLE SODIUM 40 MG PO TBEC
40.0000 mg | DELAYED_RELEASE_TABLET | Freq: Every day | ORAL | Status: DC
  Administered 2021-06-21 – 2021-06-22 (×2): 40 mg via ORAL
  Filled 2021-06-21 (×2): qty 1

## 2021-06-21 MED ORDER — NITROGLYCERIN 0.4 MG SL SUBL: 0.4000 mg | SUBLINGUAL_TABLET | SUBLINGUAL | Status: DC | PRN

## 2021-06-21 MED ORDER — CLOPIDOGREL BISULFATE 75 MG PO TABS
75.0000 mg | ORAL_TABLET | Freq: Every day | ORAL | Status: DC
  Administered 2021-06-21: 75 mg via ORAL
  Filled 2021-06-21: qty 1

## 2021-06-21 MED ORDER — HEPARIN BOLUS VIA INFUSION
4000.0000 [IU] | Freq: Once | INTRAVENOUS | Status: AC
  Administered 2021-06-21: 4000 [IU] via INTRAVENOUS

## 2021-06-21 MED ORDER — DIPHENHYDRAMINE HCL 25 MG PO CAPS: 50.0000 mg | ORAL_CAPSULE | Freq: Once | ORAL | Status: DC

## 2021-06-21 MED ORDER — NICOTINE 14 MG/24HR TD PT24
14.0000 mg | MEDICATED_PATCH | Freq: Every day | TRANSDERMAL | Status: DC
  Administered 2021-06-21 – 2021-06-22 (×2): 14 mg via TRANSDERMAL
  Filled 2021-06-21 (×2): qty 1

## 2021-06-21 MED ORDER — HEPARIN BOLUS VIA INFUSION
1000.0000 [IU] | Freq: Once | INTRAVENOUS | Status: AC
  Administered 2021-06-21: 1000 [IU] via INTRAVENOUS
  Filled 2021-06-21: qty 1000

## 2021-06-21 MED ORDER — ASPIRIN 81 MG PO CHEW
324.0000 mg | CHEWABLE_TABLET | Freq: Once | ORAL | Status: AC
  Administered 2021-06-21: 324 mg via ORAL
  Filled 2021-06-21: qty 4

## 2021-06-21 MED ORDER — NORTRIPTYLINE HCL 25 MG PO CAPS
25.0000 mg | ORAL_CAPSULE | Freq: Every day | ORAL | Status: DC
Start: 2021-06-21 — End: 2021-06-22
  Administered 2021-06-21: 25 mg via ORAL
  Filled 2021-06-21 (×2): qty 1

## 2021-06-21 MED ORDER — SODIUM CHLORIDE 0.9% FLUSH: 3.0000 mL | INTRAVENOUS | Status: DC | PRN

## 2021-06-21 MED ORDER — DIPHENHYDRAMINE HCL 25 MG PO CAPS
50.0000 mg | ORAL_CAPSULE | Freq: Once | ORAL | Status: AC
  Administered 2021-06-22: 50 mg via ORAL
  Filled 2021-06-21: qty 2

## 2021-06-21 MED ORDER — DIPHENHYDRAMINE HCL 50 MG/ML IJ SOLN: 50.0000 mg | Freq: Once | INTRAMUSCULAR | Status: DC

## 2021-06-21 MED ORDER — ROSUVASTATIN CALCIUM 5 MG PO TABS
5.0000 mg | ORAL_TABLET | Freq: Every day | ORAL | Status: DC
  Administered 2021-06-21 – 2021-06-22 (×2): 5 mg via ORAL
  Filled 2021-06-21 (×2): qty 1

## 2021-06-21 MED ORDER — SODIUM CHLORIDE 0.9 % IV SOLN: 250.0000 mL | INTRAVENOUS | Status: DC | PRN

## 2021-06-21 MED ORDER — HYDROXYZINE HCL 25 MG PO TABS
25.0000 mg | ORAL_TABLET | Freq: Two times a day (BID) | ORAL | Status: DC
  Administered 2021-06-21 – 2021-06-22 (×3): 25 mg via ORAL
  Filled 2021-06-21 (×3): qty 1

## 2021-06-21 MED ORDER — HYDROXYZINE PAMOATE 25 MG PO CAPS
25.0000 mg | ORAL_CAPSULE | Freq: Two times a day (BID) | ORAL | Status: DC
Start: 2021-06-21 — End: 2021-06-21
  Filled 2021-06-21: qty 1

## 2021-06-21 MED ORDER — NITROGLYCERIN 0.4 MG SL SUBL
0.4000 mg | SUBLINGUAL_TABLET | SUBLINGUAL | Status: DC | PRN
  Administered 2021-06-21 (×2): 0.4 mg via SUBLINGUAL
  Filled 2021-06-21: qty 1

## 2021-06-21 MED ORDER — ASPIRIN EC 81 MG PO TBEC
81.0000 mg | DELAYED_RELEASE_TABLET | Freq: Every day | ORAL | Status: DC
  Administered 2021-06-21 – 2021-06-22 (×2): 81 mg via ORAL
  Filled 2021-06-21 (×2): qty 1

## 2021-06-21 MED ORDER — ASPIRIN 81 MG PO CHEW
81.0000 mg | CHEWABLE_TABLET | ORAL | Status: AC
  Administered 2021-06-22: 81 mg via ORAL
  Filled 2021-06-21: qty 1

## 2021-06-21 MED ORDER — HEPARIN (PORCINE) 25000 UT/250ML-% IV SOLN
1400.0000 [IU]/h | INTRAVENOUS | Status: DC
  Administered 2021-06-21: 1200 [IU]/h via INTRAVENOUS
  Administered 2021-06-21: 1000 [IU]/h via INTRAVENOUS
  Filled 2021-06-21 (×2): qty 250

## 2021-06-21 MED ORDER — ONDANSETRON HCL 4 MG/2ML IJ SOLN: 4.0000 mg | Freq: Four times a day (QID) | INTRAMUSCULAR | Status: DC | PRN

## 2021-06-21 MED ORDER — OXYCODONE HCL 5 MG PO TABS
15.0000 mg | ORAL_TABLET | Freq: Three times a day (TID) | ORAL | Status: DC | PRN
  Administered 2021-06-21 – 2021-06-22 (×3): 15 mg via ORAL
  Filled 2021-06-21 (×3): qty 3

## 2021-06-21 MED ORDER — ACETAMINOPHEN 325 MG PO TABS
650.0000 mg | ORAL_TABLET | ORAL | Status: DC | PRN
  Filled 2021-06-21: qty 2

## 2021-06-21 MED ORDER — PREDNISONE 50 MG PO TABS
50.0000 mg | ORAL_TABLET | Freq: Four times a day (QID) | ORAL | Status: AC
  Administered 2021-06-21 – 2021-06-22 (×3): 50 mg via ORAL
  Filled 2021-06-21 (×3): qty 1

## 2021-06-21 NOTE — Progress Notes (Signed)
Echocardiogram 2D Echocardiogram has been performed.  Paige Woodward 06/21/2021, 4:17 PM

## 2021-06-21 NOTE — ED Provider Notes (Signed)
[facility] DEPT [facility] Provider Note: Georgena Spurling, MD, FACEP  CSN: [id number] MRN: [id number] ARRIVAL: 06/21/21 at [place]: [id number]/[id number]   CHIEF COMPLAINT  Chest Pain   HISTORY OF PRESENT ILLNESS  06/21/21 1:27 AM Paige Woodward is a 51 y.o. female with a history of coronary artery disease.  She is here with left-sided chest pain intermittently since about 10 PM yesterday evening.  She was cooking when the pain started.  The pain was worse with deep breathing but is now constant with some radiation to the left arm.  She had some transient diaphoresis but none now.  She denies shortness of breath or nausea.  She rates the pain as a 7 out of 10.  She also feels generalized weakness.    Past Medical History:  Diagnosis Date   Fibromyalgia    GERD (gastroesophageal reflux disease)    Hypertension    MI (myocardial infarction) ([place])    Ulcerative colitis ([place])     Past Surgical History:  Procedure Laterality Date   ABDOMINAL HYSTERECTOMY     APPENDECTOMY     CARDIAC CATHETERIZATION     CHOLECYSTECTOMY     EYE SURGERY     SPLENECTOMY      Family History  Problem Relation Age of Onset   Stroke Father     Social History   Tobacco Use   Smoking status: Every Day    Types: Cigarettes   Smokeless tobacco: Never  Vaping Use   Vaping Use: Never used  Substance Use Topics   Alcohol use: No   Drug use: No    Prior to Admission medications   Medication Sig Start Date End Date Taking? Authorizing Provider  amLODipine (NORVASC) 10 MG tablet Take by mouth.    [provider]  AMLODIPINE BESYLATE PO Take by mouth.    [provider]  atenolol (TENORMIN) 100 MG tablet Take by mouth.    [provider]  ATENOLOL PO Take by mouth.    [provider]  benzonatate (TESSALON) 100 MG capsule Take 1 capsule (100 mg total) by mouth every 8 (eight) hours. 04/28/15   Larene Pickett, PA-C  busPIRone (BUSPAR) 30 MG tablet Take 30 mg by mouth 2 (two)  times daily.    [provider]  cephALEXin (KEFLEX) 500 MG capsule Take 1 capsule (500 mg total) by mouth 3 (three) times daily. [id number]   Delora Fuel, MD  Dexlansoprazole (DEXILANT PO) Take by mouth.    [provider]  famotidine (PEPCID) 20 MG tablet Take 1 tablet (20 mg total) by mouth 2 (two) times daily. 02/28/18   Charlesetta Shanks, MD  HydrOXYzine Pamoate (VISTARIL PO) Take by mouth.    [provider]  losartan (COZAAR) 50 MG tablet Take by mouth. 04/07/15   [provider]  LOSARTAN POTASSIUM PO Take by mouth.    [provider]  metoCLOPramide (REGLAN) 10 MG tablet Take 1 tablet (10 mg total) by mouth every 6 (six) hours as needed for nausea (or headache). [id number]   Delora Fuel, MD  OMEPRAZOLE PO Take by mouth.    [provider]  orphenadrine (NORFLEX) 100 MG tablet Take 1 tablet (100 mg total) by mouth 2 (two) times daily. 12/07/19   Tacy Learn, PA-C  oxyCODONE (ROXICODONE) 15 MG immediate release tablet Take 15 mg by mouth every 4 (four) hours as needed for pain.    [provider]  oxyCODONE (ROXICODONE) 15 MG immediate release tablet Take by  mouth. 09/17/17   [provider]  pantoprazole (PROTONIX) 40 MG tablet Take by mouth. 09/06/16   [provider]  potassium chloride SA (K-DUR,KLOR-CON) 20 MEQ tablet Take 1 tablet (20 mEq total) by mouth daily. 02/28/18   Charlesetta Shanks, MD  predniSONE (DELTASONE) 50 MG tablet Take 1 tablet (50 mg total) by mouth daily. [id number]   Delora Fuel, MD  Pregabalin (LYRICA PO) Take by mouth.    [provider]  promethazine (PHENERGAN) 25 MG tablet Take 1 tablet (25 mg total) by mouth every 6 (six) hours as needed for nausea or vomiting. 02/26/18   Lawyer, Harrell Gave, PA-C  Rosuvastatin Calcium (CRESTOR PO) Take by mouth.    [provider]  sucralfate (CARAFATE) 1 g tablet Take 1 tablet (1 g total) by mouth 4 (four) times daily -  with meals and at  bedtime. 09/23/17   Ward, Ozella Almond, PA-C  sucralfate (CARAFATE) 1 GM/10ML suspension Take 10 mLs (1 g total) by mouth 4 (four) times daily -  with meals and at bedtime. 02/28/18   Charlesetta Shanks, MD    Allergies Other, Ivp dye [iodinated contrast media], Lipitor [atorvastatin], and Penicillins   REVIEW OF SYSTEMS  Negative except as noted here or in the History of Present Illness.   PHYSICAL EXAMINATION  Initial Vital Signs Blood pressure 124/80, pulse 66, temperature 98.4 F (36.9 C), temperature source Oral, resp. rate 15, SpO2 94 %.  Examination General: Well-developed, well-nourished female in no acute distress; appearance consistent with age of record HENT: normocephalic; atraumatic Eyes: Normal appearance Neck: supple Heart: regular rate and rhythm; no murmur Lungs: clear to auscultation bilaterally Abdomen: soft; nondistended; nontender; bowel sounds present Extremities: No deformity; full range of motion; pulses normal Neurologic: Awake, alert and oriented; motor function intact in all extremities and symmetric; no facial droop Skin: Warm and dry Psychiatric: Flat affect   RESULTS  Summary of this visit's results, reviewed and interpreted by myself:   EKG Interpretation  Date/Time:  Monday June 21 2021 01:07:14 EST Ventricular Rate:  69 PR Interval:  162 QRS Duration: 90 QT Interval:  403 QTC Calculation: 432 R Axis:   26 Text Interpretation: Sinus rhythm Abnormal R-wave progression, early transition Confirmed by Shanon Rosser [phone]) on 06/21/2021 1:27:38 AM       Laboratory Studies: Results for orders placed or performed during the hospital encounter of 06/21/21 (from the past 24 hour(s))  CBC with Differential/Platelet     Status: Abnormal   Collection Time: 06/21/21  2:07 AM  Result Value Ref Range   WBC 15.5 (H) 4.0 - 10.5 K/uL   RBC 3.88 3.87 - 5.11 MIL/uL   Hemoglobin 12.6 12.0 - 15.0 g/dL   HCT 34.5 (L) 36.0 - 46.0 %   MCV 88.9 80.0 -  100.0 fL   MCH 32.5 26.0 - 34.0 pg   MCHC 36.5 (H) 30.0 - 36.0 g/dL   RDW 14.0 11.5 - 15.5 %   Platelets 406 (H) 150 - 400 K/uL   nRBC 0.1 0.0 - 0.2 %   Neutrophils Relative % 81 %   Neutro Abs 12.7 (H) 1.7 - 7.7 K/uL   Lymphocytes Relative 12 %   Lymphs Abs 1.8 0.7 - 4.0 K/uL   Monocytes Relative 5 %   Monocytes Absolute 0.8 0.1 - 1.0 K/uL   Eosinophils Relative 1 %   Eosinophils Absolute 0.1 0.0 - 0.5 K/uL   Basophils Relative 0 %   Basophils Absolute 0.1 0.0 - 0.1 K/uL  Immature Granulocytes 1 %   Abs Immature Granulocytes 0.12 (H) 0.00 - 0.07 K/uL  Troponin I (High Sensitivity)     Status: Abnormal   Collection Time: 06/21/21  2:07 AM  Result Value Ref Range   Troponin I (High Sensitivity) 72 (H) <18 ng/L  Basic metabolic panel     Status: Abnormal   Collection Time: 06/21/21  2:07 AM  Result Value Ref Range   Sodium 134 (L) 135 - 145 mmol/L   Potassium 3.9 3.5 - 5.1 mmol/L   Chloride 99 98 - 111 mmol/L   CO2 26 22 - 32 mmol/L   Glucose, Bld 113 (H) 70 - 99 mg/dL   BUN 20 6 - 20 mg/dL   Creatinine, Ser 1.97 (H) 0.44 - 1.00 mg/dL   Calcium 10.0 8.9 - 10.3 mg/dL   GFR, Estimated 30 (L) >60 mL/min   Anion gap 9 5 - 15  D-dimer, quantitative     Status: Abnormal   Collection Time: 06/21/21  2:07 AM  Result Value Ref Range   D-Dimer, Quant 0.55 (H) 0.00 - 0.50 ug/mL-FEU  Brain natriuretic peptide     Status: None   Collection Time: 06/21/21  2:07 AM  Result Value Ref Range   B Natriuretic Peptide 61.9 0.0 - 100.0 pg/mL  Troponin I (High Sensitivity)     Status: Abnormal   Collection Time: 06/21/21  3:41 AM  Result Value Ref Range   Troponin I (High Sensitivity) 189 (HH) <18 ng/L   Imaging Studies: DG Chest 2 View  Result Date: 06/21/2021 CLINICAL DATA:  Chest pain EXAM: CHEST - 2 VIEW COMPARISON:  12/14/2019 FINDINGS: Cardiac and mediastinal contours are within normal limits. Mild dependent atelectasis. No other focal pulmonary opacity. No pleural effusion or  pneumothorax. No acute osseous abnormality. IMPRESSION: No acute cardiopulmonary process. Electronically Signed   By: Merilyn Baba M.D.   On: 06/21/2021 01:52    ED COURSE and MDM  Nursing notes, initial and subsequent vitals signs, including pulse oximetry, reviewed and interpreted by myself.  Vitals:   06/21/21 0315 06/21/21 0345 06/21/21 0400 06/21/21 0415  BP: 99/66 100/73 102/78 102/75  Pulse: 77 66 79 67  Resp: 15 14 14 15   Temp:      TempSrc:      SpO2: 95% 93% 96% 93%  Weight:      Height:       Medications  nitroGLYCERIN (NITROSTAT) SL tablet 0.4 mg (0.4 mg Sublingual Given 06/21/21 0226)  heparin ADULT infusion 100 units/mL (25000 units/272mL) (1,000 Units/hr Intravenous New Bag/Given 06/21/21 0338)  aspirin chewable tablet 324 mg (324 mg Oral Given 06/21/21 0209)  sodium chloride 0.9 % bolus 250 mL (0 mLs Intravenous Stopped 06/21/21 0227)  heparin bolus via infusion 4,000 Units (4,000 Units Intravenous Bolus from Bag 06/21/21 0338)   3:20 AM Pain relieved with 2 sublingual nitroglycerin tablets and patient is now pain-free.  Her first troponin is elevated, consistent with a non-ST elevation MI.  Her cardiologist is Dr. Donnetta Hutching at [facility] regional has no beds at this time and we will have her admitted to [facility] cardiology service.  3:35 AM Heparin bolus and drip started for non-STEMI.   3:46 AM Dr. Rudi Rummage accepts for admission to the cardiology department at [facility].  PROCEDURES  Procedures  CRITICAL CARE Performed by: Karen Chafe Mendy Lapinsky Total critical care time: 35 minutes Critical care time was exclusive of separately billable procedures and treating other patients. Critical care  was necessary to treat or prevent imminent or life-threatening deterioration. Critical care was time spent personally by me on the following activities: development of treatment plan with patient and/or surrogate as well as nursing, discussions with consultants,  evaluation of patient's response to treatment, examination of patient, obtaining history from patient or surrogate, ordering and performing treatments and interventions, ordering and review of laboratory studies, ordering and review of radiographic studies, pulse oximetry and re-evaluation of patient's condition.  ED DIAGNOSES     ICD-10-CM   1. NSTEMI (non-ST elevated myocardial infarction) ([place])  I21.4          Yidel Teuscher, MD 06/21/21 [phone]

## 2021-06-21 NOTE — H&P (Addendum)
Cardiology Admission History and Physical:   Patient ID: Paige Woodward MRN: [id number]; DOB: 09/28/1970   Admission date: 06/21/2021  PCP:  Trey Sailors, PA   [facility] Providers Cardiologist:  New (previously Dr. Darral Dash) , [place]   Chief Complaint:  Chest pain   Patient Profile:   Paige Woodward is a 51 y.o. female with CAD, hypertension, hyperlipidemia, stroke/TIA, fibromyalgia, possible atrial fibrillation, chronic pain syndrome and ongoing tobacco smoking who is being seen 06/21/2021 for the evaluation of chest pain and found to have elevated troponin.  Patient reported having non-STEMI about 15 years ago.  Underwent cardiac catheterization at [facility] hospital.  No stent was placed.  History of stroke in 2018.  Questionable atrial fibrillation at that time.  She was on Eliquis for few months and then discontinued.  Patient was followed by Dr. Randalyn Rhea.  Last seen by Dr. Elvis Coil in June 2018.  Echocardiogram in 2018 with normal LV function.  Negative bubble study.   History of Present Illness:   Paige Woodward had left upper sternal achy feeling yesterday afternoon while cooking.  She felt it could be acid reflux and took Alka-Seltzer plus with minimal improvement.  She continued to have on and off chest discomfort but it was got worse around 10 PM while in the kitchen.  It was 6 out of 10 intensity.  She describes her pain as achy pressure.  It radiated to her left arm with associated shortness of breath and diaphoresis.  She report it was not persistent but more frequent than earlier in the day.  She drove her to [place] where given sublingual nitroglycerin x2 with complete resolution of pain.  She also is on aspirin 324 mg.  Troponin came at 72>>189. Started on IV heparin and transported [facility] for further evaluation.   Patient reports recent exertional shortness of breath but attributed to her tobacco smoking. She smokes 1/2-1 pack of cigarette  every day since age 56.  Denied marijuana or cocaine abuse.  Patient denies palpitation, orthopnea, PND, syncope, lower extremity edema or melena.  Serum creatinine 1.97.  Patient reports never being told having kidney issue.  Her last creatinine was normal in 2021 in care everywhere.   Past Medical History:  Diagnosis Date   Fibromyalgia    GERD (gastroesophageal reflux disease)    Hypertension    MI (myocardial infarction) ([place])    Ulcerative colitis ([place])     Past Surgical History:  Procedure Laterality Date   ABDOMINAL HYSTERECTOMY     APPENDECTOMY     CARDIAC CATHETERIZATION     CHOLECYSTECTOMY     EYE SURGERY     SPLENECTOMY       Medications Prior to Admission: Prior to Admission medications   Medication Sig Start Date End Date Taking? Authorizing Provider  amLODipine (NORVASC) 10 MG tablet Take by mouth.    [provider]  AMLODIPINE BESYLATE PO Take by mouth.    [provider]  atenolol (TENORMIN) 100 MG tablet Take by mouth.    [provider]  ATENOLOL PO Take by mouth.    [provider]  benzonatate (TESSALON) 100 MG capsule Take 1 capsule (100 mg total) by mouth every 8 (eight) hours. 04/28/15   Larene Pickett, PA-C  busPIRone (BUSPAR) 30 MG tablet Take 30 mg by mouth 2 (two) times daily.    [provider]  cephALEXin (KEFLEX) 500 MG capsule Take 1 capsule (500 mg total) by mouth  3 (three) times daily. [id number]   Delora Fuel, MD  Dexlansoprazole (DEXILANT PO) Take by mouth.    [provider]  famotidine (PEPCID) 20 MG tablet Take 1 tablet (20 mg total) by mouth 2 (two) times daily. 02/28/18   Charlesetta Shanks, MD  HydrOXYzine Pamoate (VISTARIL PO) Take by mouth.    [provider]  losartan (COZAAR) 50 MG tablet Take by mouth. 04/07/15   [provider]  LOSARTAN POTASSIUM PO Take by mouth.    [provider]  metoCLOPramide (REGLAN) 10 MG tablet Take 1 tablet (10 mg total) by  mouth every 6 (six) hours as needed for nausea (or headache). [id number]   Delora Fuel, MD  OMEPRAZOLE PO Take by mouth.    [provider]  orphenadrine (NORFLEX) 100 MG tablet Take 1 tablet (100 mg total) by mouth 2 (two) times daily. 12/07/19   Tacy Learn, PA-C  oxyCODONE (ROXICODONE) 15 MG immediate release tablet Take 15 mg by mouth every 4 (four) hours as needed for pain.    [provider]  oxyCODONE (ROXICODONE) 15 MG immediate release tablet Take by mouth. 09/17/17   [provider]  pantoprazole (PROTONIX) 40 MG tablet Take by mouth. 09/06/16   [provider]  potassium chloride SA (K-DUR,KLOR-CON) 20 MEQ tablet Take 1 tablet (20 mEq total) by mouth daily. 02/28/18   Charlesetta Shanks, MD  predniSONE (DELTASONE) 50 MG tablet Take 1 tablet (50 mg total) by mouth daily. [id number]   Delora Fuel, MD  Pregabalin (LYRICA PO) Take by mouth.    [provider]  promethazine (PHENERGAN) 25 MG tablet Take 1 tablet (25 mg total) by mouth every 6 (six) hours as needed for nausea or vomiting. 02/26/18   Lawyer, Harrell Gave, PA-C  Rosuvastatin Calcium (CRESTOR PO) Take by mouth.    [provider]  sucralfate (CARAFATE) 1 g tablet Take 1 tablet (1 g total) by mouth 4 (four) times daily -  with meals and at bedtime. 09/23/17   Ward, Ozella Almond, PA-C  sucralfate (CARAFATE) 1 GM/10ML suspension Take 10 mLs (1 g total) by mouth 4 (four) times daily -  with meals and at bedtime. 02/28/18   Charlesetta Shanks, MD     Allergies:    Allergies  Allergen Reactions   Other Hives   Ivp Dye [Iodinated Contrast Media]    Lipitor [Atorvastatin]    Penicillins     Social History:   Social History   Socioeconomic History   Marital status: Married    Spouse name: Not on file   Number of children: Not on file   Years of education: Not on file   Highest education level: Not on file  Occupational History   Not on file  Tobacco Use   Smoking status: Every  Day    Types: Cigarettes   Smokeless tobacco: Never  Vaping Use   Vaping Use: Never used  Substance and Sexual Activity   Alcohol use: No   Drug use: No   Sexual activity: Not on file  Other Topics Concern   Not on file  Social History Narrative   Not on file   Social Determinants of Health   Financial Resource Strain: Not on file  Food Insecurity: Not on file  Transportation Needs: Not on file  Physical Activity: Not on file  Stress: Not on file  Social Connections: Not on file  Intimate Partner Violence: Not on file    Family History:   The patient's family  history includes Stroke in her father.    ROS:  Please see the history of present illness.  All other ROS reviewed and negative.     Physical Exam/Data:   Vitals:   06/21/21 0445 06/21/21 0530 06/21/21 0531 06/21/21 0616  BP: (!) 105/94 92/61  109/76  Pulse: 67 64  61  Resp: 15 16  14   Temp:   98.3 F (36.8 C) 98 F (36.7 C)  TempSrc:   Oral Oral  SpO2: 95% 94%  95%  Weight:    92 kg  Height:       No intake or output data in the 24 hours ending 06/21/21 0903 Last 3 Weights 06/21/2021 06/21/2021 09/21/2020  Weight (lbs) 202 lb 13.2 oz 199 lb 199 lb  Weight (kg) 92 kg 90.266 kg 90.266 kg     Body mass index is 31.77 kg/m.  General:  Well nourished, well developed, in no acute distress HEENT: normal Neck: no JVD Vascular: No carotid bruits; Distal pulses 2+ bilaterally   Cardiac:  normal S1, S2; RRR; no murmur  Lungs:  clear to auscultation bilaterally, no wheezing, rhonchi or rales  Abd: soft, nontender, no hepatomegaly  Ext: no edema Musculoskeletal:  No deformities, BUE and BLE strength normal and equal Skin: warm and dry  Neuro:  CNs 2-12 intact, no focal abnormalities noted Psych:  Normal affect    EKG:  The ECG that was done today was personally reviewed and demonstrates normal sinus rhythm, poor R wave progression  Relevant CV Studies:  Echo 10/2016 Summary  Negative Bubble Study  Normal  left ventricular size and systolic function with no appreciable  segmental abnormality.  Ejection fraction is visually estimated at 60-65%  Normal right ventricular size and function.  No significant valvular abnormalities  Bubble study negative for intra atrial shunt   Laboratory Data:  High Sensitivity Troponin:   Recent Labs  Lab 06/21/21 0207 06/21/21 0341  TROPONINIHS 72* 189*      Chemistry Recent Labs  Lab 06/21/21 0207  NA 134*  K 3.9  CL 99  CO2 26  GLUCOSE 113*  BUN 20  CREATININE 1.97*  CALCIUM 10.0  GFRNONAA 30*  ANIONGAP 9    Hematology Recent Labs  Lab 06/21/21 0207  WBC 15.5*  RBC 3.88  HGB 12.6  HCT 34.5*  MCV 88.9  MCH 32.5  MCHC 36.5*  RDW 14.0  PLT 406*   BNP Recent Labs  Lab 06/21/21 0207  BNP 61.9    DDimer  Recent Labs  Lab 06/21/21 0207  DDIMER 0.55*   Radiology/Studies:  DG Chest 2 View  Result Date: 06/21/2021 CLINICAL DATA:  Chest pain EXAM: CHEST - 2 VIEW COMPARISON:  12/14/2019 FINDINGS: Cardiac and mediastinal contours are within normal limits. Mild dependent atelectasis. No other focal pulmonary opacity. No pleural effusion or pneumothorax. No acute osseous abnormality. IMPRESSION: No acute cardiopulmonary process. Electronically Signed   By: Merilyn Baba M.D.   On: 06/21/2021 01:52     Assessment and Plan:   Non-STEMI -High sensitivity troponin 72>>189.  Intermittent chest pain episodes since yesterday.  Worse episode last night with associated shortness of breath and diaphoresis.  It was radiated to her left arm.  Chest pain-free since got 2 sublingual nitroglycerin.  She has received aspirin 324 mg.  Continue IV heparin. -She does have history of non-STEMI about 15 years ago.  Cardiac catheterization at that time without stent placement.  No details available.  Patient does have cardiac risk factors  with hypertension and long term tobacco smoking. -Patient will need ischemic evaluation.  We cannot do cardiac  catheterization today given elevated renal function.  She already had her breakfast.  No heart failure symptoms.  Will give gentle hydration and reevaluate renal function tomorrow.  Stress test versus cardiac catheterization tomorrow.  Get echocardiogram today. -Start aspirin 81 mg daily -Hold home atenolol given soft blood pressure  -Start Low dose Crestor (reported not taking at home) -Check hemoglobin A1c and lipid panel  2.  Hypertension -Blood pressure soft -Will hold home atenolol, Losartan and amlodipine  3. AKI -Serum creatinine 1.97.  Patient reports never being told having kidney issue.  Her last creatinine was normal in 2021 in care everywhere. - Gentle hydration  4. Chronic pain syndrome   5. Hx of stroke - Continue home Plavix  Risk Assessment/Risk Scores:    TIMI Risk Score for Unstable Angina or Non-ST Elevation MI:   The patient's TIMI risk score is 4, which indicates a 20% risk of all cause mortality, new or recurrent myocardial infarction or need for urgent revascularization in the next 14 days.{  Severity of Illness: The appropriate patient status for this patient is INPATIENT. Inpatient status is judged to be reasonable and necessary in order to provide the required intensity of service to ensure the patient's safety. The patient's presenting symptoms, physical exam findings, and initial radiographic and laboratory data in the context of their chronic comorbidities is felt to place them at high risk for further clinical deterioration. Furthermore, it is not anticipated that the patient will be medically stable for discharge from the hospital within 2 midnights of admission.   * I certify that at the point of admission it is my clinical judgment that the patient will require inpatient hospital care spanning beyond 2 midnights from the point of admission due to high intensity of service, high risk for further deterioration and high frequency of surveillance required.*    For questions or updates, please contact [place] Please consult [email or web address] for contact info under     Jarrett Soho, PA  06/21/2021 9:03 AM    Attending Note:   The patient was seen and examined.  Agree with assessment and plan as noted above.  Changes made to the above note as needed.  Patient seen and independently examined with Robbie Lis, PA .   We discussed all aspects of the encounter. I agree with the assessment and plan as stated above.    NSTEMI:   pt presents with stuttering chest pain that started last night.  She was originally seen at [facility] and was transferred to [facility] for further evaluation and management.  Troponin levels continue to gradually increase.  She will eventually need a heart catheterization.  Unfortunately her creatinine is elevated compared to her baseline.  We will treat her with gentle hydration to see if we can improve her creatinine. Echocardiogram has been ordered. Continue heparin.  We will give nitroglycerin as needed.  If she develops continued chest pain will consider low-dose nitro drip.  Her blood pressure is fairly low at this point so it would be difficult to start right now.  2.  Hypertension: Blood pressure is soft.  We will hold amlodipine and atenolol for now.  3.  Acute kidney injury: Her creatinine is 1.97.  She denies having any UTI symptoms.  Her last creatinine in 2021 was normal.  We will start gentle hydration.  Anticipate doing heart catheterization once  her renal function improves.  4.  History of cigarette smoking: We will write her for nicotine patch.  I have stressed the absolute importance of smoking cessation.    I have spent a total of 40 minutes with patient reviewing hospital  notes , telemetry, EKGs, labs and examining patient as well as establishing an assessment and plan that was discussed with the patient.  > 50% of time was spent in direct patient care.    Thayer Headings, Brooke Bonito., MD, [facility] 06/21/2021, 9:27 AM [address],  [place] Pager [phone]

## 2021-06-21 NOTE — ED Triage Notes (Signed)
Pt c/o L sided CP since ~2200 that is intermittent. Pt states she was cooking when pain started, and endorses increase of pain with deep inspiration. Pt also mentions diaphoresis but is not having any now. Rates pain 7/10.

## 2021-06-21 NOTE — Progress Notes (Signed)
ANTICOAGULATION CONSULT NOTE - Initial Consult  Pharmacy Consult for heparin  Indication: chest pain/ACS  Allergies  Allergen Reactions   Ivp Dye [Iodinated Contrast Media] Hives   Lipitor [Atorvastatin]     Leg pain   Penicillins     Childhood allergy    Patient Measurements: Height: 5\' 7"  (170.2 cm) Weight: 92 kg (202 lb 13.2 oz) IBW/kg (Calculated) : 61.6 HEPARIN DW (KG): 81   Vital Signs: Temp: 98.2 F (36.8 C) (02/06 1947) Temp Source: Oral (02/06 1947) BP: 106/76 (02/06 1947) Pulse Rate: 71 (02/06 1947)  Labs: Recent Labs    06/21/21 0207 06/21/21 0341 06/21/21 1057 06/21/21 1107 06/21/21 1307 06/21/21 2102  HGB 12.6  --   --   --   --   --   HCT 34.5*  --   --   --   --   --   PLT 406*  --   --   --   --   --   HEPARINUNFRC  --   --  0.23*  --   --  0.27*  CREATININE 1.97*  --   --   --   --   --   TROPONINIHS 72* 189*  --  2,002* 2,407*  --      Estimated Creatinine Clearance: 39.8 mL/min (A) (by C-G formula based on SCr of 1.97 mg/dL (H)).   Medical History: Past Medical History:  Diagnosis Date   Fibromyalgia    GERD (gastroesophageal reflux disease)    Hypertension    MI (myocardial infarction) ([place])    Ulcerative colitis ([place])     Medications:  Medications Prior to Admission  Medication Sig Dispense Refill Last Dose   acetaminophen (TYLENOL) 650 MG CR tablet Take 1,300 mg by mouth every 8 (eight) hours as needed for pain.   Past Week   amLODipine (NORVASC) 10 MG tablet Take 10 mg by mouth at bedtime.   06/20/2021   atenolol (TENORMIN) 100 MG tablet Take 100 mg by mouth at bedtime.   06/20/2021   calcium carbonate (ALKA-SELTZER HEARTBURN) 750 MG chewable tablet Chew 1,500 mg by mouth daily as needed for heartburn.   06/20/2021   clopidogrel (PLAVIX) 75 MG tablet Take 75 mg by mouth at bedtime.   06/20/2021 at 2300   hydrOXYzine (VISTARIL) 25 MG capsule Take 25 mg by mouth 2 (two) times daily.   06/20/2021   losartan (COZAAR) 50 MG tablet Take 50 mg  by mouth at bedtime.   06/20/2021   nortriptyline (PAMELOR) 25 MG capsule Take 25 mg by mouth at bedtime.   06/20/2021   omeprazole (PRILOSEC) 20 MG capsule Take 20 mg by mouth at bedtime as needed (heartburn/indigestion).   Past Week   oxyCODONE (ROXICODONE) 15 MG immediate release tablet Take 15 mg by mouth 3 (three) times daily as needed for pain.   06/20/2021   Scheduled:   [START ON 06/22/2021] aspirin  81 mg Oral Pre-Cath   aspirin EC  81 mg Oral Daily   clopidogrel  75 mg Oral QHS   [START ON 06/22/2021] diphenhydrAMINE  50 mg Oral Once   Or   [START ON 06/22/2021] diphenhydrAMINE  50 mg Intravenous Once   hydrOXYzine  25 mg Oral BID   nicotine  14 mg Transdermal Daily   nortriptyline  25 mg Oral QHS   pantoprazole  40 mg Oral Daily   predniSONE  50 mg Oral Q6H   rosuvastatin  5 mg Oral Daily   sodium chloride flush  3 mL Intravenous Q12H    Assessment: 51 yo female with CP on heparin at 1000 units/hr. Plans noted for stress test vs cath in 2/7. No anticoagulants noted PTA.   -hg= 12.6, plt= 406  Heparin came back subtherapeutic tonight at 0.27. We will increase rate and check in AM.  Goal of Therapy:  Heparin level 0.3-0.7 units/ml Monitor platelets by anticoagulation protocol: Yes   Plan:  -Increase heparin 1400 units/hr -Check heparin level in AM  Onnie Boer, PharmD, BCIDP, AAHIVP, CPP Infectious Disease Pharmacist 06/21/2021 9:50 PM

## 2021-06-21 NOTE — ED Notes (Signed)
Pt to radiology via stretcher by rad tech. 

## 2021-06-21 NOTE — Progress Notes (Addendum)
ANTICOAGULATION CONSULT NOTE - Initial Consult  Pharmacy Consult for heparin  Indication: chest pain/ACS  Allergies  Allergen Reactions   Other Hives   Ivp Dye [Iodinated Contrast Media]    Lipitor [Atorvastatin]    Penicillins     Patient Measurements: Height: 5\' 7"  (170.2 cm) Weight: 92 kg (202 lb 13.2 oz) IBW/kg (Calculated) : 61.6 HEPARIN DW (KG): 81   Vital Signs: Temp: 98 F (36.7 C) (02/06 0616) Temp Source: Oral (02/06 0616) BP: 109/76 (02/06 0616) Pulse Rate: 61 (02/06 0616)  Labs: Recent Labs    06/21/21 0207 06/21/21 0341  HGB 12.6  --   HCT 34.5*  --   PLT 406*  --   CREATININE 1.97*  --   TROPONINIHS 72* 189*    Estimated Creatinine Clearance: 39.8 mL/min (A) (by C-G formula based on SCr of 1.97 mg/dL (H)).   Medical History: Past Medical History:  Diagnosis Date   Fibromyalgia    GERD (gastroesophageal reflux disease)    Hypertension    MI (myocardial infarction) ([place])    Ulcerative colitis ([place])     Medications:  Medications Prior to Admission  Medication Sig Dispense Refill Last Dose   amLODipine (NORVASC) 10 MG tablet Take by mouth.      atenolol (TENORMIN) 100 MG tablet Take by mouth.      ATENOLOL PO Take by mouth.      benzonatate (TESSALON) 100 MG capsule Take 1 capsule (100 mg total) by mouth every 8 (eight) hours. 30 capsule 0    busPIRone (BUSPAR) 30 MG tablet Take 30 mg by mouth 2 (two) times daily.      cephALEXin (KEFLEX) 500 MG capsule Take 1 capsule (500 mg total) by mouth 3 (three) times daily. 30 capsule 0    Dexlansoprazole (DEXILANT PO) Take by mouth.      famotidine (PEPCID) 20 MG tablet Take 1 tablet (20 mg total) by mouth 2 (two) times daily. 30 tablet 0    HydrOXYzine Pamoate (VISTARIL PO) Take by mouth.      losartan (COZAAR) 50 MG tablet Take by mouth.      LOSARTAN POTASSIUM PO Take by mouth.      metoCLOPramide (REGLAN) 10 MG tablet Take 1 tablet (10 mg total) by mouth every 6 (six) hours as needed for nausea  (or headache). 30 tablet 0    OMEPRAZOLE PO Take by mouth.      orphenadrine (NORFLEX) 100 MG tablet Take 1 tablet (100 mg total) by mouth 2 (two) times daily. 20 tablet 0    oxyCODONE (ROXICODONE) 15 MG immediate release tablet Take 15 mg by mouth every 4 (four) hours as needed for pain.      oxyCODONE (ROXICODONE) 15 MG immediate release tablet Take by mouth.      pantoprazole (PROTONIX) 40 MG tablet Take by mouth.      potassium chloride SA (K-DUR,KLOR-CON) 20 MEQ tablet Take 1 tablet (20 mEq total) by mouth daily. 7 tablet 0    predniSONE (DELTASONE) 50 MG tablet Take 1 tablet (50 mg total) by mouth daily. 5 tablet 0    Pregabalin (LYRICA PO) Take by mouth.      promethazine (PHENERGAN) 25 MG tablet Take 1 tablet (25 mg total) by mouth every 6 (six) hours as needed for nausea or vomiting. 10 tablet 0    Rosuvastatin Calcium (CRESTOR PO) Take by mouth.      sucralfate (CARAFATE) 1 g tablet Take 1 tablet (1 g total) by mouth 4 (  four) times daily -  with meals and at bedtime. 90 tablet 0    sucralfate (CARAFATE) 1 GM/10ML suspension Take 10 mLs (1 g total) by mouth 4 (four) times daily -  with meals and at bedtime. 420 mL 0    Scheduled:   Assessment: 51 yo female with CP on heparin at 1000 units/hr. Plans noted for stress test vs cath in 2/7. No anticoagulants noted PTA.   -hg= 12.6, plt= 406   Goal of Therapy:  Heparin level 0.3-0.7 units/ml Monitor platelets by anticoagulation protocol: Yes   Plan:  -Continue heparin at 1000 units/hr -Check heparin level this morning  Hildred Laser, PharmD Clinical Pharmacist **Pharmacist phone directory can now be found on [email or web address] (PW [email or web address]).  Listed under [place].  Addendum -heparin level= 0.23 -heparin bolus 1000 units then increase to 1200 units/hr -heparin level in 8 hrs  Hildred Laser, PharmD Clinical Pharmacist **Pharmacist phone directory can now be found on [email or web address] (PW [email or web address]).  Listed under [place].

## 2021-06-21 NOTE — Progress Notes (Signed)
Paged admitting that patient has arrived to unit.

## 2021-06-21 NOTE — Progress Notes (Signed)
Date and time results received: 06/21/21 1430 (use smartphrase ".now" to insert current time)  Test: troponin Critical Value: 2002  Name of Provider Notified: page sent to [place], [place] with cardiology via Amion   Orders Received? Or Actions Taken?: no new orders at this time

## 2021-06-22 ENCOUNTER — Encounter (HOSPITAL_COMMUNITY)
Admission: EM | Disposition: A | Discharge status: Home / Self Care | Payer: Self-pay | Source: Home / Self Care | Attending: Cardiovascular Disease

## 2021-06-22 ENCOUNTER — Encounter (HOSPITAL_COMMUNITY): Payer: Self-pay | Admitting: Cardiovascular Disease

## 2021-06-22 ENCOUNTER — Telehealth (HOSPITAL_BASED_OUTPATIENT_CLINIC_OR_DEPARTMENT_OTHER): Payer: Self-pay | Admitting: Family

## 2021-06-22 DIAGNOSIS — I10 Essential (primary) hypertension: Secondary | ICD-10-CM

## 2021-06-22 DIAGNOSIS — R7989 Other specified abnormal findings of blood chemistry: Secondary | ICD-10-CM

## 2021-06-22 DIAGNOSIS — R778 Other specified abnormalities of plasma proteins: Secondary | ICD-10-CM

## 2021-06-22 DIAGNOSIS — I214 Non-ST elevation (NSTEMI) myocardial infarction: Secondary | ICD-10-CM | POA: Diagnosis not present

## 2021-06-22 HISTORY — PX: LEFT HEART CATH AND CORONARY ANGIOGRAPHY: CATH118249

## 2021-06-22 LAB — BASIC METABOLIC PANEL
Anion gap: 7 (ref 5–15)
BUN: 14 mg/dL (ref 6–20)
CO2: 23 mmol/L (ref 22–32)
Calcium: 9.1 mg/dL (ref 8.9–10.3)
Chloride: 107 mmol/L (ref 98–111)
Creatinine, Ser: 1.09 mg/dL — ABNORMAL HIGH (ref 0.44–1.00)
GFR, Estimated: >60 mL/min (ref >60)
Glucose, Bld: 139 mg/dL — ABNORMAL HIGH (ref 70–99)
Potassium: 4.3 mmol/L (ref 3.5–5.1)
Sodium: 137 mmol/L (ref 135–145)

## 2021-06-22 LAB — HEPARIN LEVEL (UNFRACTIONATED): Heparin Unfractionated: 0.47 IU/mL (ref 0.30–0.70)

## 2021-06-22 LAB — CBC
HCT: 36.4 % (ref 36.0–46.0)
Hemoglobin: 13.2 g/dL (ref 12.0–15.0)
MCH: 32.4 pg (ref 26.0–34.0)
MCHC: 36.3 g/dL — ABNORMAL HIGH (ref 30.0–36.0)
MCV: 89.4 fL (ref 80.0–100.0)
Platelets: 415 10*3/uL — ABNORMAL HIGH (ref 150–400)
RBC: 4.07 MIL/uL (ref 3.87–5.11)
RDW: 13.6 % (ref 11.5–15.5)
WBC: 12.5 10*3/uL — ABNORMAL HIGH (ref 4.0–10.5)
nRBC: 0 % (ref 0.0–0.2)

## 2021-06-22 LAB — LIPID PANEL
Cholesterol: 180 mg/dL (ref 0–200)
HDL: 38 mg/dL — ABNORMAL LOW (ref >40)
LDL Cholesterol: 133 mg/dL — ABNORMAL HIGH (ref 0–99)
Total CHOL/HDL Ratio: 4.7 RATIO
Triglycerides: 46 mg/dL (ref <150)
VLDL: 9 mg/dL (ref 0–40)

## 2021-06-22 SURGERY — LEFT HEART CATH AND CORONARY ANGIOGRAPHY
Anesthesia: LOCAL

## 2021-06-22 MED ORDER — HEPARIN SODIUM (PORCINE) 1000 UNIT/ML IJ SOLN
INTRAMUSCULAR | Status: DC | PRN
  Administered 2021-06-22: 4500 [IU] via INTRAVENOUS

## 2021-06-22 MED ORDER — MIDAZOLAM HCL 2 MG/2ML IJ SOLN
INTRAMUSCULAR | Status: AC
  Filled 2021-06-22: qty 2

## 2021-06-22 MED ORDER — ENOXAPARIN SODIUM 40 MG/0.4ML IJ SOSY: 40.0000 mg | PREFILLED_SYRINGE | INTRAMUSCULAR | Status: DC

## 2021-06-22 MED ORDER — IOHEXOL 350 MG/ML SOLN
INTRAVENOUS | Status: DC | PRN
  Administered 2021-06-22: 45 mg

## 2021-06-22 MED ORDER — VERAPAMIL HCL 2.5 MG/ML IV SOLN
INTRAVENOUS | Status: DC | PRN
  Administered 2021-06-22 (×2): 10 mL via INTRA_ARTERIAL

## 2021-06-22 MED ORDER — SODIUM CHLORIDE 0.9 % IV SOLN: 250.0000 mL | INTRAVENOUS | Status: DC | PRN

## 2021-06-22 MED ORDER — FENTANYL CITRATE (PF) 100 MCG/2ML IJ SOLN
INTRAMUSCULAR | Status: AC
  Filled 2021-06-22: qty 2

## 2021-06-22 MED ORDER — MIDAZOLAM HCL 2 MG/2ML IJ SOLN
INTRAMUSCULAR | Status: DC | PRN
  Administered 2021-06-22 (×2): 1 mg via INTRAVENOUS
  Administered 2021-06-22: 2 mg via INTRAVENOUS

## 2021-06-22 MED ORDER — PANTOPRAZOLE SODIUM 40 MG PO TBEC: 40.0000 mg | DELAYED_RELEASE_TABLET | Freq: Every day | ORAL | 3 refills | Status: DC

## 2021-06-22 MED ORDER — FENTANYL CITRATE (PF) 100 MCG/2ML IJ SOLN
INTRAMUSCULAR | Status: DC | PRN
  Administered 2021-06-22 (×2): 25 ug via INTRAVENOUS

## 2021-06-22 MED ORDER — HEPARIN (PORCINE) IN NACL 1000-0.9 UT/500ML-% IV SOLN
INTRAVENOUS | Status: DC | PRN
  Administered 2021-06-22 (×2): 500 mL

## 2021-06-22 MED ORDER — HEPARIN (PORCINE) IN NACL 1000-0.9 UT/500ML-% IV SOLN
INTRAVENOUS | Status: AC
  Filled 2021-06-22: qty 1000

## 2021-06-22 MED ORDER — SODIUM CHLORIDE 0.9% FLUSH: 3.0000 mL | INTRAVENOUS | Status: DC | PRN

## 2021-06-22 MED ORDER — LIDOCAINE HCL (PF) 1 % IJ SOLN
INTRAMUSCULAR | Status: DC | PRN
  Administered 2021-06-22: 2 mL

## 2021-06-22 MED ORDER — LIDOCAINE HCL (PF) 1 % IJ SOLN
INTRAMUSCULAR | Status: AC
  Filled 2021-06-22: qty 30

## 2021-06-22 MED ORDER — SODIUM CHLORIDE 0.9 % IV SOLN: INTRAVENOUS | Status: DC

## 2021-06-22 MED ORDER — ONDANSETRON HCL 4 MG/2ML IJ SOLN: 4.0000 mg | Freq: Four times a day (QID) | INTRAMUSCULAR | Status: DC | PRN

## 2021-06-22 MED ORDER — HYDRALAZINE HCL 20 MG/ML IJ SOLN: 10.0000 mg | INTRAMUSCULAR | Status: AC | PRN

## 2021-06-22 MED ORDER — VERAPAMIL HCL 2.5 MG/ML IV SOLN
INTRAVENOUS | Status: AC
  Filled 2021-06-22: qty 2

## 2021-06-22 MED ORDER — SODIUM CHLORIDE 0.9% FLUSH
3.0000 mL | Freq: Two times a day (BID) | INTRAVENOUS | Status: DC
  Administered 2021-06-22: 3 mL via INTRAVENOUS

## 2021-06-22 MED ORDER — HEPARIN SODIUM (PORCINE) 1000 UNIT/ML IJ SOLN
INTRAMUSCULAR | Status: AC
  Filled 2021-06-22: qty 10

## 2021-06-22 MED ORDER — ACETAMINOPHEN 325 MG PO TABS: 650.0000 mg | ORAL_TABLET | ORAL | Status: DC | PRN

## 2021-06-22 MED ORDER — LABETALOL HCL 5 MG/ML IV SOLN: 10.0000 mg | INTRAVENOUS | Status: AC | PRN

## 2021-06-22 SURGICAL SUPPLY — 11 items
CATH 5FR JL3.5 JR4 ANG PIG MP (CATHETERS) ×1 IMPLANT
CATH INFINITI 5FR JL4 (CATHETERS) ×1 IMPLANT
DEVICE RAD COMP TR BAND LRG (VASCULAR PRODUCTS) ×1 IMPLANT
GLIDESHEATH SLEND SS 6F .021 (SHEATH) ×1 IMPLANT
GUIDEWIRE INQWIRE 1.5J.035X260 (WIRE) IMPLANT
INQWIRE 1.5J .035X260CM (WIRE) ×2
KIT HEART LEFT (KITS) ×2 IMPLANT
PACK CARDIAC CATHETERIZATION (CUSTOM PROCEDURE TRAY) ×2 IMPLANT
SHEATH PROBE COVER 6X72 (BAG) ×1 IMPLANT
TRANSDUCER W/STOPCOCK (MISCELLANEOUS) ×2 IMPLANT
TUBING CIL FLEX 10 FLL-RA (TUBING) ×2 IMPLANT

## 2021-06-22 NOTE — Plan of Care (Signed)
  Problem: Education: Goal: Knowledge of General Education information will improve Description: Including pain rating scale, medication(s)/side effects and non-pharmacologic comfort measures Outcome: Progressing   Problem: Health Behavior/Discharge Planning: Goal: Ability to manage health-related needs will improve Outcome: Progressing   Problem: Activity: Goal: Risk for activity intolerance will decrease Outcome: Progressing   

## 2021-06-22 NOTE — Telephone Encounter (Signed)
TOC Patient- Please call Patient- Patient have an appointment with Gillian Shields on 07-06-21.

## 2021-06-22 NOTE — Progress Notes (Addendum)
Progress Note  Patient Name: Paige Woodward Date of Encounter: 06/22/2021  [facility] Cardiologist: Mertie Moores, MD (previously Dr. Darral Dash)     Subjective   Feeling well. No chest pain, sob or palpitations.  Ready for cath  Cr is 1.09    Inpatient Medications    Scheduled Meds:  aspirin EC  81 mg Oral Daily   clopidogrel  75 mg Oral QHS   hydrOXYzine  25 mg Oral BID   nicotine  14 mg Transdermal Daily   nortriptyline  25 mg Oral QHS   pantoprazole  40 mg Oral Daily   rosuvastatin  5 mg Oral Daily   sodium chloride flush  3 mL Intravenous Q12H   Continuous Infusions:  sodium chloride 75 mL/hr at 06/22/21 0010   sodium chloride     sodium chloride     heparin 1,400 Units/hr (06/22/21 0008)   PRN Meds: sodium chloride, acetaminophen, nitroGLYCERIN, ondansetron (ZOFRAN) IV, oxyCODONE, sodium chloride flush   Vital Signs    Vitals:   06/21/21 1947 06/21/21 2335 06/22/21 0457 06/22/21 0722  BP: 106/76 112/66 104/60 114/81  Pulse: 71 71 62   Resp: 16 11 12 16   Temp: 98.2 F (36.8 C) 98 F (36.7 C) 98 F (36.7 C) 98 F (36.7 C)  TempSrc: Oral Oral Oral Oral  SpO2: 96% 94% 96%   Weight:      Height:        Intake/Output Summary (Last 24 hours) at 06/22/2021 0803 Last data filed at 06/21/2021 1923 Gross per 24 hour  Intake 240 ml  Output --  Net 240 ml   Last 3 Weights 06/21/2021 06/21/2021 09/21/2020  Weight (lbs) 202 lb 13.2 oz 199 lb 199 lb  Weight (kg) 92 kg 90.266 kg 90.266 kg      Telemetry    NSR   ECG    SR, TWI in leads III, aVL - Personally Reviewed  Physical Exam   Physical Exam: Blood pressure 114/81, pulse 62, temperature 98 F (36.7 C), temperature source Oral, resp. rate 16, height 5\' 7"  (1.702 m), weight 92 kg, SpO2 96 %.  GEN:  Well nourished, well developed in no acute distress HEENT: Normal NECK: No JVD; No carotid bruits LYMPHATICS: No lymphadenopathy CARDIAC: RRR   RESPIRATORY:  Clear to auscultation without rales, wheezing  or rhonchi  ABDOMEN: Soft, non-tender, non-distended MUSCULOSKELETAL:  No edema; No deformity  SKIN: Warm and dry NEUROLOGIC:  Alert and oriented x 3   Labs    High Sensitivity Troponin:   Recent Labs  Lab 06/21/21 0207 06/21/21 0341 06/21/21 1107 06/21/21 1307  TROPONINIHS 72* 189* 2,002* 2,407*     Chemistry Recent Labs  Lab 06/21/21 0207 06/22/21 0614  NA 134* 137  K 3.9 4.3  CL 99 107  CO2 26 23  GLUCOSE 113* 139*  BUN 20 14  CREATININE 1.97* 1.09*  CALCIUM 10.0 9.1  GFRNONAA 30* >60  ANIONGAP 9 7     Hematology Recent Labs  Lab 06/21/21 0207 06/22/21 0614  WBC 15.5* 12.5*  RBC 3.88 4.07  HGB 12.6 13.2  HCT 34.5* 36.4  MCV 88.9 89.4  MCH 32.5 32.4  MCHC 36.5* 36.3*  RDW 14.0 13.6  PLT 406* 415*   Thyroid  Recent Labs  Lab 06/21/21 1107  TSH 1.331    BNP Recent Labs  Lab 06/21/21 0207  BNP 61.9    DDimer  Recent Labs  Lab 06/21/21 0207  DDIMER 0.55*     Radiology  DG Chest 2 View  Result Date: 06/21/2021 CLINICAL DATA:  Chest pain EXAM: CHEST - 2 VIEW COMPARISON:  12/14/2019 FINDINGS: Cardiac and mediastinal contours are within normal limits. Mild dependent atelectasis. No other focal pulmonary opacity. No pleural effusion or pneumothorax. No acute osseous abnormality. IMPRESSION: No acute cardiopulmonary process. Electronically Signed   By: Merilyn Baba M.D.   On: 06/21/2021 01:52   ECHOCARDIOGRAM COMPLETE  Result Date: 06/21/2021    ECHOCARDIOGRAM REPORT   Patient Name:   Paige Woodward Date of Exam: 06/21/2021 Medical Rec #:  [id number]     Height:       67.0 in Accession #:    [id number]    Weight:       202.8 lb Date of Birth:  [birth year]     BSA:          2.034 m Patient Age:    51 years      BP:           109/76 mmHg Patient Gender: F             HR:           61 bpm. Exam Location:  Inpatient Procedure: 2D Echo Indications:    NSTEMI  History:        Patient has no prior history of Echocardiogram examinations.                 Acute  MI; Risk Factors:Hypertension.  Sonographer:    Arlyss Gandy Referring Phys: [id number] Crista Luria BHAGAT IMPRESSIONS  1. Left ventricular ejection fraction, by estimation, is 60 to 65%. The left ventricle has normal function. The left ventricle has no regional wall motion abnormalities. Left ventricular diastolic parameters are consistent with Grade I diastolic dysfunction (impaired relaxation).  2. Right ventricular systolic function is normal. The right ventricular size is normal. There is normal pulmonary artery systolic pressure.  3. The mitral valve is normal in structure. Trivial mitral valve regurgitation. No evidence of mitral stenosis.  4. The aortic valve is normal in structure. Aortic valve regurgitation is not visualized. No aortic stenosis is present.  5. The inferior vena cava is normal in size with greater than 50% respiratory variability, suggesting right atrial pressure of 3 mmHg. FINDINGS  Left Ventricle: Left ventricular ejection fraction, by estimation, is 60 to 65%. The left ventricle has normal function. The left ventricle has no regional wall motion abnormalities. The left ventricular internal cavity size was normal in size. There is  no left ventricular hypertrophy. Left ventricular diastolic parameters are consistent with Grade I diastolic dysfunction (impaired relaxation). Right Ventricle: The right ventricular size is normal. No increase in right ventricular wall thickness. Right ventricular systolic function is normal. There is normal pulmonary artery systolic pressure. The tricuspid regurgitant velocity is 2.06 m/s, and  with an assumed right atrial pressure of 3 mmHg, the estimated right ventricular systolic pressure is 0000000 mmHg. Left Atrium: Left atrial size was normal in size. Right Atrium: Right atrial size was normal in size. Pericardium: There is no evidence of pericardial effusion. Mitral Valve: The mitral valve is normal in structure. Trivial mitral valve regurgitation. No  evidence of mitral valve stenosis. Tricuspid Valve: The tricuspid valve is normal in structure. Tricuspid valve regurgitation is mild . No evidence of tricuspid stenosis. Aortic Valve: The aortic valve is normal in structure. Aortic valve regurgitation is not visualized. No aortic stenosis is present. Aortic valve mean gradient measures 5.0 mmHg. Aortic valve peak gradient measures 9.2 mmHg.  Aortic valve area, by VTI measures 2.65 cm. Pulmonic Valve: The pulmonic valve was normal in structure. Pulmonic valve regurgitation is not visualized. No evidence of pulmonic stenosis. Aorta: The aortic root is normal in size and structure. Venous: The inferior vena cava is normal in size with greater than 50% respiratory variability, suggesting right atrial pressure of 3 mmHg. IAS/Shunts: No atrial level shunt detected by color flow Doppler.  LEFT VENTRICLE PLAX 2D LVIDd:         4.70 cm   Diastology LVIDs:         3.30 cm   LV e' medial:    10.00 cm/s LV PW:         0.80 cm   LV E/e' medial:  8.4 LV IVS:        0.80 cm   LV e' lateral:   10.30 cm/s LVOT diam:     2.00 cm   LV E/e' lateral: 8.2 LV SV:         92 LV SV Index:   45 LVOT Area:     3.14 cm  RIGHT VENTRICLE             IVC RV Basal diam:  3.90 cm     IVC diam: 1.80 cm RV Mid diam:    2.90 cm RV S prime:     14.10 cm/s TAPSE (M-mode): 3.0 cm LEFT ATRIUM             Index        RIGHT ATRIUM           Index LA diam:        3.30 cm 1.62 cm/m   RA Area:     14.40 cm LA Vol (A2C):   52.4 ml 25.76 ml/m  RA Volume:   34.70 ml  17.06 ml/m LA Vol (A4C):   47.6 ml 23.40 ml/m LA Biplane Vol: 50.3 ml 24.73 ml/m  AORTIC VALVE AV Area (Vmax):    2.83 cm AV Area (Vmean):   2.74 cm AV Area (VTI):     2.65 cm AV Vmax:           152.00 cm/s AV Vmean:          103.000 cm/s AV VTI:            0.348 m AV Peak Grad:      9.2 mmHg AV Mean Grad:      5.0 mmHg LVOT Vmax:         137.00 cm/s LVOT Vmean:        89.700 cm/s LVOT VTI:          0.293 m LVOT/AV VTI ratio: 0.84  AORTA  Ao Root diam: 2.90 cm Ao Asc diam:  3.20 cm MITRAL VALVE               TRICUSPID VALVE MV Area (PHT): 3.16 cm    TR Peak grad:   17.0 mmHg MV Decel Time: 240 msec    TR Vmax:        206.00 cm/s MV E velocity: 84.00 cm/s MV A velocity: 98.50 cm/s  SHUNTS MV E/A ratio:  0.85        Systemic VTI:  0.29 m                            Systemic Diam: 2.00 cm Candee Furbish MD Electronically signed by Candee Furbish MD Signature Date/Time: 06/21/2021/4:33:08 PM  Final     Cardiac Studies    Echo 06/21/21  1. Left ventricular ejection fraction, by estimation, is 60 to 65%. The  left ventricle has normal function. The left ventricle has no regional  wall motion abnormalities. Left ventricular diastolic parameters are  consistent with Grade I diastolic  dysfunction (impaired relaxation).   2. Right ventricular systolic function is normal. The right ventricular  size is normal. There is normal pulmonary artery systolic pressure.   3. The mitral valve is normal in structure. Trivial mitral valve  regurgitation. No evidence of mitral stenosis.   4. The aortic valve is normal in structure. Aortic valve regurgitation is  not visualized. No aortic stenosis is present.   5. The inferior vena cava is normal in size with greater than 50%  respiratory variability, suggesting right atrial pressure of 3 mmHg.   Patient Profile     51 y.o. female  with reported CAD, hypertension, hyperlipidemia, stroke/TIA, fibromyalgia, possible atrial fibrillation, chronic pain syndrome and ongoing tobacco smoking who is being seen 06/21/2021 for the evaluation of chest pain and found to have elevated troponin.  Assessment & Plan    Non-STEMI -High sensitivity troponin 72>>189>>2002>>2407.  Chest pain-free since got 2 sublingual nitroglycerin in ED.  Treated with IV heparin. -Plan for LHC today  -Started aspirin 81 mg daily. On plavix for stroke.  -Hold home atenolol given soft blood pressure  -Start Low dose Crestor (reported not  taking at home, she is intolerance to Lipitor) -Pending lipid panel   2.  Hypertension -Blood pressure soft -Holding home atenolol, Losartan and amlodipine   3. AKI -Serum creatinine 1.97 on admit. Resolved with hydration.    4. Chronic pain syndrome    5. Hx of stroke - Continue home Plavix  For questions or updates, please contact [place] Please consult [email or web address] for contact info under        SignedLeanor Kail, PA  06/22/2021, 8:03 AM     Attending Note:   The patient was seen and examined.  Agree with assessment and plan as noted above.  Changes made to the above note as needed.  Patient seen and independently examined with  Robbie Lis, PA .   We discussed all aspects of the encounter. I agree with the assessment and plan as stated above.    NSTEMI:  troponins peaked around 2400.  For cath today .   Yesterday We discussed the procedure, risks, benefits, options.  She understands and does not have any additional questions   2.  HTN:  well controlled at this point .   3.  AKI:  creatinine has improved.     I have spent a total of 40 minutes with patient reviewing hospital  notes , telemetry, EKGs, labs and examining patient as well as establishing an assessment and plan that was discussed with the patient.  > 50% of time was spent in direct patient care.    Thayer Headings, Brooke Bonito., MD, [facility] 06/22/2021, 8:20 AM [address]  [place] Pager [phone]

## 2021-06-22 NOTE — TOC Initial Note (Signed)
Transition of Care [facility]) - Initial/Assessment Note    Patient Details  Name: Kallin Bolinsky MRN: [id number] Date of Birth: 10-07-1970  Transition of [facility]) CM/SW Contact:    Angelita Ingles, RN Phone [phone]  06/22/2021, 11:40 AM  Clinical Narrative:                  Transition of Care (TOC) Screening Note   Patient Details  Name: Cytlali Bizzell Date of Birth: [birth year]   Transition of Care [facility]) CM/SW Contact:    Angelita Ingles, RN Phone Number: 06/22/2021, 11:41 AM    Transition of Care Department [facility]) has reviewed patient and no TOC needs have been identified at this time. We will continue to monitor patient advancement through interdisciplinary progression rounds. If new patient transition needs arise, please place a TOC consult.          Patient Goals and CMS Choice        Expected Discharge Plan and Services                                                Prior Living Arrangements/Services                       Activities of Daily Living      Permission Sought/Granted                  Emotional Assessment              Admission diagnosis:  Non-STEMI (non-ST elevated myocardial infarction) ([place]) [I21.4] NSTEMI (non-ST elevated myocardial infarction) [facility]) [I21.4] Patient Active Problem List   Diagnosis Date Noted   Elevated troponin    Non-STEMI (non-ST elevated myocardial infarction) ([place]) 06/21/2021   NSTEMI (non-ST elevated myocardial infarction) ([place]) 0000000   Embolic infarction ([place]) 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   Acute back pain with sciatica, left 12/16/2019   Stroke-like symptom 12/14/2019   History of ischemic left MCA stroke 09/03/2019   Cervical radiculopathy at C6 12/25/2018   Chronic pain syndrome 04/05/2018   Hypercoagulable state ([place]) 11/10/2016   Acute ischemic left MCA stroke ([place]) 10/20/2016   TIA (transient ischemic attack) 10/20/2016   Leucocytosis 02/25/2016   CAD (coronary artery disease)  03/20/2015   HTN (hypertension) 03/20/2015   Old MI (myocardial infarction) 03/20/2015   Abdominal pain 02/15/2011   Chest pain 02/15/2011   Fibromyalgia 02/15/2011   Irritable bowel syndrome 02/15/2011   Mild acid reflux 02/15/2011   PCP:  Trey Sailors, PA Pharmacy:   [id number] - [place], [place] - [address] AT [place] [address] [place] [address] Phone: [phone] Fax: [phone]     Social Determinants of Health (SDOH) Interventions    Readmission Risk Interventions No flowsheet data found.

## 2021-06-22 NOTE — Progress Notes (Signed)
ANTICOAGULATION CONSULT NOTE  Pharmacy Consult for heparin  Indication: chest pain/ACS  Allergies  Allergen Reactions   Ivp Dye [Iodinated Contrast Media] Hives   Lipitor [Atorvastatin]     Leg pain   Penicillins     Childhood allergy    Patient Measurements: Height: 5\' 7"  (170.2 cm) Weight: 92 kg (202 lb 13.2 oz) IBW/kg (Calculated) : 61.6 HEPARIN DW (KG): 81   Vital Signs: Temp: 98 F (36.7 C) (02/07 0722) Temp Source: Oral (02/07 0722) BP: 114/81 (02/07 0722) Pulse Rate: 62 (02/07 0457)  Labs: Recent Labs    06/21/21 0207 06/21/21 0341 06/21/21 1057 06/21/21 1107 06/21/21 1307 06/21/21 2102 06/22/21 0614  HGB 12.6  --   --   --   --   --  13.2  HCT 34.5*  --   --   --   --   --  36.4  PLT 406*  --   --   --   --   --  415*  HEPARINUNFRC  --   --  0.23*  --   --  0.27* 0.47  CREATININE 1.97*  --   --   --   --   --  1.09*  TROPONINIHS 72* 189*  --  2,002* 2,407*  --   --      Estimated Creatinine Clearance: 71.9 mL/min (A) (by C-G formula based on SCr of 1.09 mg/dL (H)).   Medical History: Past Medical History:  Diagnosis Date   Fibromyalgia    GERD (gastroesophageal reflux disease)    Hypertension    MI (myocardial infarction) ([place])    Ulcerative colitis ([place])     Medications:  Medications Prior to Admission  Medication Sig Dispense Refill Last Dose   acetaminophen (TYLENOL) 650 MG CR tablet Take 1,300 mg by mouth every 8 (eight) hours as needed for pain.   Past Week   amLODipine (NORVASC) 10 MG tablet Take 10 mg by mouth at bedtime.   06/20/2021   atenolol (TENORMIN) 100 MG tablet Take 100 mg by mouth at bedtime.   06/20/2021   calcium carbonate (ALKA-SELTZER HEARTBURN) 750 MG chewable tablet Chew 1,500 mg by mouth daily as needed for heartburn.   06/20/2021   clopidogrel (PLAVIX) 75 MG tablet Take 75 mg by mouth at bedtime.   06/20/2021 at 2300   hydrOXYzine (VISTARIL) 25 MG capsule Take 25 mg by mouth 2 (two) times daily.   06/20/2021   losartan  (COZAAR) 50 MG tablet Take 50 mg by mouth at bedtime.   06/20/2021   nortriptyline (PAMELOR) 25 MG capsule Take 25 mg by mouth at bedtime.   06/20/2021   omeprazole (PRILOSEC) 20 MG capsule Take 20 mg by mouth at bedtime as needed (heartburn/indigestion).   Past Week   oxyCODONE (ROXICODONE) 15 MG immediate release tablet Take 15 mg by mouth 3 (three) times daily as needed for pain.   06/20/2021   Scheduled:   aspirin EC  81 mg Oral Daily   clopidogrel  75 mg Oral QHS   hydrOXYzine  25 mg Oral BID   nicotine  14 mg Transdermal Daily   nortriptyline  25 mg Oral QHS   pantoprazole  40 mg Oral Daily   rosuvastatin  5 mg Oral Daily   sodium chloride flush  3 mL Intravenous Q12H    Assessment: 51 yo female with CP on heparin at 1000 units/hr. Plans noted for cath today. No anticoagulants noted PTA.   -heparin level at goal  Goal of Therapy:  Heparin level 0.3-0.7 units/ml Monitor platelets by anticoagulation protocol: Yes   Plan:  -Continue heparin at  1400 units/hr -Will follow plans post cath  Hildred Laser, PharmD Clinical Pharmacist **Pharmacist phone directory can now be found on [email or web address] ([email or web address]).  Listed under [place].

## 2021-06-22 NOTE — Discharge Summary (Addendum)
Discharge Summary    Patient ID: Paige Woodward MRN: [id number]; DOB: 1971-03-06  Admit date: 06/21/2021 Discharge date: 06/22/2021  PCP:  Norm Salt, PA   [facility] Providers Cardiologist:  Kristeen Miss, MD   {   Discharge Diagnoses    Principal Problem:   Non-STEMI (non-ST elevated myocardial infarction) [facility]) Active Problems:   NSTEMI (non-ST elevated myocardial infarction) (HCC)   Elevated troponin   HTN   AKI   Hx of CVA   Diagnostic Studies/Procedures    Echo 06/21/21  1. Left ventricular ejection fraction, by estimation, is 60 to 65%. The  left ventricle has normal function. The left ventricle has no regional  wall motion abnormalities. Left ventricular diastolic parameters are  consistent with Grade I diastolic  dysfunction (impaired relaxation).   2. Right ventricular systolic function is normal. The right ventricular  size is normal. There is normal pulmonary artery systolic pressure.   3. The mitral valve is normal in structure. Trivial mitral valve  regurgitation. No evidence of mitral stenosis.   4. The aortic valve is normal in structure. Aortic valve regurgitation is  not visualized. No aortic stenosis is present.   5. The inferior vena cava is normal in size with greater than 50%  respiratory variability, suggesting right atrial pressure of 3 mmHg.   LEFT HEART CATH AND CORONARY ANGIOGRAPHY  06/22/21   Conclusion  Normal epicardial coronary arteries.   Normal LVEDP at 10 mmHg.   RECOMMENDATION: Continued medical therapy for hypertension, hyperlipidemia, and antiplatelet therapy with history of prior stroke.   Diagnostic Dominance: Right     History of Present Illness     Paige Woodward is a 51 y.o. female with 51 y.o. female  with reported CAD, hypertension, hyperlipidemia, stroke/TIA, fibromyalgia, possible atrial fibrillation, chronic pain syndrome and ongoing tobacco smoking who is being seen for the evaluation of chest pain and  found to have elevated troponin.  Hospital Course     Consultants: None   Non-STEMI -High sensitivity troponin 72>>189>>2002>>2407.  Chest pain-free since got 2 sublingual nitroglycerin in ED.  Treated with IV heparin. Started aspirin 81 mg daily. On plavix for stroke. Hold home atenolol given soft blood pressure. Echo with preserved LVEF. Stared Low dose Crestor (reported not taking at home, she is intolerance to Lipitor). Given gentle hydration and held losartan with resolution of elevated creatinine. Cath next day showed normal coronaries. No explanation of elevated troponin. Patient felt good. No recurrent chest pain. She felt stable for discharge. No need ASA. She will discuss statin with PCP.     2.  Hypertension -Blood pressure soft on admit.  -Hold home atenolol, Losartan and amlodipine - She will resume Amlodipine. If blood pressure goes up she will add Atenolol first and then 1/2 dose of losartan. Advise to keep log of blood pressure. She will bring readings for review.    3. AKI -Serum creatinine 1.97 on admit. Resolved with hydration.    4. Chronic pain syndrome    5. Hx of stroke - Continue home Plavix  Did the patient have an acute coronary syndrome (MI, NSTEMI, STEMI, etc) this admission?:  No.   The elevated Troponin was due to the acute medical illness (demand ischemia).       The patient will be scheduled for a TOC follow up appointment in 14 days.  A message has been sent to the [facility] and Scheduling Pool at the office where the patient should be seen for follow up.  Discharge Vitals Blood pressure 123/82, pulse 74, temperature (!) 97.4 F (36.3 C), temperature source Oral, resp. rate 14, height 5\' 7"  (1.702 m), weight 92 kg, SpO2 96 %.  Filed Weights   06/21/21 0300 06/21/21 0616  Weight: 90.3 kg 92 kg    Labs & Radiologic Studies    CBC Recent Labs    06/21/21 0207 06/22/21 0614  WBC 15.5* 12.5*  NEUTROABS 12.7*  --   HGB 12.6 13.2  HCT 34.5*  36.4  MCV 88.9 89.4  PLT 406* Q000111Q*   Basic Metabolic Panel Recent Labs    06/21/21 0207 06/22/21 0614  NA 134* 137  K 3.9 4.3  CL 99 107  CO2 26 23  GLUCOSE 113* 139*  BUN 20 14  CREATININE 1.97* 1.09*  CALCIUM 10.0 9.1    High Sensitivity Troponin:   Recent Labs  Lab 06/21/21 0207 06/21/21 0341 06/21/21 1107 06/21/21 1307  TROPONINIHS 72* 189* 2,002* 2,407*    BNP Invalid input(s): POCBNP D-Dimer Recent Labs    06/21/21 0207  DDIMER 0.55*   Hemoglobin A1C Recent Labs    06/21/21 1107  HGBA1C 4.8   Fasting Lipid Panel Recent Labs    06/22/21 0614  CHOL 180  HDL 38*  LDLCALC 133*  TRIG 46  CHOLHDL 4.7   Thyroid Function Tests Recent Labs    06/21/21 1107  TSH 1.331   _____________  DG Chest 2 View  Result Date: 06/21/2021 CLINICAL DATA:  Chest pain EXAM: CHEST - 2 VIEW COMPARISON:  12/14/2019 FINDINGS: Cardiac and mediastinal contours are within normal limits. Mild dependent atelectasis. No other focal pulmonary opacity. No pleural effusion or pneumothorax. No acute osseous abnormality. IMPRESSION: No acute cardiopulmonary process. Electronically Signed   By: Merilyn Baba M.D.   On: 06/21/2021 01:52   CARDIAC CATHETERIZATION  Result Date: 06/22/2021 Normal epicardial coronary arteries. Normal LVEDP at 10 mmHg. RECOMMENDATION: Continued medical therapy for hypertension, hyperlipidemia, and antiplatelet therapy with history of prior stroke.   ECHOCARDIOGRAM COMPLETE  Result Date: 06/21/2021    ECHOCARDIOGRAM REPORT   Patient Name:   Paige Woodward Date of Exam: 06/21/2021 Medical Rec #:  [id number]     Height:       67.0 in Accession #:    [id number]    Weight:       202.8 lb Date of Birth:  10-Nov-1970     BSA:          2.034 m Patient Age:    51 years      BP:           109/76 mmHg Patient Gender: F             HR:           61 bpm. Exam Location:  Inpatient Procedure: 2D Echo Indications:    NSTEMI  History:        Patient has no prior history of  Echocardiogram examinations.                 Acute MI; Risk Factors:Hypertension.  Sonographer:    Arlyss Gandy Referring Phys: [id number] Crista Luria BHAGAT IMPRESSIONS  1. Left ventricular ejection fraction, by estimation, is 60 to 65%. The left ventricle has normal function. The left ventricle has no regional wall motion abnormalities. Left ventricular diastolic parameters are consistent with Grade I diastolic dysfunction (impaired relaxation).  2. Right ventricular systolic function is normal. The right ventricular size is normal. There is normal pulmonary artery systolic pressure.  3. The mitral valve is normal in structure. Trivial mitral valve regurgitation. No evidence of mitral stenosis.  4. The aortic valve is normal in structure. Aortic valve regurgitation is not visualized. No aortic stenosis is present.  5. The inferior vena cava is normal in size with greater than 50% respiratory variability, suggesting right atrial pressure of 3 mmHg. FINDINGS  Left Ventricle: Left ventricular ejection fraction, by estimation, is 60 to 65%. The left ventricle has normal function. The left ventricle has no regional wall motion abnormalities. The left ventricular internal cavity size was normal in size. There is  no left ventricular hypertrophy. Left ventricular diastolic parameters are consistent with Grade I diastolic dysfunction (impaired relaxation). Right Ventricle: The right ventricular size is normal. No increase in right ventricular wall thickness. Right ventricular systolic function is normal. There is normal pulmonary artery systolic pressure. The tricuspid regurgitant velocity is 2.06 m/s, and  with an assumed right atrial pressure of 3 mmHg, the estimated right ventricular systolic pressure is 0000000 mmHg. Left Atrium: Left atrial size was normal in size. Right Atrium: Right atrial size was normal in size. Pericardium: There is no evidence of pericardial effusion. Mitral Valve: The mitral valve is normal in  structure. Trivial mitral valve regurgitation. No evidence of mitral valve stenosis. Tricuspid Valve: The tricuspid valve is normal in structure. Tricuspid valve regurgitation is mild . No evidence of tricuspid stenosis. Aortic Valve: The aortic valve is normal in structure. Aortic valve regurgitation is not visualized. No aortic stenosis is present. Aortic valve mean gradient measures 5.0 mmHg. Aortic valve peak gradient measures 9.2 mmHg. Aortic valve area, by VTI measures 2.65 cm. Pulmonic Valve: The pulmonic valve was normal in structure. Pulmonic valve regurgitation is not visualized. No evidence of pulmonic stenosis. Aorta: The aortic root is normal in size and structure. Venous: The inferior vena cava is normal in size with greater than 50% respiratory variability, suggesting right atrial pressure of 3 mmHg. IAS/Shunts: No atrial level shunt detected by color flow Doppler.  LEFT VENTRICLE PLAX 2D LVIDd:         4.70 cm   Diastology LVIDs:         3.30 cm   LV e' medial:    10.00 cm/s LV PW:         0.80 cm   LV E/e' medial:  8.4 LV IVS:        0.80 cm   LV e' lateral:   10.30 cm/s LVOT diam:     2.00 cm   LV E/e' lateral: 8.2 LV SV:         92 LV SV Index:   45 LVOT Area:     3.14 cm  RIGHT VENTRICLE             IVC RV Basal diam:  3.90 cm     IVC diam: 1.80 cm RV Mid diam:    2.90 cm RV S prime:     14.10 cm/s TAPSE (M-mode): 3.0 cm LEFT ATRIUM             Index        RIGHT ATRIUM           Index LA diam:        3.30 cm 1.62 cm/m   RA Area:     14.40 cm LA Vol (A2C):   52.4 ml 25.76 ml/m  RA Volume:   34.70 ml  17.06 ml/m LA Vol (A4C):   47.6 ml 23.40 ml/m LA  Biplane Vol: 50.3 ml 24.73 ml/m  AORTIC VALVE AV Area (Vmax):    2.83 cm AV Area (Vmean):   2.74 cm AV Area (VTI):     2.65 cm AV Vmax:           152.00 cm/s AV Vmean:          103.000 cm/s AV VTI:            0.348 m AV Peak Grad:      9.2 mmHg AV Mean Grad:      5.0 mmHg LVOT Vmax:         137.00 cm/s LVOT Vmean:        89.700 cm/s LVOT  VTI:          0.293 m LVOT/AV VTI ratio: 0.84  AORTA Ao Root diam: 2.90 cm Ao Asc diam:  3.20 cm MITRAL VALVE               TRICUSPID VALVE MV Area (PHT): 3.16 cm    TR Peak grad:   17.0 mmHg MV Decel Time: 240 msec    TR Vmax:        206.00 cm/s MV E velocity: 84.00 cm/s MV A velocity: 98.50 cm/s  SHUNTS MV E/A ratio:  0.85        Systemic VTI:  0.29 m                            Systemic Diam: 2.00 cm Candee Furbish MD Electronically signed by Candee Furbish MD Signature Date/Time: 06/21/2021/4:33:08 PM    Final    Disposition   Pt is being discharged home today in good condition.  Follow-up Plans & Appointments     Follow-up Information     Loel Dubonnet, NP Follow up on 07/06/2021.   Specialty: Cardiology Why: @10 :55am for hospital follow up with Dr. Elmarie Shiley PA/NP. Please arrive 15 minutes early Contact information: [place] [address] [phone]                Discharge Instructions     Diet - low sodium heart healthy   Complete by: As directed    Discharge instructions   Complete by: As directed    No driving for 48 hours. No lifting over 5 lbs for 1 week. No sexual activity for 1 week. You may return to work on 06/28/21. Keep procedure site clean & dry. If you notice increased pain, swelling, bleeding or pus, call/return!  You may shower, but no soaking baths/hot tubs/pools for 1 week.   Only start for amlodipine. If Blood pressure goes up, start atenolol then losartan 1/2 dose  Some studies suggest Prilosec/Omeprazole interacts with Plavix. We changed your Prilosec/Omeprazole to Protonix for less chance of interaction.   Increase activity slowly   Complete by: As directed        Discharge Medications   Allergies as of 06/22/2021       Reactions   Ivp Dye [iodinated Contrast Media] Hives   Lipitor [atorvastatin]    Leg pain   Penicillins    Childhood allergy        Medication List     STOP taking these medications    atenolol 100  MG tablet Commonly known as: TENORMIN   losartan 50 MG tablet Commonly known as: COZAAR   omeprazole 20 MG capsule Commonly known as: PRILOSEC       TAKE these medications  acetaminophen 650 MG CR tablet Commonly known as: TYLENOL Take 1,300 mg by mouth every 8 (eight) hours as needed for pain.   Alka-Seltzer Heartburn 750 MG chewable tablet Generic drug: calcium carbonate Chew 1,500 mg by mouth daily as needed for heartburn.   amLODipine 10 MG tablet Commonly known as: NORVASC Take 10 mg by mouth at bedtime.   clopidogrel 75 MG tablet Commonly known as: PLAVIX Take 75 mg by mouth at bedtime.   hydrOXYzine 25 MG capsule Commonly known as: VISTARIL Take 25 mg by mouth 2 (two) times daily.   nortriptyline 25 MG capsule Commonly known as: PAMELOR Take 25 mg by mouth at bedtime.   oxyCODONE 15 MG immediate release tablet Commonly known as: ROXICODONE Take 15 mg by mouth 3 (three) times daily as needed for pain.   pantoprazole 40 MG tablet Commonly known as: PROTONIX Take 1 tablet (40 mg total) by mouth daily. Further refills by PCP What changed:  how much to take when to take this additional instructions           Outstanding Labs/Studies   None  Duration of Discharge Encounter   Greater than 30 minutes including physician time.  Mahalia Longest Rawson, PA 06/22/2021, 4:48 PM  Attending Note:   The patient was seen and examined.  Agree with assessment and plan as noted above.  Changes made to the above note as needed.  Patient seen and independently examined with Robbie Lis, PA .   We discussed all aspects of the encounter. I agree with the assessment and plan as stated above.    NSTEMI:  troponins peaked around 2400.   Cath was read out as normal coronaries.   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    She was continued on Plavix,  low dose metoprolol was started.   She was pain free.   Will follow up with [place] in  the next several weeks     2.  HTN:  well controlled at this point .   Cont current meds.    3.  AKI:  creatinine has improved.    I have spent a total of 40 minutes with patient reviewing hospital  notes , telemetry, EKGs, labs and examining patient as well as establishing an assessment and plan that was discussed with the patient.  > 50% of time was spent in direct patient care.    Thayer Headings, Brooke Bonito., MD, [facility] 07/01/2021, 9:27 AM [address]  [place] Pager [phone]

## 2021-06-22 NOTE — Plan of Care (Signed)
°  Problem: Education: Goal: Knowledge of General Education information will improve Description: Including pain rating scale, medication(s)/side effects and non-pharmacologic comfort measures 06/22/2021 1651 by Normajean Baxter, RN Outcome: Adequate for Discharge 06/22/2021 0744 by Normajean Baxter, RN Outcome: Progressing   Problem: Health Behavior/Discharge Planning: Goal: Ability to manage health-related needs will improve 06/22/2021 1651 by Normajean Baxter, RN Outcome: Adequate for Discharge 06/22/2021 0744 by Normajean Baxter, RN Outcome: Progressing   Problem: Clinical Measurements: Goal: Ability to maintain clinical measurements within normal limits will improve Outcome: Adequate for Discharge Goal: Will remain free from infection Outcome: Adequate for Discharge Goal: Diagnostic test results will improve Outcome: Adequate for Discharge Goal: Respiratory complications will improve Outcome: Adequate for Discharge Goal: Cardiovascular complication will be avoided Outcome: Adequate for Discharge   Problem: Activity: Goal: Risk for activity intolerance will decrease 06/22/2021 1651 by Normajean Baxter, RN Outcome: Adequate for Discharge 06/22/2021 [phone] by Normajean Baxter, RN Outcome: Progressing   Problem: Nutrition: Goal: Adequate nutrition will be maintained Outcome: Adequate for Discharge   Problem: Coping: Goal: Level of anxiety will decrease Outcome: Adequate for Discharge   Problem: Elimination: Goal: Will not experience complications related to bowel motility Outcome: Adequate for Discharge Goal: Will not experience complications related to urinary retention Outcome: Adequate for Discharge   Problem: Pain Managment: Goal: General experience of comfort will improve Outcome: Adequate for Discharge   Problem: Safety: Goal: Ability to remain free from injury will improve Outcome: Adequate for Discharge   Problem: Skin Integrity: Goal: Risk for impaired skin  integrity will decrease Outcome: Adequate for Discharge

## 2021-06-23 NOTE — Telephone Encounter (Signed)
Left message to call back  

## 2021-06-24 ENCOUNTER — Telehealth: Payer: Self-pay

## 2021-06-24 ENCOUNTER — Encounter: Payer: Self-pay | Admitting: Cardiovascular Disease

## 2021-06-24 MED ORDER — METOPROLOL SUCCINATE ER 25 MG PO TB24: 25.0000 mg | ORAL_TABLET | Freq: Every day | ORAL | 3 refills | Status: DC

## 2021-06-24 MED ORDER — NITROGLYCERIN 0.4 MG SL SUBL: 0.4000 mg | SUBLINGUAL_TABLET | SUBLINGUAL | 11 refills | Status: DC | PRN

## 2021-06-24 NOTE — Telephone Encounter (Signed)
Spoke with the patient and advised her on recommendations from Dr. Elease Hashimoto.  I have also spoken with Dr. Elease Hashimoto and he would additionally like to send in a prescription for nitroglycerin.  Both medications have been sent in.  She is already scheduled with Gillian Shields, NP on 2/21 for follow up.  Patient will keep this appointment.

## 2021-06-24 NOTE — Telephone Encounter (Signed)
-----   Message from Vesta Mixer, MD sent at 06/24/2021  3:23 PM EST ----- I have tried to reach [facility] . 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  She will need a follow up appt with me in the next month or so .   Thanks  PN

## 2021-06-28 NOTE — Telephone Encounter (Signed)
2nd call attempt, no answer, [id number] 

## 2021-07-06 ENCOUNTER — Ambulatory Visit (HOSPITAL_BASED_OUTPATIENT_CLINIC_OR_DEPARTMENT_OTHER): Payer: BC Managed Care – PPO | Admitting: Family

## 2021-07-06 NOTE — Progress Notes (Unsigned)
° °  Office Visit    Patient Name: Paige Woodward Date of Encounter: 07/06/2021  PCP:  Trey Sailors, [place]  Cardiologist:  Mertie Moores, MD  Advanced Practice Provider:  No care team member to display Electrophysiologist:  None      Chief Complaint    Paige Woodward is a 51 y.o. female with a hx of *** presents today for discussion of cardiac catheterization.    Past Medical History    Past Medical History:  Diagnosis Date   Fibromyalgia    GERD (gastroesophageal reflux disease)    Hypertension    MI (myocardial infarction) ([place])    Ulcerative colitis ([place])    Past Surgical History:  Procedure Laterality Date   ABDOMINAL HYSTERECTOMY     APPENDECTOMY     CARDIAC CATHETERIZATION     CHOLECYSTECTOMY     EYE SURGERY     LEFT HEART CATH AND CORONARY ANGIOGRAPHY N/A 06/22/2021   Procedure: LEFT HEART CATH AND CORONARY ANGIOGRAPHY;  Surgeon: Troy Sine, MD;  Location: [place];  Service: Cardiovascular;  Laterality: N/A;   SPLENECTOMY      Allergies  Allergies  Allergen Reactions   Ivp Dye [Iodinated Contrast Media] Hives   Lipitor [Atorvastatin]     Leg pain   Penicillins     Childhood allergy    History of Present Illness    Paige Woodward is a 51 y.o. female with a hx of *** last seen while hospitalized.   EKGs/Labs/Other Studies Reviewed:   The following studies were reviewed today: ***  EKG:  EKG is *** ordered today.  The ekg ordered today demonstrates ***  Recent Labs: 06/21/2021: B Natriuretic Peptide 61.9; TSH 1.331 06/22/2021: BUN 14; Creatinine, Ser 1.09; Hemoglobin 13.2; Platelets 415; Potassium 4.3; Sodium 137  Recent Lipid Panel    Component Value Date/Time   CHOL 180 06/22/2021 0614   TRIG 46 06/22/2021 0614   HDL 38 (L) 06/22/2021 0614   CHOLHDL 4.7 06/22/2021 0614   VLDL 9 06/22/2021 0614   LDLCALC 133 (H) 06/22/2021 0614    Risk Assessment/Calculations:  {Does this patient have  ATRIAL FIBRILLATION?:[phone]}  Home Medications   No outpatient medications have been marked as taking for the 07/06/21 encounter (Appointment) with Loel Dubonnet, NP.     Review of Systems   ***   All other systems reviewed and are otherwise negative except as noted above.  Physical Exam    VS:  There were no vitals taken for this visit. , BMI There is no height or weight on file to calculate BMI.  Wt Readings from Last 3 Encounters:  06/21/21 202 lb 13.2 oz (92 kg)  09/21/20 199 lb (90.3 kg)  12/07/19 192 lb (87.1 kg)     GEN: Well nourished, well developed, in no acute distress. HEENT: normal. Neck: Supple, no JVD, carotid bruits, or masses. Cardiac: ***RRR, no murmurs, rubs, or gallops. No clubbing, cyanosis, edema.  ***Radials/PT 2+ and equal bilaterally.  Respiratory:  ***Respirations regular and unlabored, clear to auscultation bilaterally. GI: Soft, nontender, nondistended. MS: No deformity or atrophy. Skin: Warm and dry, no rash. Neuro:  Strength and sensation are intact. Psych: Normal affect.  Assessment & Plan    ***      Disposition: Follow up {follow up:15908} with Mertie Moores, MD or APP.  Signed, Loel Dubonnet, NP 07/06/2021, 11:14 AM [place]

## 2021-07-12 ENCOUNTER — Encounter: Payer: Self-pay | Admitting: Cardiovascular Disease

## 2021-07-12 NOTE — Progress Notes (Signed)
Pt had to reschedule  - went to wrong office  This encounter was created in error - please disregard.

## 2021-07-13 ENCOUNTER — Encounter: Payer: BC Managed Care – PPO | Admitting: Cardiovascular Disease

## 2021-07-13 ENCOUNTER — Telehealth: Payer: Self-pay | Admitting: Cardiovascular Disease

## 2021-07-13 NOTE — Telephone Encounter (Signed)
Nahser, Paige Ping, MD  P Cv [facility] Triage; [facility] Alf It looks like [name] went to the wrong office today and missed her appt  Please schedule her for Friday, march 8  - my next DOD day   Thanks   PN   Spoke with the pt to reschedule her appt with Dr. Elease Hashimoto, as Dr. Elease Hashimoto requested above.  Pt states [place] will be out of the country all next week and will be back the following week. Was able to reschedule the pt to see Dr. Elease Hashimoto for 07/30/21 at 1140.  She is aware to arrive 15 mins prior to this appt.  She is aware that she should come to our [facility] location to see Dr. Elease Hashimoto that day.  Dr. Elease Hashimoto agreed to this reschedule day as well.   Pt verbalized understanding and agrees with this plan.

## 2021-07-13 NOTE — Telephone Encounter (Signed)
Patient was told wrong location ([facility]) they told her she still can make to [address] by 450 she could keep her appt. But traffic was bad patient is looking to get reschedule for this week. Please advise

## 2021-07-30 ENCOUNTER — Ambulatory Visit: Payer: BC Managed Care – PPO | Admitting: Cardiovascular Disease

## 2021-08-04 ENCOUNTER — Ambulatory Visit (HOSPITAL_BASED_OUTPATIENT_CLINIC_OR_DEPARTMENT_OTHER): Payer: BC Managed Care – PPO | Admitting: Family

## 2021-08-04 ENCOUNTER — Telehealth: Payer: Self-pay | Admitting: Licensed Clinical Social Worker

## 2021-08-04 NOTE — Progress Notes (Signed)
?Heart and Vascular Care Navigation ? ?08/04/2021 ? ?Paige Woodward ?12-29-1970 ?[id number] ? ?Reason for Referral:  ?Multiple cancelled appts.  ?Engaged with patient by telephone for initial visit for Heart and Vascular Care Coordination. ?                                                                                                  ?Assessment:         ?LCSW reached out to pt via telephone. Pt has cancelled many appointments, usually same day.  Was able to reach pt at [phone]. Introduced self, role, reason for call. Confirmed listed address, she shares that they physically live at [address] [place], [place], [place], [address]. Confirmed PCP and current emergency contacts. Pt shares that she has an insurance plan in which the copay is high for specialty visits. She is employed, but intermittantly wasn't able to work and "the job I do is that I only get paid if I work." LCSW shared several options for cost concerns which include medications and having money at the end of each month for things such as copays. Pt agreeable to coming to upcoming appt, encouraged to call me if any concerns.  ? ?I also broached pt interest in smoking cessation as it was noted by Dr. Elease Hashimoto that he wanted to address that with pt. Pt interested, and agreeable to Health Coach calling her to discuss potential coaching.                            ? ?HRT/VAS Care Coordination   ? ? Patients Home Cardiology Office --  [facility]  ? Outpatient Care Team Social Worker  ? Social Worker Name: Esmeralda Links [facility] [phone]  ? Living arrangements for the past 2 months Apartment  ? Lives with: Spouse  ? Patient Current Optometrist  ? Patient Has Concern With Paying Medical Bills Yes  ? Patient Concerns With Medical Bills copays  ? Medical Bill Referrals: does not meet criteria for assistance at this time  ? Does Patient Have Prescription Coverage? Yes  ? ?  ? ? ?Social History:                                                                              ?SDOH Screenings  ? ?Alcohol Screen: Not on file  ?Depression (PHQ2-9): Not on file  ?Financial Resource Strain: Medium Risk  ? Difficulty of Paying Living Expenses: Somewhat hard  ?Food Insecurity: No Food Insecurity  ? Worried About Programme researcher, broadcasting/film/video in the Last Year: Never true  ? Ran Out of Food in the Last Year: Never true  ?Housing: Low Risk   ? Last Housing Risk Score: 0  ?Physical Activity: Not on file  ?  Social Connections: Not on file  ?Stress: Not on file  ?Tobacco Use: High Risk  ? Smoking Tobacco Use: Every Day  ? Smokeless Tobacco Use: Never  ? Passive Exposure: Not on file  ?Transportation Needs: No Transportation Needs  ? Lack of Transportation (Medical): No  ? Lack of Transportation (Non-Medical): No  ? ? ?SDOH Interventions: ?Architect:   Housing clinic at courthouse ?  ?Food Insecurity:  Food Insecurity Interventions: Intervention Not Indicated  ?Housing Insecurity:  Housing Interventions: Intervention Not Indicated  ?Transportation:      ? ? ?Other Care Navigation Interventions:    ? ?Provided Pharmacy assistance resources  Mailed information about [facility] pharmacies  ?Patient expressed Mental Health concerns No.  ? ?Follow-up plan:   ?LCSW mailed pt list of [facility]patient Pharmacies in case her medications may be more affordable through them. LCSW also mailed Legal Aid housing flyer which includes information about housing assistance if needed. I also included my card for any questions/concerns.  ? ? ? ?

## 2021-08-10 ENCOUNTER — Ambulatory Visit (INDEPENDENT_AMBULATORY_CARE_PROVIDER_SITE_OTHER): Payer: BC Managed Care – PPO | Admitting: Family

## 2021-08-10 ENCOUNTER — Other Ambulatory Visit: Payer: Self-pay

## 2021-08-10 ENCOUNTER — Encounter (HOSPITAL_BASED_OUTPATIENT_CLINIC_OR_DEPARTMENT_OTHER): Payer: Self-pay

## 2021-08-10 ENCOUNTER — Encounter (HOSPITAL_BASED_OUTPATIENT_CLINIC_OR_DEPARTMENT_OTHER): Payer: Self-pay | Admitting: Family

## 2021-08-10 ENCOUNTER — Telehealth: Payer: Self-pay

## 2021-08-10 VITALS — BP 138/96 | HR 93 | Ht 67.0 in | Wt 211.9 lb

## 2021-08-10 DIAGNOSIS — Z720 Tobacco use: Secondary | ICD-10-CM

## 2021-08-10 DIAGNOSIS — Z0000 Encounter for general adult medical examination without abnormal findings: Secondary | ICD-10-CM

## 2021-08-10 DIAGNOSIS — Z8673 Personal history of transient ischemic attack (TIA), and cerebral infarction without residual deficits: Secondary | ICD-10-CM

## 2021-08-10 DIAGNOSIS — I10 Essential (primary) hypertension: Secondary | ICD-10-CM

## 2021-08-10 DIAGNOSIS — I25118 Atherosclerotic heart disease of native coronary artery with other forms of angina pectoris: Secondary | ICD-10-CM

## 2021-08-10 MED ORDER — ROSUVASTATIN CALCIUM 5 MG PO TABS: 5.0000 mg | ORAL_TABLET | Freq: Every day | ORAL | 1 refills | Status: DC

## 2021-08-10 MED ORDER — BUPROPION HCL ER (SR) 150 MG PO TB12: ORAL_TABLET | ORAL | 0 refills | Status: DC

## 2021-08-10 MED ORDER — AMLODIPINE BESYLATE 10 MG PO TABS: 5.0000 mg | ORAL_TABLET | Freq: Every day | ORAL | 1 refills | Status: DC

## 2021-08-10 NOTE — Progress Notes (Signed)
? ?Office Visit  ?  ?Patient Name: Paige Woodward ?Date of Encounter: 08/10/2021 ? ?PCP:  Trey Sailors, PA ?  ?[place]  ?Cardiologist:  Mertie Moores, MD  ?Advanced Practice Provider:  No care team member to display ?Electrophysiologist:  None  ?   ?Chief Complaint  ?  ?Paige Woodward is a 51 y.o. female with a hx of CAD, hypertension, CVA/TIA, fibromyalgia, possible atrial fibrillation, chronic pain syndrome, ongoing tobacco use presents today for follow-up after cardiac catheterization ? ?Past Medical History  ?  ?Past Medical History:  ?Diagnosis Date  ? Fibromyalgia   ? GERD (gastroesophageal reflux disease)   ? Hypertension   ? MI (myocardial infarction) ([place])   ? Ulcerative colitis ([place])   ? ?Past Surgical History:  ?Procedure Laterality Date  ? ABDOMINAL HYSTERECTOMY    ? APPENDECTOMY    ? CARDIAC CATHETERIZATION    ? CHOLECYSTECTOMY    ? EYE SURGERY    ? LEFT HEART CATH AND CORONARY ANGIOGRAPHY N/A 06/22/2021  ? Procedure: LEFT HEART CATH AND CORONARY ANGIOGRAPHY;  Surgeon: Troy Sine, MD;  Location: [facility];  Service: Cardiovascular;  Laterality: N/A;  ? SPLENECTOMY    ? ? ?Allergies ? ?Allergies  ?Allergen Reactions  ? Ivp Dye [Iodinated Contrast Media] Hives  ? Lipitor [Atorvastatin]   ?  Leg pain  ? Penicillins   ?  Childhood allergy  ? ? ?History of Present Illness  ?  ?Paige Woodward is a 51 y.o. female with a hx of CAD, hypertension, hyperlipidemia, possible atrial fibrillation, CVA/TIA, fibromyalgia, chronic pain syndrome last seen while hospitalized ? ?Prior stroke in 2018 with questionable atrial fibrillation at the time.  She was on Eliquis for a few months and then discontinued. ? ?She was admitted 06/2021 for NSTEMI with HS troponin 72 ? 189 ? 2002 ? 2407 though felt to be due to demand ischemia.  Echo with preserved LV function.  She was started on rosuvastatin.  Noted previous intolerance to Lipitor.  Atenolol, losartan, and amlodipine held due to  hypotension.  Noted with AKI during admission.  Cardiac catheterization revealed no significant coronary disease though on Dr. Elmarie Shiley review likely narrowing blockage of the very distal end of the vessel.  She was recommended to continue Plavix and start Toprol 25 mg daily. ? ?She presents today for follow-up. We reviewed her cardiac catheterization and hospitalization. Endorses "feeling okay "since discharge. She has had a couple of chest pains but nothing that worried her. They self resolved after a few minutes and happened both at rest and with activity. Reports no shortness of breath nor dyspnea on exertion.  No edema, orthopnea, PND. Reports no palpitations.  BP at home has been 138/96. No formal exercise routine. 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 She is very motivated to quit smoking and presently smoking 0.5 PPD.Was able to quit a few years ago but resumed due to stress.  ? ?EKGs/Labs/Other Studies Reviewed:  ? ?The following studies were reviewed today: ?Echo 06/21/21 ? 1. Left ventricular ejection fraction, by estimation, is 60 to 65%. The  ?left ventricle has normal function. The left ventricle has no regional  ?wall motion abnormalities. Left ventricular diastolic parameters are  ?consistent with Grade I diastolic  ?dysfunction (impaired relaxation).  ? 2. Right ventricular systolic function is normal. The right ventricular  ?size is normal. There is normal pulmonary artery systolic pressure.  ? 3. The mitral valve is normal in structure.  Trivial mitral valve  ?regurgitation. No evidence of mitral stenosis.  ? 4. The aortic valve is normal in structure. Aortic valve regurgitation is  ?not visualized. No aortic stenosis is present.  ? 5. The inferior vena cava is normal in size with greater than 50%  ?respiratory variability, suggesting right atrial pressure of 3 mmHg.  ?  ?LEFT HEART CATH AND CORONARY ANGIOGRAPHY  06/22/21  ?  ?Conclusion ?  ?Normal epicardial coronary  arteries. ?  ?Normal LVEDP at 10 mmHg. ?  ?RECOMMENDATION: ?Continued medical therapy for hypertension, hyperlipidemia, and antiplatelet therapy with history of prior stroke. ?  ?Diagnostic ?Dominance: Right ? ? ? ?EKG: No EKG today ? ?Recent Labs: ?06/21/2021: B Natriuretic Peptide 61.9; TSH 1.331 ?06/22/2021: BUN 14; Creatinine, Ser 1.09; Hemoglobin 13.2; Platelets 415; Potassium 4.3; Sodium 137  ?Recent Lipid Panel ?   ?Component Value Date/Time  ? CHOL 180 06/22/2021 0614  ? TRIG 46 06/22/2021 0614  ? HDL 38 (L) 06/22/2021 [id number]  ? CHOLHDL 4.7 06/22/2021 0614  ? VLDL 9 06/22/2021 0614  ? Kay 133 (H) 06/22/2021 [id number]  ? ? ? ?Home Medications  ? ?Current Meds  ?Medication Sig  ? acetaminophen (TYLENOL) 650 MG CR tablet Take 1,300 mg by mouth every 8 (eight) hours as needed for pain.  ? buPROPion (WELLBUTRIN SR) 150 MG 12 hr tablet Take one tablet daily for 3 days. Then take one tablet twice daily for 3 months for smoking cessation.  ? calcium carbonate (ALKA-SELTZER HEARTBURN) 750 MG chewable tablet Chew 1,500 mg by mouth daily as needed for heartburn.  ? clopidogrel (PLAVIX) 75 MG tablet Take 75 mg by mouth at bedtime.  ? hydrOXYzine (VISTARIL) 25 MG capsule Take 25 mg by mouth 2 (two) times daily.  ? metoprolol succinate (TOPROL XL) 25 MG 24 hr tablet Take 1 tablet (25 mg total) by mouth daily.  ? nitroGLYCERIN (NITROSTAT) 0.4 MG SL tablet Place 1 tablet (0.4 mg total) under the tongue every 5 (five) minutes as needed for chest pain.  ? nortriptyline (PAMELOR) 25 MG capsule Take 25 mg by mouth at bedtime.  ? oxyCODONE (ROXICODONE) 15 MG immediate release tablet Take 15 mg by mouth 3 (three) times daily as needed for pain.  ? pantoprazole (PROTONIX) 40 MG tablet Take 1 tablet (40 mg total) by mouth daily. Further refills by PCP  ?  ? ?Review of Systems  ?    ?All other systems reviewed and are otherwise negative except as noted above. ? ?Physical Exam  ?  ?VS:  BP (!) 138/96 (BP Location: Left Arm, Patient  Position: Sitting, Cuff Size: Large)   Pulse 93   Ht 5\' 7"  (1.702 m)   Wt 211 lb 14.4 oz (96.1 kg)   BMI 33.19 kg/m?  , BMI Body mass index is 33.19 kg/m?. ? ?Wt Readings from Last 3 Encounters:  ?08/10/21 211 lb 14.4 oz (96.1 kg)  ?06/21/21 202 lb 13.2 oz (92 kg)  ?09/21/20 199 lb (90.3 kg)  ?  ?GEN: Well nourished, well developed, in no acute distress. ?HEENT: normal. ?Neck: Supple, no JVD, carotid bruits, or masses. ?Cardiac: RRR, no murmurs, rubs, or gallops. No clubbing, cyanosis, edema.  Radials/PT 2+ and equal bilaterally.  ?Respiratory:  Respirations regular and unlabored, clear to auscultation bilaterally. ?GI: Soft, nontender, nondistended. ?MS: No deformity or atrophy. ?Skin: Warm and dry, no rash. ?Neuro:  Strength and sensation are intact. ?Psych: Normal affect. ? ?Assessment & Plan  ?  ?CAD - Cardiac cath 06/2021 with concern  for distal vessel stenosis by independent review of cath films by Dr. Acie Fredrickson. Plan for medical management. Continue Metoprolol 25mg  QD. Start Amlodipine 5mg  QD. Continue Plavix 75mg  QD due to hx of CVA. Resume Rosuvastatin 5mg  QD as detailed below. Heart healthy diet and regular cardiovascular exercise encouraged.   ? ?Hypertension - Losartan, Amlodipine held during recent admission due to hypotension, AKI. Due to microvascular anginal benefit and elevated BP, resume Amlodipine 5mg  QD. MyChart check in 2 weeks to reassess BP.  ? ?Hyperlipidemia - LDL goal <70. Prior myalgias on Lipitor. 06/22/21 total cholesterol 180, HDL 38, LDL 133, triglycerides 46. She was treated while admission with Rosuvastatin 5mg  QD but does not have Rx from discharge which we will provide today. If intolerant, consider referral to lipid clinic.  ? ?Tobacco abuse - Currently smoking 0.5 PPD. Previously was able to quit for 2-3 years but resumed due to stress. She is motivated to quit. Handout provided on coping with quiting smoking. Encouraged to use 1-800-QUITNOW. ?Rx Wellbutrin 150mg  QD x 3 days then  BID x 3 months for smoking cessation. To set quit date for 1 week after starting.  ?Referred to psychology Dr. Elias Else, PsyD for resources on coping with stress, smoking cessation.  ?Referred to health coach fo

## 2021-08-10 NOTE — Patient Instructions (Signed)
Medication Instructions:  ?Your physician has recommended you make the following change in your medication:   ? ?START Amlodipine half tablet ( 5mg  ) tablet daily ? ?START Wellbutrin to help with quitting smoking ?Take one tablet daily for three days ?Then take one tablet twice daily ?You will take for 3 months ?Please set quit date for 1 week after starting ? ?*If you need a refill on your cardiac medications before your next appointment, please call your pharmacy* ? ?Lab Work: ?None ordered today.  ? ?Testing/Procedures: ?Your EKG today shows normal sinus rhythm which is a good result.  ? ?Follow-Up: ?At [facility], you and your health needs are our priority.  As part of our continuing mission to provide you with exceptional heart care, we have created designated Provider Care Teams.  These Care Teams include your primary Cardiologist (physician) and Advanced Practice Providers (APPs -  Physician Assistants and Nurse Practitioners) who all work together to provide you with the care you need, when you need it. ? ?We recommend signing up for the patient portal called "MyChart".  Sign up information is provided on this After Visit Summary.  MyChart is used to connect with patients for Virtual Visits (Telemedicine).  Patients are able to view lab/test results, encounter notes, upcoming appointments, etc.  Non-urgent messages can be sent to your provider as well.   ?To learn more about what you can do with MyChart, go to [email or web address].   ? ?Your next appointment:   ?3 month(s) ? ?The format for your next appointment:   ?In Person ? ?Provider:   ?Mertie Moores, MD or Loel Dubonnet, NP   ? ? ?Other Instructions ? ?You have been referred to psychology today. Mental health is an important part of heart health. You have been referred to Dr. Elias Else. He is located at [facility] Primary care at [facility]. They will contact you to schedule an appointment. If you have not heard from them in 1  week, you may call [phone] to schedule an appointment.   ? ? ?Managing the Challenge of Quitting Smoking ?Quitting smoking is a physical and mental challenge. You will face cravings, withdrawal symptoms, and temptation. Before quitting, work with your health care provider to make a plan that can help you manage quitting. Preparation can help you quit and keep you from giving in. ?How to manage lifestyle changes ?Managing stress ?Stress can make you want to smoke, and wanting to smoke may cause stress. It is important to find ways to manage your stress. You might try some of the following: ?Practice relaxation techniques. ?Breathe slowly and deeply, in through your nose and out through your mouth. ?Listen to music. ?Soak in a bath or take a shower. ?Imagine a peaceful place or vacation. ?Get some support. ?Talk with family or friends about your stress. ?Join a support group. ?Talk with a counselor or therapist. ?Get some physical activity. ?Go for a walk, run, or bike ride. ?Play a favorite sport. ?Practice yoga. ? ?Medicines ?Talk with your health care provider about medicines that might help you deal with cravings and make quitting easier for you. ?Relationships ?Social situations can be difficult when you are quitting smoking. To manage this, you can: ?Avoid parties and other social situations where people might be smoking. ?Avoid alcohol. ?Leave right away if you have the urge to smoke. ?Explain to your family and friends that you are quitting smoking. Ask for support and let them know you might be a bit grumpy. ?  Plan activities where smoking is not an option. ?General instructions ?Be aware that many people gain weight after they quit smoking. However, not everyone does. To keep from gaining weight, have a plan in place before you quit and stick to the plan after you quit. Your plan should include: ?Having healthy snacks. When you have a craving, it may help to: ?Eat popcorn, carrots, celery, or other cut  vegetables. ?Chew sugar-free gum. ?Changing how you eat. ?Eat small portion sizes at meals. ?Eat 4-6 small meals throughout the day instead of 1-2 large meals a day. ?Be mindful when you eat. Do not watch television or do other things that might distract you as you eat. ?Exercising regularly. ?Make time to exercise each day. If you do not have time for a long workout, do short bouts of exercise for 5-10 minutes several times a day. ?Do some form of strengthening exercise, such as weight lifting. ?Do some exercise that gets your heart beating and causes you to breathe deeply, such as walking fast, running, swimming, or biking. This is very important. ?Drinking plenty of water or other low-calorie or no-calorie drinks. Drink 6-8 glasses of water daily. ? ?How to recognize withdrawal symptoms ?Your body and mind may experience discomfort as you try to get used to not having nicotine in your system. These effects are called withdrawal symptoms. They may include: ?Feeling hungrier than normal. ?Having trouble concentrating. ?Feeling irritable or restless. ?Having trouble sleeping. ?Feeling depressed. ?Craving a cigarette. ?To manage withdrawal symptoms: ?Avoid places, people, and activities that trigger your cravings. ?Remember why you want to quit. ?Get plenty of sleep. ?Avoid coffee and other caffeinated drinks. These may worsen some of your symptoms. ?These symptoms may surprise you. But be assured that they are normal to have when quitting smoking. ?How to manage cravings ?Come up with a plan for how to deal with your cravings. The plan should include the following: ?A definition of the specific situation you want to deal with. ?An alternative action you will take. ?A clear idea for how this action will help. ?The name of someone who might help you with this. ?Cravings usually last for 5-10 minutes. Consider taking the following actions to help you with your plan to deal with cravings: ?Keep your mouth busy. ?Chew  sugar-free gum. ?Suck on hard candies or a straw. ?Brush your teeth. ?Keep your hands and body busy. ?Change to a different activity right away. ?Squeeze or play with a ball. ?Do an activity or a hobby, such as making bead jewelry, practicing needlepoint, or working with wood. ?Mix up your normal routine. ?Take a short exercise break. Go for a quick walk or run up and down stairs. ?Focus on doing something kind or helpful for someone else. ?Call a friend or family member to talk during a craving. ?Join a support group. ?Contact a quitline. ?Where to find support ?To get help or find a support group: ?Call the National Cancer Institute's Smoking Quitline: 1-800-QUIT NOW 912 541 6739) ?Visit the website of the Substance Abuse and Mental Health Services Administration: SkateOasis.com.pt ?Text QUIT to SmokefreeTXT: 706237 ?Where to find more information ?Visit these websites to find more information on quitting smoking: ?National Cancer Institute: www.smokefree.gov ?American Lung Association: www.lung.org ?American Cancer Society: www.cancer.org ?Centers for Disease Control and Prevention: FootballExhibition.com.br ?American Heart Association: www.heart.org ?Contact a health care provider if: ?You want to change your plan for quitting. ?The medicines you are taking are not helping. ?Your eating feels out of control or you cannot sleep. ?Get  help right away if: ?You feel depressed or become very anxious. ?Summary ?Quitting smoking is a physical and mental challenge. You will face cravings, withdrawal symptoms, and temptation to smoke again. Preparation can help you as you go through these challenges. ?Try different techniques to manage stress, handle social situations, and prevent weight gain. ?You can deal with cravings by keeping your mouth busy (such as by chewing gum), keeping your hands and body busy, calling family or friends, or contacting a quitline for people who want to quit smoking. ?You can deal with withdrawal symptoms by  avoiding places where people smoke, getting plenty of rest, and avoiding drinks with caffeine. ?This information is not intended to replace advice given to you by your health care provider. Make sure you dis

## 2021-08-10 NOTE — Telephone Encounter (Signed)
Called patient per referral from Gillian Shields for smoking cessation. Patient is interested in smoking cessation would like to participate in health coaching. Patient has been scheduled for her initial health coaching session over the phone on 4/4 at 4:00pm. Patient will be called at this time. Patient has been emailed a copy of the [employer], Code of Ethics, and Quit4Life workbook for review.  ? ? ?Renaee Munda, CHWC ?[facility] ?Care Guide, Health Coach ?[address] ?[place] [address] ?Telephone: [phone] ?Email: [email or web address] ? ?

## 2021-08-17 ENCOUNTER — Ambulatory Visit: Payer: BC Managed Care – PPO

## 2021-08-17 ENCOUNTER — Telehealth: Payer: Self-pay

## 2021-08-17 DIAGNOSIS — Z0000 Encounter for general adult medical examination without abnormal findings: Secondary | ICD-10-CM

## 2021-08-17 NOTE — Telephone Encounter (Signed)
Called patient to hold initial health coaching session. Patient was still at work during this time. Rescheduled health coaching session during patient's lunch break on 4/5 at 11:00am. Will call patient at this time. ? ?Renaee Munda, CHWC ?[facility] ?Care Guide, Health Coach ?[address] ?[place] [address] ?Telephone: [phone] ?Email: [email or web address] ? ?

## 2021-08-18 ENCOUNTER — Telehealth: Payer: Self-pay

## 2021-08-18 ENCOUNTER — Ambulatory Visit: Payer: BC Managed Care – PPO

## 2021-08-18 DIAGNOSIS — Z0000 Encounter for general adult medical examination without abnormal findings: Secondary | ICD-10-CM

## 2021-08-18 NOTE — Telephone Encounter (Signed)
Called patient to hold initial health coaching session. Patient did not answer. Was not able to leave message because voicemail was full.  ? ?Avelino Leeds, [place] ?[facility] ?Care Guide, Health Coach ?[address] ?[place] [address] ?Telephone: [phone] ?Email: [email or web address] ? ?

## 2021-08-24 ENCOUNTER — Encounter (HOSPITAL_BASED_OUTPATIENT_CLINIC_OR_DEPARTMENT_OTHER): Payer: Self-pay

## 2021-09-16 ENCOUNTER — Other Ambulatory Visit: Payer: Self-pay | Admitting: Physician Assistant

## 2021-09-20 ENCOUNTER — Other Ambulatory Visit: Payer: Self-pay

## 2021-11-10 ENCOUNTER — Ambulatory Visit (HOSPITAL_BASED_OUTPATIENT_CLINIC_OR_DEPARTMENT_OTHER): Payer: BC Managed Care – PPO | Admitting: Family

## 2021-11-15 ENCOUNTER — Other Ambulatory Visit: Payer: Self-pay | Admitting: Physician Assistant

## 2022-03-03 ENCOUNTER — Inpatient Hospital Stay (HOSPITAL_COMMUNITY)
Admission: EM | Disposition: A | Discharge status: Home / Self Care | Payer: Self-pay | Source: Home / Self Care | Attending: Cardiology

## 2022-03-03 ENCOUNTER — Inpatient Hospital Stay (HOSPITAL_COMMUNITY)
Admission: EM | Admit: 2022-03-03 | Discharge: 2022-03-05 | DRG: 282 | Disposition: A | Discharge status: Home / Self Care | Payer: BC Managed Care – PPO | Attending: Cardiology | Admitting: Cardiology

## 2022-03-03 ENCOUNTER — Encounter (HOSPITAL_COMMUNITY): Payer: Self-pay

## 2022-03-03 ENCOUNTER — Emergency Department (HOSPITAL_COMMUNITY): Payer: BC Managed Care – PPO

## 2022-03-03 ENCOUNTER — Other Ambulatory Visit: Payer: Self-pay

## 2022-03-03 DIAGNOSIS — E876 Hypokalemia: Secondary | ICD-10-CM | POA: Diagnosis present

## 2022-03-03 DIAGNOSIS — G894 Chronic pain syndrome: Secondary | ICD-10-CM | POA: Diagnosis present

## 2022-03-03 DIAGNOSIS — Z888 Allergy status to other drugs, medicaments and biological substances status: Secondary | ICD-10-CM

## 2022-03-03 DIAGNOSIS — I25118 Atherosclerotic heart disease of native coronary artery with other forms of angina pectoris: Secondary | ICD-10-CM | POA: Diagnosis present

## 2022-03-03 DIAGNOSIS — I25111 Atherosclerotic heart disease of native coronary artery with angina pectoris with documented spasm: Secondary | ICD-10-CM | POA: Diagnosis present

## 2022-03-03 DIAGNOSIS — Z8673 Personal history of transient ischemic attack (TIA), and cerebral infarction without residual deficits: Secondary | ICD-10-CM | POA: Diagnosis not present

## 2022-03-03 DIAGNOSIS — I214 Non-ST elevation (NSTEMI) myocardial infarction: Principal | ICD-10-CM | POA: Diagnosis present

## 2022-03-03 DIAGNOSIS — R7989 Other specified abnormal findings of blood chemistry: Secondary | ICD-10-CM

## 2022-03-03 DIAGNOSIS — E785 Hyperlipidemia, unspecified: Secondary | ICD-10-CM | POA: Diagnosis present

## 2022-03-03 DIAGNOSIS — Z91041 Radiographic dye allergy status: Secondary | ICD-10-CM

## 2022-03-03 DIAGNOSIS — R079 Chest pain, unspecified: Secondary | ICD-10-CM

## 2022-03-03 DIAGNOSIS — I10 Essential (primary) hypertension: Secondary | ICD-10-CM

## 2022-03-03 DIAGNOSIS — R600 Localized edema: Secondary | ICD-10-CM | POA: Diagnosis present

## 2022-03-03 DIAGNOSIS — Z7902 Long term (current) use of antithrombotics/antiplatelets: Secondary | ICD-10-CM

## 2022-03-03 DIAGNOSIS — I252 Old myocardial infarction: Secondary | ICD-10-CM | POA: Diagnosis not present

## 2022-03-03 DIAGNOSIS — Z880 Allergy status to penicillin: Secondary | ICD-10-CM | POA: Diagnosis not present

## 2022-03-03 DIAGNOSIS — Z823 Family history of stroke: Secondary | ICD-10-CM

## 2022-03-03 DIAGNOSIS — Z79899 Other long term (current) drug therapy: Secondary | ICD-10-CM | POA: Diagnosis not present

## 2022-03-03 DIAGNOSIS — M797 Fibromyalgia: Secondary | ICD-10-CM | POA: Diagnosis present

## 2022-03-03 DIAGNOSIS — R609 Edema, unspecified: Secondary | ICD-10-CM | POA: Diagnosis not present

## 2022-03-03 DIAGNOSIS — K219 Gastro-esophageal reflux disease without esophagitis: Secondary | ICD-10-CM | POA: Diagnosis present

## 2022-03-03 HISTORY — PX: LEFT HEART CATH AND CORONARY ANGIOGRAPHY: CATH118249

## 2022-03-03 LAB — CBC WITH DIFFERENTIAL/PLATELET
Abs Immature Granulocytes: 0.09 10*3/uL — ABNORMAL HIGH (ref 0.00–0.07)
Basophils Absolute: 0 10*3/uL (ref 0.0–0.1)
Basophils Relative: 0 %
Eosinophils Absolute: 0 10*3/uL (ref 0.0–0.5)
Eosinophils Relative: 0 %
HCT: 34.4 % — ABNORMAL LOW (ref 36.0–46.0)
Hemoglobin: 12.2 g/dL (ref 12.0–15.0)
Immature Granulocytes: 1 %
Lymphocytes Relative: 11 %
Lymphs Abs: 1.7 10*3/uL (ref 0.7–4.0)
MCH: 30.7 pg (ref 26.0–34.0)
MCHC: 35.5 g/dL (ref 30.0–36.0)
MCV: 86.6 fL (ref 80.0–100.0)
Monocytes Absolute: 0.7 10*3/uL (ref 0.1–1.0)
Monocytes Relative: 4 %
Neutro Abs: 13.3 10*3/uL — ABNORMAL HIGH (ref 1.7–7.7)
Neutrophils Relative %: 84 %
Platelets: 516 10*3/uL — ABNORMAL HIGH (ref 150–400)
RBC: 3.97 MIL/uL (ref 3.87–5.11)
RDW: 14.2 % (ref 11.5–15.5)
WBC: 15.8 10*3/uL — ABNORMAL HIGH (ref 4.0–10.5)
nRBC: 0.1 % (ref 0.0–0.2)

## 2022-03-03 LAB — ECHOCARDIOGRAM COMPLETE
AR max vel: 2.44 cm2
AV Area VTI: 2.52 cm2
AV Area mean vel: 2.94 cm2
AV Mean grad: 3 mmHg
AV Peak grad: 6.7 mmHg
Ao pk vel: 1.29 m/s
Area-P 1/2: 3.91 cm2
Calc EF: 54.2 %
Height: 67 in
MV M vel: 3.18 m/s
MV Peak grad: 40.4 mmHg
S' Lateral: 3.3 cm
Single Plane A2C EF: 56.7 %
Single Plane A4C EF: 50 %
Weight: 3360 oz

## 2022-03-03 LAB — BASIC METABOLIC PANEL
Anion gap: 9 (ref 5–15)
BUN: 11 mg/dL (ref 6–20)
CO2: 26 mmol/L (ref 22–32)
Calcium: 9.9 mg/dL (ref 8.9–10.3)
Chloride: 103 mmol/L (ref 98–111)
Creatinine, Ser: 1.07 mg/dL — ABNORMAL HIGH (ref 0.44–1.00)
GFR, Estimated: >60 mL/min (ref >60)
Glucose, Bld: 140 mg/dL — ABNORMAL HIGH (ref 70–99)
Potassium: 4 mmol/L (ref 3.5–5.1)
Sodium: 138 mmol/L (ref 135–145)

## 2022-03-03 LAB — TROPONIN I (HIGH SENSITIVITY)
Troponin I (High Sensitivity): 3793 ng/L (ref <18)
Troponin I (High Sensitivity): 21343 ng/L (ref <18)

## 2022-03-03 LAB — D-DIMER, QUANTITATIVE: D-Dimer, Quant: 0.53 ug/mL-FEU — ABNORMAL HIGH (ref 0.00–0.50)

## 2022-03-03 LAB — SEDIMENTATION RATE: Sed Rate: 10 mm/hr (ref 0–22)

## 2022-03-03 LAB — C-REACTIVE PROTEIN: CRP: 0.8 mg/dL (ref <1.0)

## 2022-03-03 SURGERY — LEFT HEART CATH AND CORONARY ANGIOGRAPHY
Anesthesia: LOCAL

## 2022-03-03 MED ORDER — HYDROXYZINE HCL 25 MG PO TABS
50.0000 mg | ORAL_TABLET | Freq: Every day | ORAL | Status: DC
  Administered 2022-03-03 – 2022-03-04 (×2): 50 mg via ORAL
  Filled 2022-03-03 (×2): qty 2

## 2022-03-03 MED ORDER — LABETALOL HCL 5 MG/ML IV SOLN
10.0000 mg | INTRAVENOUS | Status: AC | PRN
  Administered 2022-03-03: 10 mg via INTRAVENOUS

## 2022-03-03 MED ORDER — CLOPIDOGREL BISULFATE 75 MG PO TABS
75.0000 mg | ORAL_TABLET | Freq: Every day | ORAL | Status: DC
  Administered 2022-03-03: 75 mg via ORAL
  Filled 2022-03-03: qty 1

## 2022-03-03 MED ORDER — SODIUM CHLORIDE 0.9 % IV SOLN: 250.0000 mL | INTRAVENOUS | Status: DC | PRN

## 2022-03-03 MED ORDER — NITROGLYCERIN 0.4 MG SL SUBL: 0.4000 mg | SUBLINGUAL_TABLET | SUBLINGUAL | Status: DC | PRN

## 2022-03-03 MED ORDER — METHYLPREDNISOLONE SODIUM SUCC 125 MG IJ SOLR
125.0000 mg | Freq: Once | INTRAMUSCULAR | Status: AC
  Administered 2022-03-03: 125 mg via INTRAVENOUS
  Filled 2022-03-03: qty 2

## 2022-03-03 MED ORDER — SODIUM CHLORIDE 0.9 % WEIGHT BASED INFUSION: 1.0000 mL/kg/h | INTRAVENOUS | Status: DC

## 2022-03-03 MED ORDER — NORTRIPTYLINE HCL 25 MG PO CAPS
50.0000 mg | ORAL_CAPSULE | Freq: Every day | ORAL | Status: DC
  Administered 2022-03-03 – 2022-03-04 (×2): 50 mg via ORAL
  Filled 2022-03-03 (×3): qty 2

## 2022-03-03 MED ORDER — ACETAMINOPHEN 325 MG PO TABS
650.0000 mg | ORAL_TABLET | ORAL | Status: DC | PRN
  Administered 2022-03-04: 650 mg via ORAL

## 2022-03-03 MED ORDER — FENTANYL CITRATE (PF) 100 MCG/2ML IJ SOLN
INTRAMUSCULAR | Status: DC | PRN
  Administered 2022-03-03 (×3): 25 ug via INTRAVENOUS

## 2022-03-03 MED ORDER — NITROGLYCERIN 1 MG/10 ML FOR IR/CATH LAB
INTRA_ARTERIAL | Status: AC
  Filled 2022-03-03: qty 10

## 2022-03-03 MED ORDER — ROSUVASTATIN CALCIUM 5 MG PO TABS
5.0000 mg | ORAL_TABLET | Freq: Every day | ORAL | Status: DC
  Administered 2022-03-03 – 2022-03-05 (×3): 5 mg via ORAL
  Filled 2022-03-03 (×3): qty 1

## 2022-03-03 MED ORDER — IOHEXOL 350 MG/ML SOLN
INTRAVENOUS | Status: DC | PRN
  Administered 2022-03-03: 40 mL

## 2022-03-03 MED ORDER — HYDRALAZINE HCL 20 MG/ML IJ SOLN
INTRAMUSCULAR | Status: AC
  Filled 2022-03-03: qty 1

## 2022-03-03 MED ORDER — SODIUM CHLORIDE 0.9% FLUSH
3.0000 mL | Freq: Two times a day (BID) | INTRAVENOUS | Status: DC
  Administered 2022-03-03 – 2022-03-05 (×4): 3 mL via INTRAVENOUS

## 2022-03-03 MED ORDER — HYDRALAZINE HCL 20 MG/ML IJ SOLN
10.0000 mg | INTRAMUSCULAR | Status: AC | PRN
  Administered 2022-03-03: 10 mg via INTRAVENOUS

## 2022-03-03 MED ORDER — SODIUM CHLORIDE 0.9% FLUSH: 3.0000 mL | INTRAVENOUS | Status: DC | PRN

## 2022-03-03 MED ORDER — VERAPAMIL HCL 2.5 MG/ML IV SOLN
INTRAVENOUS | Status: AC
  Filled 2022-03-03: qty 2

## 2022-03-03 MED ORDER — DIPHENHYDRAMINE HCL 50 MG/ML IJ SOLN
25.0000 mg | Freq: Once | INTRAMUSCULAR | Status: AC
  Administered 2022-03-03: 25 mg via INTRAVENOUS
  Filled 2022-03-03: qty 1

## 2022-03-03 MED ORDER — SODIUM CHLORIDE 0.9 % WEIGHT BASED INFUSION: 3.0000 mL/kg/h | INTRAVENOUS | Status: AC

## 2022-03-03 MED ORDER — HEPARIN (PORCINE) IN NACL 1000-0.9 UT/500ML-% IV SOLN
INTRAVENOUS | Status: AC
  Filled 2022-03-03: qty 1000

## 2022-03-03 MED ORDER — AMLODIPINE BESYLATE 5 MG PO TABS
5.0000 mg | ORAL_TABLET | Freq: Every day | ORAL | Status: DC
  Administered 2022-03-03 – 2022-03-05 (×3): 5 mg via ORAL
  Filled 2022-03-03 (×3): qty 1

## 2022-03-03 MED ORDER — MORPHINE SULFATE (PF) 2 MG/ML IV SOLN: 2.0000 mg | INTRAVENOUS | Status: DC | PRN

## 2022-03-03 MED ORDER — LIDOCAINE HCL (PF) 1 % IJ SOLN
INTRAMUSCULAR | Status: AC
  Filled 2022-03-03: qty 30

## 2022-03-03 MED ORDER — ASPIRIN 325 MG PO TABS
325.0000 mg | ORAL_TABLET | Freq: Every day | ORAL | Status: DC
  Administered 2022-03-03 – 2022-03-04 (×2): 325 mg via ORAL
  Filled 2022-03-03 (×2): qty 1

## 2022-03-03 MED ORDER — MIDAZOLAM HCL 2 MG/2ML IJ SOLN
INTRAMUSCULAR | Status: AC
  Filled 2022-03-03: qty 2

## 2022-03-03 MED ORDER — HEPARIN BOLUS VIA INFUSION
4000.0000 [IU] | Freq: Once | INTRAVENOUS | Status: AC
  Administered 2022-03-03: 4000 [IU] via INTRAVENOUS
  Filled 2022-03-03: qty 4000

## 2022-03-03 MED ORDER — ONDANSETRON HCL 4 MG/2ML IJ SOLN: 4.0000 mg | Freq: Four times a day (QID) | INTRAMUSCULAR | Status: DC | PRN

## 2022-03-03 MED ORDER — OXYCODONE HCL 5 MG PO TABS
15.0000 mg | ORAL_TABLET | Freq: Three times a day (TID) | ORAL | Status: DC | PRN
  Administered 2022-03-03 – 2022-03-05 (×5): 15 mg via ORAL
  Filled 2022-03-03 (×4): qty 3

## 2022-03-03 MED ORDER — SODIUM CHLORIDE 0.9 % WEIGHT BASED INFUSION: 3.0000 mL/kg/h | INTRAVENOUS | Status: DC

## 2022-03-03 MED ORDER — OXYCODONE HCL 5 MG PO TABS
ORAL_TABLET | ORAL | Status: AC
  Filled 2022-03-03: qty 3

## 2022-03-03 MED ORDER — METOPROLOL SUCCINATE ER 25 MG PO TB24
25.0000 mg | ORAL_TABLET | Freq: Every day | ORAL | Status: DC
  Administered 2022-03-03 – 2022-03-04 (×2): 25 mg via ORAL
  Filled 2022-03-03 (×2): qty 1

## 2022-03-03 MED ORDER — LABETALOL HCL 5 MG/ML IV SOLN
INTRAVENOUS | Status: AC
  Filled 2022-03-03: qty 4

## 2022-03-03 MED ORDER — ACETAMINOPHEN 325 MG PO TABS
650.0000 mg | ORAL_TABLET | ORAL | Status: DC | PRN
  Filled 2022-03-03: qty 2

## 2022-03-03 MED ORDER — PERFLUTREN LIPID MICROSPHERE
1.0000 mL | INTRAVENOUS | Status: AC | PRN
  Administered 2022-03-03: 2 mL via INTRAVENOUS

## 2022-03-03 MED ORDER — SODIUM CHLORIDE 0.9 % IV SOLN: INTRAVENOUS | Status: AC

## 2022-03-03 MED ORDER — PANTOPRAZOLE SODIUM 40 MG PO TBEC: 40.0000 mg | DELAYED_RELEASE_TABLET | Freq: Every day | ORAL | Status: DC | PRN

## 2022-03-03 MED ORDER — HEPARIN (PORCINE) IN NACL 1000-0.9 UT/500ML-% IV SOLN
INTRAVENOUS | Status: DC | PRN
  Administered 2022-03-03 (×2): 500 mL

## 2022-03-03 MED ORDER — MIDAZOLAM HCL 2 MG/2ML IJ SOLN
INTRAMUSCULAR | Status: DC | PRN
  Administered 2022-03-03 (×3): 1 mg via INTRAVENOUS

## 2022-03-03 MED ORDER — FENTANYL CITRATE (PF) 100 MCG/2ML IJ SOLN
INTRAMUSCULAR | Status: AC
  Filled 2022-03-03: qty 2

## 2022-03-03 MED ORDER — LIDOCAINE HCL (PF) 1 % IJ SOLN
INTRAMUSCULAR | Status: DC | PRN
  Administered 2022-03-03: 30 mL
  Administered 2022-03-03: 2 mL

## 2022-03-03 MED ORDER — HEPARIN (PORCINE) 25000 UT/250ML-% IV SOLN
1000.0000 [IU]/h | INTRAVENOUS | Status: DC
  Administered 2022-03-03: 1000 [IU]/h via INTRAVENOUS
  Filled 2022-03-03: qty 250

## 2022-03-03 SURGICAL SUPPLY — 12 items
CATH INFINITI 5FR MULTPACK ANG (CATHETERS) IMPLANT
GLIDESHEATH SLEND A-KIT 6F 22G (SHEATH) IMPLANT
GUIDEWIRE INQWIRE 1.5J.035X260 (WIRE) IMPLANT
INQWIRE 1.5J .035X260CM (WIRE) ×1
KIT HEART LEFT (KITS) ×1 IMPLANT
PACK CARDIAC CATHETERIZATION (CUSTOM PROCEDURE TRAY) ×1 IMPLANT
SHEATH PINNACLE 5F 10CM (SHEATH) IMPLANT
SHEATH PROBE COVER 6X72 (BAG) IMPLANT
TRANSDUCER W/STOPCOCK (MISCELLANEOUS) ×1 IMPLANT
TUBING CIL FLEX 10 FLL-RA (TUBING) ×1 IMPLANT
WIRE EMERALD 3MM-J .035X150CM (WIRE) IMPLANT
WIRE HI TORQ VERSACORE-J 145CM (WIRE) IMPLANT

## 2022-03-03 NOTE — ED Triage Notes (Signed)
Reports substernal chest pains beginning today around 1am while laying in bed. Became cold and clammy. Took 2 nitro SL with no improvement.

## 2022-03-03 NOTE — Progress Notes (Signed)
  Echocardiogram 2D Echocardiogram has been performed.  Paige Woodward 03/03/2022, 10:47 AM

## 2022-03-03 NOTE — H&P (Signed)
Cardiology Admission History and Physical   Patient ID: Paige Woodward MRN: [id number]; DOB: 1970/12/06   Admission date: 03/03/2022  PCP:  Trey Sailors, [place] Providers Cardiologist:  Mertie Moores, MD        Chief Complaint: Chest pain  Patient Profile:   Paige Woodward is a 51 y.o. female with history of CAD, hypertension, hyperlipidemia, stroke/TIA, fibromyalgia/chronic pain syndrome, possible atrial fibrillation (CVA in 2018 with questionable atrial fibrillation at that time, on Eliquis for few months then discontinued) who is being seen 03/03/2022 for the evaluation of chest pain.  History of Present Illness:   Paige Woodward presented to [facility] emergency department this morning following an episode of chest pain that began last night/early this morning around midnight.  Patient states that she had just finished eating some dessert when she had sudden onset of crushing chest pain, rated 10/10.  Patient took a sublingual nitroglycerin but did not have noticeable relief of her symptoms.  She then took a second nitroglycerin around 5 minutes later and had a little bit of relief of her symptoms.  Patient says that she went to lay down hoping that her pain would go away.  While pain did slowly improve, patient says that she developed some shortness of breath with diaphoresis and nausea.  Given progression of symptoms she decided that she needed to be evaluated this morning.  Patient reports no other episodes of chest pain since her hospitalization this past February.  She denies changes to exertional tolerance or recent illness.  This morning on my exam in the emergency department patient says that her pain has now improved to 2/10.  She does not appear in any acute distress at this time.  Patient denies recent surgeries, travel.  Endorses chronic bilateral lower extremity edema that has not changed in severity in recent weeks.  Denies lower extremity pain beyond that  which is baseline with her fibromyalgia.  Patient says that the severity of her symptoms overnight were more severe than this past February.  It sounds like patient has now had 4 episodes of severe chest pain over the last 15 years without clear determination of etiology.  Patient says that last night's episode reminded her of the very first time she had chest pain when she was around 51 years old.   Cardiac history:  Patient reports approximately 4 episodes of severe chest pain over the past 15 years without clear cause.  She says that the first of these episodes occurred when she was 36.  Evaluation took place at [facility] and per patient she had a negative left heart catheterization.  Most recently patient was admitted this past February with symptoms of chest pain and elevated troponin.  Transthoracic echocardiogram was without reduced ejection fraction or acute wall motion abnormalities and left heart catheterization was negative for significant coronary artery disease.  Patient was most recently seen in follow-up by Laurann Montana nurse practitioner on March 28.  At this follow-up appointment she was continued on metoprolol 25 mg once daily.  She was also started on amlodipine 5 mg once daily per notes. However, per patient's understanding of this visit, she has not been taking.  Past Medical History:  Diagnosis Date   Fibromyalgia    GERD (gastroesophageal reflux disease)    Hypertension    MI (myocardial infarction) ([place])    Ulcerative colitis ([place])     Past Surgical History:  Procedure Laterality Date   ABDOMINAL  HYSTERECTOMY     APPENDECTOMY     CARDIAC CATHETERIZATION     CHOLECYSTECTOMY     EYE SURGERY     LEFT HEART CATH AND CORONARY ANGIOGRAPHY N/A 06/22/2021   Procedure: LEFT HEART CATH AND CORONARY ANGIOGRAPHY;  Surgeon: Troy Sine, MD;  Location: [facility];  Service: Cardiovascular;  Laterality: N/A;   SPLENECTOMY       Medications  Prior to Admission: Prior to Admission medications   Medication Sig Start Date End Date Taking? Authorizing Provider  acetaminophen (TYLENOL) 500 MG tablet Take 1,000 mg by mouth daily as needed for headache.   Yes [provider]  acetaminophen (TYLENOL) 650 MG CR tablet Take 1,300 mg by mouth every 8 (eight) hours as needed for pain.   Yes [provider]  calcium carbonate (ALKA-SELTZER HEARTBURN) 750 MG chewable tablet Chew 1,500 mg by mouth daily as needed for heartburn.   Yes [provider]  clopidogrel (PLAVIX) 75 MG tablet Take 75 mg by mouth at bedtime. 06/14/21  Yes [provider]  hydrOXYzine (VISTARIL) 25 MG capsule Take 50 mg by mouth at bedtime. 06/17/21  Yes [provider]  metoprolol succinate (TOPROL XL) 25 MG 24 hr tablet Take 1 tablet (25 mg total) by mouth daily. Patient taking differently: Take 25 mg by mouth at bedtime. 06/24/21  Yes Nahser, Wonda Cheng, MD  nitroGLYCERIN (NITROSTAT) 0.4 MG SL tablet Place 1 tablet (0.4 mg total) under the tongue every 5 (five) minutes as needed for chest pain. 06/24/21  Yes Nahser, Wonda Cheng, MD  nortriptyline (PAMELOR) 25 MG capsule Take 50 mg by mouth at bedtime. 06/16/21  Yes [provider]  oxyCODONE (ROXICODONE) 15 MG immediate release tablet Take 15 mg by mouth 3 (three) times daily as needed for pain.   Yes [provider]  pantoprazole (PROTONIX) 40 MG tablet TAKE 1 TABLET BY MOUTH DAILY(FURTHER REFILLS BY PCP) Patient taking differently: Take 40 mg by mouth daily as needed (heartburn). 09/20/21  Yes Bhagat, Bhavinkumar, PA  rosuvastatin (CRESTOR) 5 MG tablet Take 1 tablet (5 mg total) by mouth daily. 08/10/21 04/23/22 Yes Loel Dubonnet, NP     Allergies:    Allergies  Allergen Reactions   Ivp Dye [Iodinated Contrast Media] Hives   Lipitor [Atorvastatin] Other (See Comments)    Myalgias   Penicillins Other (See Comments)    Childhood allergy Unknown reaction    Social  History:   Social History   Socioeconomic History   Marital status: Married    Spouse name: Not on file   Number of children: Not on file   Years of education: Not on file   Highest education level: Not on file  Occupational History   Not on file  Tobacco Use   Smoking status: Every Day    Types: Cigarettes   Smokeless tobacco: Never  Vaping Use   Vaping Use: Never used  Substance and Sexual Activity   Alcohol use: No   Drug use: No   Sexual activity: Not on file  Other Topics Concern   Not on file  Social History Narrative   Not on file   Social Determinants of Health   Financial Resource Strain: Medium Risk (08/04/2021)   Overall Financial Resource Strain (CARDIA)    Difficulty of Paying Living Expenses: Somewhat hard  Food Insecurity: No Food Insecurity (08/04/2021)   Hunger Vital Sign    Worried About Running Out of Food in the Last Year: Never true  Ran Out of Food in the Last Year: Never true  Transportation Needs: No Transportation Needs (08/04/2021)   PRAPARE - Hydrologist (Medical): No    Lack of Transportation (Non-Medical): No  Physical Activity: Not on file  Stress: Not on file  Social Connections: Not on file  Intimate Partner Violence: Not on file    Family History:   The patient's family history includes Stroke in her father.    ROS:  Please see the history of present illness.  All other ROS reviewed and negative.     Physical Exam/Data:   Vitals:   03/03/22 0612 03/03/22 0618 03/03/22 0945 03/03/22 1000  BP: 138/85  134/82 (!) 146/127  Pulse: (!) 57  (!) 59 64  Resp: 17  18 (!) 24  Temp: (!) 97.4 F (36.3 C)     TempSrc: Oral     SpO2: 97%  99% 98%  Weight:  95.3 kg    Height:  _0  (1.702 m)     No intake or output data in the 24 hours ending 03/03/22 1034    03/03/2022    6:18 AM 08/10/2021   10:00 AM 06/21/2021    6:16 AM  Last 3 Weights  Weight (lbs) 210 lb 211 lb 14.4 oz 202 lb 13.2 oz  Weight (kg)  95.255 kg 96.117 kg 92 kg     Body mass index is 32.89 kg/m.  General:  Well nourished, well developed, in no acute distress HEENT: normal Neck: no JVD Vascular: No carotid bruits; Distal pulses 2+ bilaterally   Cardiac:  normal S1, S2; RRR; no murmur  Lungs:  clear to auscultation bilaterally, no wheezing, rhonchi or rales  Abd: soft, nontender, no hepatomegaly  Ext: trace bilateral lower extremity edema Musculoskeletal:  No deformities, BUE and BLE strength normal and equal Skin: warm and dry  Neuro:  CNs 2-12 intact, no focal abnormalities noted Psych:  Normal affect    EKG:  The ECG that was done 10/19 was personally reviewed and demonstrates sinus rhythm with T wave inversions in III and aVF.  These T wave inversions are consistent with ECGs obtained during patient's admission in February 2023.  Relevant CV Studies:  06/21/2021 TTE  IMPRESSIONS     1. Left ventricular ejection fraction, by estimation, is 60 to 65%. The  left ventricle has normal function. The left ventricle has no regional  wall motion abnormalities. Left ventricular diastolic parameters are  consistent with Grade I diastolic  dysfunction (impaired relaxation).   2. Right ventricular systolic function is normal. The right ventricular  size is normal. There is normal pulmonary artery systolic pressure.   3. The mitral valve is normal in structure. Trivial mitral valve  regurgitation. No evidence of mitral stenosis.   4. The aortic valve is normal in structure. Aortic valve regurgitation is  not visualized. No aortic stenosis is present.   5. The inferior vena cava is normal in size with greater than 50%  respiratory variability, suggesting right atrial pressure of 3 mmHg.   FINDINGS   Left Ventricle: Left ventricular ejection fraction, by estimation, is 60  to 65%. The left ventricle has normal function. The left ventricle has no  regional wall motion abnormalities. The left ventricular internal cavity   size was normal in size. There is   no left ventricular hypertrophy. Left ventricular diastolic parameters  are consistent with Grade I diastolic dysfunction (impaired relaxation).   Right Ventricle: The right ventricular size is  normal. No increase in  right ventricular wall thickness. Right ventricular systolic function is  normal. There is normal pulmonary artery systolic pressure. The tricuspid  regurgitant velocity is 2.06 m/s, and   with an assumed right atrial pressure of 3 mmHg, the estimated right  ventricular systolic pressure is 85.6 mmHg.   Left Atrium: Left atrial size was normal in size.   Right Atrium: Right atrial size was normal in size.   Pericardium: There is no evidence of pericardial effusion.   Mitral Valve: The mitral valve is normal in structure. Trivial mitral  valve regurgitation. No evidence of mitral valve stenosis.   Tricuspid Valve: The tricuspid valve is normal in structure. Tricuspid  valve regurgitation is mild . No evidence of tricuspid stenosis.   Aortic Valve: The aortic valve is normal in structure. Aortic valve  regurgitation is not visualized. No aortic stenosis is present. Aortic  valve mean gradient measures 5.0 mmHg. Aortic valve peak gradient measures  9.2 mmHg. Aortic valve area, by VTI  measures 2.65 cm.   Pulmonic Valve: The pulmonic valve was normal in structure. Pulmonic valve  regurgitation is not visualized. No evidence of pulmonic stenosis.   Aorta: The aortic root is normal in size and structure.   Venous: The inferior vena cava is normal in size with greater than 50%  respiratory variability, suggesting right atrial pressure of 3 mmHg.   IAS/Shunts: No atrial level shunt detected by color flow Doppler.    06/22/2021 LHC  Normal epicardial coronary arteries.   Normal LVEDP at 10 mmHg.  Diagnostic Dominance: Right    Laboratory Data:  High Sensitivity Troponin:   Recent Labs  Lab 03/03/22 0645  TROPONINIHS  3,793*      Chemistry Recent Labs  Lab 03/03/22 0645  NA 138  K 4.0  CL 103  CO2 26  GLUCOSE 140*  BUN 11  CREATININE 1.07*  CALCIUM 9.9  GFRNONAA >60  ANIONGAP 9    No results for input(s): "PROT", "ALBUMIN", "AST", "ALT", "ALKPHOS", "BILITOT" in the last 168 hours. Lipids No results for input(s): "CHOL", "TRIG", "HDL", "LABVLDL", "LDLCALC", "CHOLHDL" in the last 168 hours. Hematology Recent Labs  Lab 03/03/22 0645  WBC 15.8*  RBC 3.97  HGB 12.2  HCT 34.4*  MCV 86.6  MCH 30.7  MCHC 35.5  RDW 14.2  PLT 516*   Thyroid No results for input(s): "TSH", "FREET4" in the last 168 hours. BNPNo results for input(s): "BNP", "PROBNP" in the last 168 hours.  DDimer No results for input(s): "DDIMER" in the last 168 hours.   Radiology/Studies:  DG Chest 2 View  Result Date: 03/03/2022 CLINICAL DATA:  Chest pain. EXAM: CHEST - 2 VIEW COMPARISON:  06/21/2021 FINDINGS: The lungs are clear without focal pneumonia, edema, pneumothorax or pleural effusion. Bibasilar atelectasis noted. The cardiopericardial silhouette is within normal limits for size. The visualized bony structures of the thorax are unremarkable. IMPRESSION: Bibasilar atelectasis.  Otherwise no acute cardiopulmonary findings. Electronically Signed   By: Misty Stanley M.D.   On: 03/03/2022 06:57     Assessment and Plan:   Chest pain with elevated troponin  Patient with acute onset of chest pain overnight while at rest.  Pain initially 10 out of 10, mild to moderate improvement with nitroglycerin and now rated at 2 out of 10 several hours after onset.  ECG this admission consistent with historical tracings.  T wave inversion in leads III and aVF.  Given patient's recent left heart catheterization in February of this  year which was negative for significant CAD, would be atypical for patient to have had a significant progression of disease.  However with elevated troponin to 3793, it is reasonable to reevaluate coronary  arteries with left heart catheterization.  Admit to cardiology, rounding team A Transthoracic echocardiogram today Plan for left heart catheterization today.  Premedicate with Solu-Medrol, Benadryl (history of hives with iodinated contrast).  Continue heparin until ACS ruled out. Will check ESR and CRP, possible myocarditis?.  Plan cardiac MRI if cath unremarkable D-dimer ordered  Risks and benefits of cardiac catheterization have been discussed with the patient.  These include bleeding, infection, kidney damage, stroke, heart attack, death.  The patient understands these risks and is willing to proceed.    Risk Assessment/Risk Scores:    TIMI Risk Score for Unstable Angina or Non-ST Elevation MI:   The patient's TIMI risk score is 2, which indicates a 8% risk of all cause mortality, new or recurrent myocardial infarction or need for urgent revascularization in the next 14 days.       Severity of Illness: The appropriate patient status for this patient is INPATIENT. Inpatient status is judged to be reasonable and necessary in order to provide the required intensity of service to ensure the patient's safety. The patient's presenting symptoms, physical exam findings, and initial radiographic and laboratory data in the context of their chronic comorbidities is felt to place them at high risk for further clinical deterioration. Furthermore, it is not anticipated that the patient will be medically stable for discharge from the hospital within 2 midnights of admission.   * I certify that at the point of admission it is my clinical judgment that the patient will require inpatient hospital care spanning beyond 2 midnights from the point of admission due to high intensity of service, high risk for further deterioration and high frequency of surveillance required.*   For questions or updates, please contact [place] Please consult [email or web address] for contact info under     Signed, Lily Kocher, PA-C  03/03/2022 10:34 AM    Patient seen and examined.  Agree with above documentation.  Paige Woodward is a 51 year old female with history of CVA, hypertension, hyperlipidemia, fibromyalgia, possible atrial fibrillation who presents today with NSTEMI.  She was admitted in February 2023 with chest pain and troponin elevation.  Echocardiogram was unremarkable.  LHC did not show significant coronary artery disease.  She was in her usual state of health until last night around 12 AM was watching TV and had onset of left-sided chest heaviness.  Given symptoms, presented to ED.  In ED, initial vital signs notable for BP 138/85, SPO2 97% on room air, pulse 57.  EKG shows sinus bradycardia, rate 58, T wave inversions in III, aVF.  Labs notable for creatinine 1.07, hemoglobin 12.2, platelets 516, troponin 3793.  Chest x-ray unremarkable.  On exam, patient is alert and oriented, regular rate and rhythm, no murmurs, lungs CTAB, no LE edema or JVD.  Given presentation with chest pain and significant troponin elevation, and considering her ongoing chest pain, recommend cardiac catheterization today.  She does have contrast allergy, will premedicate with Solu-Medrol and Benadryl.  Interestingly, she had similar presentation in February and cath did not show significant CAD.  If cath remains unremarkable, will plan cardiac MRI to evaluate for myocarditis.  Coronary vasospasm would also be on the differential.  Donato Heinz, MD

## 2022-03-03 NOTE — ED Notes (Signed)
Lab reports they do not have a green/troponin on pt. Sent by this RN at [phone]. Will redraw.

## 2022-03-03 NOTE — ED Notes (Addendum)
Pt removed all clothing and anklet, consent signed. Transported to cath lab via transport.

## 2022-03-03 NOTE — ED Provider Triage Note (Signed)
Emergency Medicine Provider Triage Evaluation Note  Paige Woodward , a 51 y.o. female  was evaluated in triage.  Pt complains of substernal/left sided chest pain came on around 1 AM this morning that she was watching TV states she became clammy feels slightly short of breath no near syncope, states that she has had an MI in the past and felt similar, took nitro prior to arrival states she feels slightly better but still has chest pain..  Review of Systems  Positive: Chest pain, shortness of breath Negative: Lightheadedness syncope  Physical Exam  BP 138/85 (BP Location: Left Arm)   Pulse (!) 57   Temp (!) 97.4 F (36.3 C) (Oral)   Resp 17   Ht 5\' 7"  (1.702 m)   Wt 95.3 kg   SpO2 97%   BMI 32.89 kg/m  Gen:   Awake, no distress   Resp:  Normal effort  MSK:   Moves extremities without difficulty  Other:    Medical Decision Making  Medically screening exam initiated at 6:31 AM.  Appropriate orders placed.  Paige Woodward was informed that the remainder of the evaluation will be completed by another provider, this initial triage assessment does not replace that evaluation, and the importance of remaining in the ED until their evaluation is complete.  Lab work imaging ordered will need further work-up.   Marcello Fennel, PA-C 03/03/22 [phone]

## 2022-03-03 NOTE — ED Notes (Signed)
Troponin [id number]

## 2022-03-03 NOTE — Progress Notes (Signed)
ANTICOAGULATION CONSULT NOTE - Initial Consult  Pharmacy Consult for Heparin Indication: chest pain/ACS  Allergies  Allergen Reactions   Ivp Dye [Iodinated Contrast Media] Hives   Lipitor [Atorvastatin]     Leg pain   Penicillins     Childhood allergy    Patient Measurements: Height: 5\' 7"  (170.2 cm) Weight: 95.3 kg (210 lb) IBW/kg (Calculated) : 61.6 Heparin Dosing Weight: 82kg  Vital Signs: Temp: 97.4 F (36.3 C) (10/19 0612) Temp Source: Oral (10/19 0612) BP: 138/85 (10/19 0612) Pulse Rate: 57 (10/19 0612)  Labs: Recent Labs    03/03/22 0645  HGB 12.2  HCT 34.4*  PLT 516*  CREATININE 1.07*  TROPONINIHS 3,793*    Estimated Creatinine Clearance: 73.7 mL/min (A) (by C-G formula based on SCr of 1.07 mg/dL (H)).   Medical History: Past Medical History:  Diagnosis Date   Fibromyalgia    GERD (gastroesophageal reflux disease)    Hypertension    MI (myocardial infarction) ([place])    Ulcerative colitis ([place])     Medications:  Scheduled:   aspirin  325 mg Oral Daily   Infusions:   Assessment: 53 YOF presenting with chest pain. Initial troponin 3793. No anticoagulation prior to admission. Pharmacy consulted to start heparin for ACS.  Renal function is adequate and CBC is WNL.   Goal of Therapy:  Heparin level 0.3-0.7 units/ml Monitor platelets by anticoagulation protocol: Yes   Plan:  4000 units IV bolus once Start heparin 1000 units/hr Check 6hr heparin level Monitor sign/symptoms of bleeding  Merrilee Jansky, PharmD Clinical Pharmacist 03/03/2022,8:59 AM

## 2022-03-03 NOTE — Progress Notes (Signed)
Order for sheath removal verified per post procedural orders. Procedure explained to patient and Rt femoral artery access site assessed: level 0, palpable posterior tibial pulses. 5French Sheath removed and manual pressure applied for 25 minutes. Pre, peri, & post procedural vitals: HR 75-80, RR 15-20, O2 Sat upper 95-98%, BP 175-180/90, b/p was treated during shath pull. Back pain was a 8 ot of 10 chronic  medicine was given for the pain. Distal pulses remained intact after sheath removal. Access site level 0 and dressed with 4X4 gauze and tegaderm. Post procedural instructions discussed and return demonstration from patient. Bed rest starts @ 1650

## 2022-03-03 NOTE — Interval H&P Note (Signed)
Cath Lab Visit (complete for each Cath Lab visit)  Clinical Evaluation Leading to the Procedure:   ACS: Yes.    Non-ACS:    Anginal Classification: CCS II  Anti-ischemic medical therapy: Minimal Therapy (1 class of medications)  Non-Invasive Test Results: No non-invasive testing performed  Prior CABG: No previous CABG      History and Physical Interval Note:  03/03/2022 3:00 PM  Paige Woodward  has presented today for surgery, with the diagnosis of chest pain.  The various methods of treatment have been discussed with the patient and family. After consideration of risks, benefits and other options for treatment, the patient has consented to  Procedure(s): LEFT HEART CATH AND CORONARY ANGIOGRAPHY (N/A) as a surgical intervention.  The patient's history has been reviewed, patient examined, no change in status, stable for surgery.  I have reviewed the patient's chart and labs.  Questions were answered to the patient's satisfaction.     Quay Burow

## 2022-03-03 NOTE — ED Provider Notes (Signed)
[facility] EMERGENCY DEPARTMENT Provider Note   CSN: [id number] Arrival date & time: 03/03/22  0606     History  Chief Complaint  Patient presents with   Chest Pain    Paige Woodward is a 51 y.o. female.  Patient arrives with chest pain that started about 8 hours ago.  History of heart disease.  Had heart attack a couple months ago.  Denies any alcohol or drug use or recent illness including cough.  No shortness of breath or abdominal pain or nausea or vomiting.  She felt like she had some indigestion took Tums but did not help.  She took nitroglycerin which has helped.  Pain is almost gone now.  She denies any recent travel or surgery.  No blood clot history.  Significant cardiac history in the family.  Denies any weakness, numbness, vision changes, headaches.  The history is provided by the patient.       Home Medications Prior to Admission medications   Medication Sig Start Date End Date Taking? Authorizing Provider  acetaminophen (TYLENOL) 650 MG CR tablet Take 1,300 mg by mouth every 8 (eight) hours as needed for pain.    [provider]  amLODipine (NORVASC) 10 MG tablet Take 0.5 tablets (5 mg total) by mouth at bedtime. 08/10/21   Alver Sorrow, NP  buPROPion (WELLBUTRIN SR) 150 MG 12 hr tablet Take one tablet daily for 3 days. Then take one tablet twice daily for 3 months for smoking cessation. 08/10/21   Alver Sorrow, NP  calcium carbonate (ALKA-SELTZER HEARTBURN) 750 MG chewable tablet Chew 1,500 mg by mouth daily as needed for heartburn.    [provider]  clopidogrel (PLAVIX) 75 MG tablet Take 75 mg by mouth at bedtime. 06/14/21   [provider]  hydrOXYzine (VISTARIL) 25 MG capsule Take 25 mg by mouth 2 (two) times daily. 06/17/21   [provider]  metoprolol succinate (TOPROL XL) 25 MG 24 hr tablet Take 1 tablet (25 mg total) by mouth daily. 06/24/21   Nahser, Deloris Ping, MD  nitroGLYCERIN (NITROSTAT) 0.4 MG SL  tablet Place 1 tablet (0.4 mg total) under the tongue every 5 (five) minutes as needed for chest pain. 06/24/21   Nahser, Deloris Ping, MD  nortriptyline (PAMELOR) 25 MG capsule Take 25 mg by mouth at bedtime. 06/16/21   [provider]  oxyCODONE (ROXICODONE) 15 MG immediate release tablet Take 15 mg by mouth 3 (three) times daily as needed for pain.    [provider]  pantoprazole (PROTONIX) 40 MG tablet TAKE 1 TABLET BY MOUTH DAILY(FURTHER REFILLS BY PCP) 09/20/21   Bhagat, Sharrell Ku, PA  rosuvastatin (CRESTOR) 5 MG tablet Take 1 tablet (5 mg total) by mouth daily. 08/10/21 02/06/22  Alver Sorrow, NP      Allergies    Ivp dye [iodinated contrast media], Lipitor [atorvastatin], and Penicillins    Review of Systems   Review of Systems  Physical Exam Updated Vital Signs BP 138/85 (BP Location: Left Arm)   Pulse (!) 57   Temp (!) 97.4 F (36.3 C) (Oral)   Resp 17   Ht 5\' 7"  (1.702 m)   Wt 95.3 kg   SpO2 97%   BMI 32.89 kg/m  Physical Exam Vitals and nursing note reviewed.  Constitutional:      General: She is not in acute distress.    Appearance: She is well-developed. She is not ill-appearing.  HENT:     Head: Normocephalic and atraumatic.  Eyes:     Extraocular Movements: Extraocular movements intact.     Conjunctiva/sclera: Conjunctivae normal.     Pupils: Pupils are equal, round, and reactive to light.  Cardiovascular:     Rate and Rhythm: Normal rate and regular rhythm.     Pulses:          Radial pulses are 2+ on the right side and 2+ on the left side.     Heart sounds: Normal heart sounds. No murmur heard. Pulmonary:     Effort: Pulmonary effort is normal. No respiratory distress.     Breath sounds: Normal breath sounds. No decreased breath sounds, wheezing or rhonchi.  Abdominal:     Palpations: Abdomen is soft.     Tenderness: There is no abdominal tenderness.  Musculoskeletal:        General: No swelling.     Cervical back: Normal range of  motion and neck supple.  Skin:    General: Skin is warm and dry.     Capillary Refill: Capillary refill takes less than 2 seconds.  Neurological:     General: No focal deficit present.     Mental Status: She is alert.  Psychiatric:        Mood and Affect: Mood normal.     ED Results / Procedures / Treatments   Labs (all labs ordered are listed, but only abnormal results are displayed) Labs Reviewed  BASIC METABOLIC PANEL - Abnormal; Notable for the following components:      Result Value   Glucose, Bld 140 (*)    Creatinine, Ser 1.07 (*)    All other components within normal limits  CBC WITH DIFFERENTIAL/PLATELET - Abnormal; Notable for the following components:   WBC 15.8 (*)    HCT 34.4 (*)    Platelets 516 (*)    Neutro Abs 13.3 (*)    Abs Immature Granulocytes 0.09 (*)    All other components within normal limits  TROPONIN I (HIGH SENSITIVITY) - Abnormal; Notable for the following components:   Troponin I (High Sensitivity) 3,793 (*)    All other components within normal limits  TROPONIN I (HIGH SENSITIVITY)    EKG EKG Interpretation  Date/Time:  Thursday March 03 2022 08:28:33 EDT Ventricular Rate:  57 PR Interval:  161 QRS Duration: 92 QT Interval:  430 QTC Calculation: 419 R Axis:   53 Text Interpretation: Sinus rhythm Borderline T abnormalities, inferior leads Confirmed by Virgina Norfolk ([id number]) on 03/03/2022 8:39:24 AM  Radiology DG Chest 2 View  Result Date: 03/03/2022 CLINICAL DATA:  Chest pain. EXAM: CHEST - 2 VIEW COMPARISON:  06/21/2021 FINDINGS: The lungs are clear without focal pneumonia, edema, pneumothorax or pleural effusion. Bibasilar atelectasis noted. The cardiopericardial silhouette is within normal limits for size. The visualized bony structures of the thorax are unremarkable. IMPRESSION: Bibasilar atelectasis.  Otherwise no acute cardiopulmonary findings. Electronically Signed   By: Kennith Center M.D.   On: 03/03/2022 06:57     Procedures .Critical Care  Performed by: Virgina Norfolk, DO Authorized by: Virgina Norfolk, DO   Critical care provider statement:    Critical care time (minutes):  40   Critical care was necessary to treat or prevent imminent or life-threatening deterioration of the following conditions:  Cardiac failure   Critical care was time spent personally by me on the following activities:  Blood draw for specimens, development of treatment plan with patient or surrogate, discussions with primary provider, evaluation of patient's response to treatment, examination of patient, obtaining  history from patient or surrogate, ordering and performing treatments and interventions, ordering and review of laboratory studies, ordering and review of radiographic studies, pulse oximetry, re-evaluation of patient's condition and review of old charts   Care discussed with: admitting provider       Medications Ordered in ED Medications  aspirin tablet 325 mg (has no administration in time range)    ED Course/ Medical Decision Making/ A&P                           Medical Decision Making Risk Decision regarding hospitalization.   Sharmel Ballantine is here with chest pain.  Normal vitals.  No fever.  EKG per my review and interpretation shows sinus rhythm.  No ischemic changes.  Unchanged from prior EKGs.  Comorbidities includes prior MI, hypertension, acid reflux.  Differential diagnosis includes ACS, less likely pneumonia, pneumothorax, PE, dissection.  We will get CBC, BMP, troponin, chest x-ray.  Per my review and interpretation of labs, troponin is significantly elevated at 3700.  Mild leukocytosis of 15.  Otherwise no significant electrolyte abnormality or kidney injury.  Chest x-ray per my review and interpretation shows no evidence of pneumonia or pneumothorax.  Repeat EKG was performed that continued to show no significant ST changes.  Talked with Dr. Gean Quint with cardiology.  We will start the patient on  IV heparin bolus and infusion for NSTEMI.  I did review her prior cardiac history including her cath report back in February that showed clear coronaries.  Not sure if this is vasospasm, would be unlikely to have takusubo twice but possible.  Patient to be admitted to cardiology service for further care.  Chest pain-free at this time.  Hemodynamically stable.  This chart was dictated using voice recognition software.  Despite best efforts to proofread,  errors can occur which can change the documentation meaning.          Final Clinical Impression(s) / ED Diagnoses Final diagnoses:  NSTEMI (non-ST elevated myocardial infarction) [facility])    Rx / DC Orders ED Discharge Orders     None         Lennice Sites, DO 03/03/22 0900

## 2022-03-04 ENCOUNTER — Inpatient Hospital Stay (HOSPITAL_COMMUNITY): Payer: BC Managed Care – PPO

## 2022-03-04 ENCOUNTER — Encounter (HOSPITAL_COMMUNITY): Payer: Self-pay | Admitting: Cardiovascular Disease

## 2022-03-04 ENCOUNTER — Other Ambulatory Visit: Payer: Self-pay | Admitting: Cardiology

## 2022-03-04 DIAGNOSIS — R609 Edema, unspecified: Secondary | ICD-10-CM | POA: Diagnosis not present

## 2022-03-04 DIAGNOSIS — I214 Non-ST elevation (NSTEMI) myocardial infarction: Secondary | ICD-10-CM

## 2022-03-04 DIAGNOSIS — I10 Essential (primary) hypertension: Secondary | ICD-10-CM

## 2022-03-04 DIAGNOSIS — R079 Chest pain, unspecified: Secondary | ICD-10-CM | POA: Diagnosis not present

## 2022-03-04 DIAGNOSIS — R9389 Abnormal findings on diagnostic imaging of other specified body structures: Secondary | ICD-10-CM

## 2022-03-04 LAB — BASIC METABOLIC PANEL
Anion gap: 10 (ref 5–15)
BUN: 9 mg/dL (ref 6–20)
CO2: 22 mmol/L (ref 22–32)
Calcium: 9.7 mg/dL (ref 8.9–10.3)
Chloride: 104 mmol/L (ref 98–111)
Creatinine, Ser: 0.99 mg/dL (ref 0.44–1.00)
GFR, Estimated: >60 mL/min (ref >60)
Glucose, Bld: 153 mg/dL — ABNORMAL HIGH (ref 70–99)
Potassium: 3.7 mmol/L (ref 3.5–5.1)
Sodium: 136 mmol/L (ref 135–145)

## 2022-03-04 LAB — CBC
HCT: 36.1 % (ref 36.0–46.0)
Hemoglobin: 13.5 g/dL (ref 12.0–15.0)
MCH: 31.3 pg (ref 26.0–34.0)
MCHC: 37.4 g/dL — ABNORMAL HIGH (ref 30.0–36.0)
MCV: 83.8 fL (ref 80.0–100.0)
Platelets: 493 10*3/uL — ABNORMAL HIGH (ref 150–400)
RBC: 4.31 MIL/uL (ref 3.87–5.11)
RDW: 14.3 % (ref 11.5–15.5)
WBC: 15.4 10*3/uL — ABNORMAL HIGH (ref 4.0–10.5)
nRBC: 0 % (ref 0.0–0.2)

## 2022-03-04 LAB — ECHOCARDIOGRAM COMPLETE
Area-P 1/2: 3.48 cm2
Height: 67 in
S' Lateral: 2.8 cm
Weight: 3360 oz

## 2022-03-04 MED ORDER — ASPIRIN 81 MG PO TBEC
81.0000 mg | DELAYED_RELEASE_TABLET | Freq: Every day | ORAL | Status: DC
  Administered 2022-03-05: 81 mg via ORAL
  Filled 2022-03-04: qty 1

## 2022-03-04 MED ORDER — GADOBUTROL 1 MMOL/ML IV SOLN
9.5000 mL | Freq: Once | INTRAVENOUS | Status: AC | PRN
  Administered 2022-03-04: 9.5 mL via INTRAVENOUS

## 2022-03-04 MED ORDER — ENOXAPARIN SODIUM 40 MG/0.4ML IJ SOSY
40.0000 mg | PREFILLED_SYRINGE | INTRAMUSCULAR | Status: DC
  Administered 2022-03-04: 40 mg via SUBCUTANEOUS
  Filled 2022-03-04: qty 0.4

## 2022-03-04 MED FILL — Nitroglycerin IV Soln 100 MCG/ML in D5W: INTRA_ARTERIAL | Qty: 10 | Status: AC

## 2022-03-04 NOTE — Progress Notes (Signed)
Rounding Note    Patient Name: Paige NormanSonya Woodward Date of Encounter: 03/04/2022  [facility] Cardiologist: Kristeen MissPhilip Nahser, MD   Subjective   Denies any further chest pain  Inpatient Medications    Scheduled Meds:  amLODipine  5 mg Oral Daily   aspirin  325 mg Oral Daily   clopidogrel  75 mg Oral QHS   hydrOXYzine  50 mg Oral QHS   metoprolol succinate  25 mg Oral QHS   nortriptyline  50 mg Oral QHS   rosuvastatin  5 mg Oral Daily   sodium chloride flush  3 mL Intravenous Q12H   Continuous Infusions:  sodium chloride     PRN Meds: sodium chloride, acetaminophen, acetaminophen, morphine injection, nitroGLYCERIN, ondansetron (ZOFRAN) IV, ondansetron (ZOFRAN) IV, oxyCODONE, pantoprazole, sodium chloride flush   Vital Signs    Vitals:   03/03/22 2318 03/04/22 0326 03/04/22 0641 03/04/22 0834  BP: (!) 140/83 (!) 129/97  133/81  Pulse: 75 100    Resp: 16 20 (!) 21 17  Temp: 98.3 F (36.8 C) 98.3 F (36.8 C)  98.3 F (36.8 C)  TempSrc: Oral Oral    SpO2: 95% 96%    Weight:      Height:        Intake/Output Summary (Last 24 hours) at 03/04/2022 0913 Last data filed at 03/03/2022 2320 Gross per 24 hour  Intake 86.1 ml  Output 350 ml  Net -263.9 ml      03/03/2022    6:18 AM 08/10/2021   10:00 AM 06/21/2021    6:16 AM  Last 3 Weights  Weight (lbs) 210 lb 211 lb 14.4 oz 202 lb 13.2 oz  Weight (kg) 95.255 kg 96.117 kg 92 kg      Telemetry    NSR - Personally Reviewed  ECG    Normal sinus rhythm, rate 64, T wave inversion in lead III- Personally Reviewed  Physical Exam   GEN: No acute distress.   Neck: No JVD Cardiac: RRR, no murmurs, rubs, or gallops.  Respiratory: Clear to auscultation bilaterally. GI: Soft, nontender, non-distended  MS: No edema; No deformity. Neuro:  Nonfocal  Psych: Normal affect   Labs    High Sensitivity Troponin:   Recent Labs  Lab 03/03/22 0645 03/03/22 1113  TROPONINIHS 3,793* 21,343*     Chemistry Recent  Labs  Lab 03/03/22 0645 03/04/22 0015  NA 138 136  K 4.0 3.7  CL 103 104  CO2 26 22  GLUCOSE 140* 153*  BUN 11 9  CREATININE 1.07* 0.99  CALCIUM 9.9 9.7  GFRNONAA >60 >60  ANIONGAP 9 10    Lipids No results for input(s): "CHOL", "TRIG", "HDL", "LABVLDL", "LDLCALC", "CHOLHDL" in the last 168 hours.  Hematology Recent Labs  Lab 03/03/22 0645 03/04/22 0015  WBC 15.8* 15.4*  RBC 3.97 4.31  HGB 12.2 13.5  HCT 34.4* 36.1  MCV 86.6 83.8  MCH 30.7 31.3  MCHC 35.5 37.4*  RDW 14.2 14.3  PLT 516* 493*   Thyroid No results for input(s): "TSH", "FREET4" in the last 168 hours.  BNPNo results for input(s): "BNP", "PROBNP" in the last 168 hours.  DDimer  Recent Labs  Lab 03/03/22 1113  DDIMER 0.53*     Radiology    CARDIAC CATHETERIZATION  Result Date: 03/03/2022 Images from the original result were not included.   The left ventricular systolic function is normal.   LV end diastolic pressure is moderately elevated.   The left ventricular ejection fraction is 55-65%  by visual estimate. Paige Woodward is a 51 y.o. female  [id number] LOCATION:  FACILITY: [facility] PHYSICIAN: Nanetta Batty, M.D. 1971/04/29 DATE OF PROCEDURE:  03/03/2022 DATE OF DISCHARGE: CARDIAC CATHETERIZATION History obtained from chart review.Paige Woodward is a 51 y.o. female with history of CAD, hypertension, hyperlipidemia, stroke/TIA, fibromyalgia/chronic pain syndrome, possible atrial fibrillation (CVA in 2018 with questionable atrial fibrillation at that time, on Eliquis for few months then discontinued) who is being seen 03/03/2022 for the evaluation of chest pain.  Her EKG was unchanged.  Her troponins rose to 21,000.  She was referred for diagnostic coronary angiography.  She did have a heart catheterization performed in February this year practicality that showed normal coronary arteries. PROCEDURE DESCRIPTION: The patient was brought to the second floor [place] [facility]iac cath lab in the postabsorptive state.  She was  premedicated with IV Versed and fentanyl.  In addition, she got IV Solu-Medrol and Benadryl for contrast allergy prophylaxis.  Her right wrist and groin Were prepped and shaved in usual sterile fashion. Xylocaine 1% was used for local anesthesia. A 5 French sheath was inserted into the right common femoral artery using standard Seldinger technique.  I attempted right radial access unsuccessfully despite using ultrasound guidance.  5 French right and left Judkins diagnostic catheters were used for selective coronary angiography and left ventriculography respectively.  Isovue dye is used for the entirety of the case (40 cc of contrast total to patient).  Retroaortic, left ventricular pullback pressures were recorded.  LVEDP was measured at 29.  I did perform a right common femoral angiogram but I think the the femoral stick was slightly high and therefore I elected not to perform Mynx closure.   Ms. Bittinger has normal coronary arteries as she did back in February of this year.  Her LV function was normal.  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  The sheath was removed and pressure held in the groin to achieve hemostasis.  The patient left lab in stable condition.  She would benefit from being started on a low-dose calcium channel blocker. Nanetta Batty. MD, [facility] 03/03/2022 3:46 PM    ECHOCARDIOGRAM COMPLETE  Result Date: 03/03/2022    ECHOCARDIOGRAM REPORT   Patient Name:   Paige Woodward Date of Exam: 03/03/2022 Medical Rec #:  [id number]     Height:       67.0 in Accession #:    [id number]    Weight:       210.0 lb Date of Birth:  1970-09-23     BSA:          2.065 m Patient Age:    51 years      BP:           134/82 mmHg Patient Gender: F             HR:           56 bpm. Exam Location:  Inpatient Procedure: 2D Echo, Strain Analysis and Intracardiac Opacification Agent Indications:    chets pain and elevated troponin  History:        Patient has prior history of Echocardiogram examinations, most                  recent 06/21/2021. CAD, Stroke; Signs/Symptoms:Chest Pain.  Sonographer:    Cathie Hoops Referring Phys: [id number] Jonita Albee IMPRESSIONS  1. Left ventricular ejection fraction, by estimation, is 60 to 65%. The left ventricle has normal function. The left ventricle has no regional  wall motion abnormalities. Left ventricular diastolic parameters were normal. The average left ventricular global longitudinal strain is -22.5 %. The global longitudinal strain is normal.  2. Right ventricular systolic function is normal. The right ventricular size is normal. Tricuspid regurgitation signal is inadequate for assessing PA pressure.  3. The mitral valve is normal in structure. Trivial mitral valve regurgitation. No evidence of mitral stenosis.  4. The aortic valve has an indeterminant number of cusps. Aortic valve regurgitation is not visualized. No aortic stenosis is present.  5. The inferior vena cava is normal in size with greater than 50% respiratory variability, suggesting right atrial pressure of 3 mmHg. Comparison(s): No significant change from prior study. FINDINGS  Left Ventricle: Left ventricular ejection fraction, by estimation, is 60 to 65%. The left ventricle has normal function. The left ventricle has no regional wall motion abnormalities. Definity contrast agent was given IV to delineate the left ventricular  endocardial borders. The average left ventricular global longitudinal strain is -22.5 %. The global longitudinal strain is normal. The left ventricular internal cavity size was normal in size. There is no left ventricular hypertrophy. Left ventricular diastolic parameters were normal. Right Ventricle: The right ventricular size is normal. Right ventricular systolic function is normal. Tricuspid regurgitation signal is inadequate for assessing PA pressure. The tricuspid regurgitant velocity is 1.64 m/s, and with an assumed right atrial  pressure of 3 mmHg, the estimated right ventricular  systolic pressure is 13.8 mmHg. Left Atrium: Left atrial size was normal in size. Right Atrium: Right atrial size was normal in size. Pericardium: There is no evidence of pericardial effusion. Mitral Valve: The mitral valve is normal in structure. Trivial mitral valve regurgitation. No evidence of mitral valve stenosis. Tricuspid Valve: The tricuspid valve is normal in structure. Tricuspid valve regurgitation is trivial. No evidence of tricuspid stenosis. Aortic Valve: The aortic valve has an indeterminant number of cusps. Aortic valve regurgitation is not visualized. No aortic stenosis is present. Aortic valve mean gradient measures 3.0 mmHg. Aortic valve peak gradient measures 6.7 mmHg. Aortic valve area, by VTI measures 2.52 cm. Pulmonic Valve: The pulmonic valve was normal in structure. Pulmonic valve regurgitation is not visualized. No evidence of pulmonic stenosis. Aorta: The aortic root is normal in size and structure. Venous: The inferior vena cava is normal in size with greater than 50% respiratory variability, suggesting right atrial pressure of 3 mmHg. IAS/Shunts: No atrial level shunt detected by color flow Doppler.  LEFT VENTRICLE PLAX 2D LVIDd:         4.80 cm      Diastology LVIDs:         3.30 cm      LV e' medial:    7.83 cm/s LV PW:         0.90 cm      LV E/e' medial:  12.7 LV IVS:        0.90 cm      LV e' lateral:   9.68 cm/s LVOT diam:     1.90 cm      LV E/e' lateral: 10.3 LV SV:         76 LV SV Index:   37           2D Longitudinal Strain LVOT Area:     2.84 cm     2D Strain GLS Avg:     -22.5 %  LV Volumes (MOD) LV vol d, MOD A2C: 127.0 ml LV vol d, MOD A4C: 109.0 ml LV vol  s, MOD A2C: 55.0 ml LV vol s, MOD A4C: 54.5 ml LV SV MOD A2C:     72.0 ml LV SV MOD A4C:     109.0 ml LV SV MOD BP:      64.7 ml RIGHT VENTRICLE RV Basal diam:  3.50 cm RV Mid diam:    3.00 cm RV S prime:     13.80 cm/s TAPSE (M-mode): 3.2 cm LEFT ATRIUM             Index        RIGHT ATRIUM           Index LA diam:         3.10 cm 1.50 cm/m   RA Area:     11.40 cm LA Vol (A2C):   92.7 ml 44.90 ml/m  RA Volume:   24.70 ml  11.96 ml/m LA Vol (A4C):   52.0 ml 25.19 ml/m LA Biplane Vol: 77.2 ml 37.39 ml/m  AORTIC VALVE                    PULMONIC VALVE AV Area (Vmax):    2.44 cm     PV Vmax:       0.84 m/s AV Area (Vmean):   2.94 cm     PV Peak grad:  2.8 mmHg AV Area (VTI):     2.52 cm AV Vmax:           129.00 cm/s AV Vmean:          82.400 cm/s AV VTI:            0.302 m AV Peak Grad:      6.7 mmHg AV Mean Grad:      3.0 mmHg LVOT Vmax:         111.00 cm/s LVOT Vmean:        85.500 cm/s LVOT VTI:          0.268 m LVOT/AV VTI ratio: 0.89  AORTA Ao Root diam: 3.20 cm Ao Asc diam:  3.30 cm MITRAL VALVE                TRICUSPID VALVE MV Area (PHT): 3.91 cm     TR Peak grad:   10.8 mmHg MV Decel Time: 194 msec     TR Vmax:        164.00 cm/s MR Peak grad: 40.4 mmHg MR Vmax:      317.67 cm/s   SHUNTS MV E velocity: 99.40 cm/s   Systemic VTI:  0.27 m MV A velocity: 108.00 cm/s  Systemic Diam: 1.90 cm MV E/A ratio:  0.92 Olga Millers MD Electronically signed by Olga Millers MD Signature Date/Time: 03/03/2022/12:10:05 PM    Final    DG Chest 2 View  Result Date: 03/03/2022 CLINICAL DATA:  Chest pain. EXAM: CHEST - 2 VIEW COMPARISON:  06/21/2021 FINDINGS: The lungs are clear without focal pneumonia, edema, pneumothorax or pleural effusion. Bibasilar atelectasis noted. The cardiopericardial silhouette is within normal limits for size. The visualized bony structures of the thorax are unremarkable. IMPRESSION: Bibasilar atelectasis.  Otherwise no acute cardiopulmonary findings. Electronically Signed   By: Kennith Center M.D.   On: 03/03/2022 06:57    Cardiac Studies     Patient Profile     51 y.o. female with history of CAD, hypertension, hyperlipidemia, stroke/TIA, fibromyalgia/chronic pain syndrome, possible atrial fibrillation (CVA in 2018 with questionable atrial fibrillation at that time, on Eliquis for few months  then discontinued) who  is being seen 03/03/2022 for the evaluation of chest pain  Assessment & Plan    Chest pain with elevated troponin: Presented with acute chest pain and troponin elevation over 21,000.  EKG with inferior T wave inversions.  Cath showed no CAD.  Interestingly had similar presentation in February 2023 (and also reports 2 prior episodes). 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

## 2022-03-04 NOTE — Progress Notes (Signed)
Mobility Specialist Progress Note    03/04/22 1010  Mobility  Activity Ambulated independently in hallway  Level of Assistance Independent  Assistive Device None  Distance Ambulated (ft) 420 ft  Activity Response Tolerated well  $Mobility charge 1 Mobility   Pre-Mobility: 78 HR, 129/79 (92) BP During Mobility: 99 HR Post-Mobility: 90 HR  Pt received in bed and agreeable. No complaints on walk. Returned to bed with call bell in reach.    Hildred Alamin Mobility Specialist  Secure Chat Only

## 2022-03-04 NOTE — Plan of Care (Signed)
  Problem: Education: Goal: Understanding of cardiac disease, CV risk reduction, and recovery process will improve Outcome: Progressing   Problem: Activity: Goal: Ability to return to baseline activity level will improve Outcome: Progressing   Problem: Health Behavior/Discharge Planning: Goal: Ability to safely manage health-related needs after discharge will improve Outcome: Progressing   Problem: Clinical Measurements: Goal: Cardiovascular complication will be avoided Outcome: Progressing   Problem: Coping: Goal: Level of anxiety will decrease Outcome: Progressing   Problem: Pain Managment: Goal: General experience of comfort will improve Outcome: Progressing

## 2022-03-04 NOTE — Progress Notes (Signed)
  Echocardiogram 2D Echocardiogram has been performed.  Paige Woodward M 03/04/2022, 2:14 PM

## 2022-03-04 NOTE — Progress Notes (Signed)
Patient with abnormal cardiac MRI. Zio monitor per Dr. Gardiner Rhyme to rule out paroxysmal afib.

## 2022-03-04 NOTE — TOC Progression Note (Signed)
Transition of Care [facility]) - Progression Note    Patient Details  Name: Paige Woodward MRN: [id number] Date of Birth: 1971-02-27  [facility] CM/SW Contact  Zenon Mayo, RN Phone Number: 03/04/2022, 2:14 PM  Clinical Narrative:    From home, NSTEMI, for echo, MRI, s/p cath yesterday, has no needs. TOC following.         Expected Discharge Plan and Services                                                 Social Determinants of Health (SDOH) Interventions    Readmission Risk Interventions     No data to display

## 2022-03-04 NOTE — Progress Notes (Signed)
Bilateral lower extremity venous duplex completed. Refer to "CV Proc" under chart review to view preliminary results.  03/04/2022 3:41 PM Kelby Aline., MHA, RVT, RDCS, RDMS

## 2022-03-05 ENCOUNTER — Other Ambulatory Visit: Payer: Self-pay | Admitting: Physician Assistant

## 2022-03-05 DIAGNOSIS — E876 Hypokalemia: Secondary | ICD-10-CM | POA: Diagnosis not present

## 2022-03-05 DIAGNOSIS — I10 Essential (primary) hypertension: Secondary | ICD-10-CM | POA: Diagnosis not present

## 2022-03-05 DIAGNOSIS — I214 Non-ST elevation (NSTEMI) myocardial infarction: Secondary | ICD-10-CM | POA: Diagnosis not present

## 2022-03-05 DIAGNOSIS — R079 Chest pain, unspecified: Secondary | ICD-10-CM

## 2022-03-05 LAB — BASIC METABOLIC PANEL
Anion gap: 7 (ref 5–15)
BUN: 13 mg/dL (ref 6–20)
CO2: 23 mmol/L (ref 22–32)
Calcium: 8.9 mg/dL (ref 8.9–10.3)
Chloride: 108 mmol/L (ref 98–111)
Creatinine, Ser: 1.13 mg/dL — ABNORMAL HIGH (ref 0.44–1.00)
GFR, Estimated: 59 mL/min — ABNORMAL LOW (ref >60)
Glucose, Bld: 100 mg/dL — ABNORMAL HIGH (ref 70–99)
Potassium: 3.2 mmol/L — ABNORMAL LOW (ref 3.5–5.1)
Sodium: 138 mmol/L (ref 135–145)

## 2022-03-05 LAB — LIPOPROTEIN A (LPA): Lipoprotein (a): <8.4 nmol/L (ref <75.0)

## 2022-03-05 MED ORDER — POTASSIUM CHLORIDE CRYS ER 20 MEQ PO TBCR
40.0000 meq | EXTENDED_RELEASE_TABLET | Freq: Once | ORAL | Status: AC
  Administered 2022-03-05: 40 meq via ORAL
  Filled 2022-03-05 (×2): qty 2

## 2022-03-05 MED ORDER — ASPIRIN 81 MG PO TBEC: 81.0000 mg | DELAYED_RELEASE_TABLET | Freq: Every day | ORAL | 12 refills | Status: DC

## 2022-03-05 MED ORDER — AMLODIPINE BESYLATE 5 MG PO TABS: 5.0000 mg | ORAL_TABLET | Freq: Every day | ORAL | 3 refills | Status: DC

## 2022-03-05 NOTE — Discharge Summary (Cosign Needed Addendum)
Discharge Summary    Patient ID: Paige Woodward MRN: [id number]; DOB: 31-Aug-1970  Admit date: 03/03/2022 Discharge date: 03/05/2022  PCP:  Trey Sailors, [place] [facility] Cardiologist:  Mertie Moores, MD        Discharge Diagnoses    Principal Problem:   NSTEMI (non-ST elevated myocardial infarction) [facility]) Active Problems:   Essential hypertension   Hypokalemia    Diagnostic Studies/Procedures    cMRI 03/04/22 IMPRESSION: 1. Subendocardial late gadolinium enhancement in focal region of basal inferior wall consistent with infarct.   2.  No evidence of myocarditis   3. Normal LV size, wall thickness, and global systolic function (EF [id number]). Focal region of hypokinesis in basal inferior wall   4.  Normal RV size and systolic function (EF AB-123456789)     Electronically Signed   By: Oswaldo Milian M.D.   On: 03/04/2022 16:01   _____________  CARDIAC CATHETERIZATION 03/03/22       History obtained from chart review.Paige Woodward is a 51 y.o. female with history of CAD, hypertension, hyperlipidemia, stroke/TIA, fibromyalgia/chronic pain syndrome, possible atrial fibrillation (CVA in 2018 with questionable atrial fibrillation at that time, on Eliquis for few months then discontinued) who is being seen 03/03/2022 for the evaluation of chest pain.  Her EKG was unchanged.  Her troponins rose to 21,000.  She was referred for diagnostic coronary angiography.  She did have a heart catheterization performed in February this year practicality that showed normal coronary arteries.  IMPRESSION: Ms. Helfman has normal coronary arteries as she did back in February of this year.  Her LV function was normal.  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  The sheath was removed and pressure held in the groin to achieve hemostasis.  The patient left lab in stable condition.  She would benefit from being started on a low-dose calcium channel blocker.    _____________    Echo 03/04/22 IMPRESSIONS   1. Left ventricular ejection fraction, by estimation, is 65 to 70%. The  left ventricle has normal function. The left ventricle has no regional  wall motion abnormalities. Left ventricular diastolic parameters were  normal.   2. Right ventricular systolic function is normal. The right ventricular  size is normal.   3. MR is eccentric, directed anterior into LA. Mild mitral valve  regurgitation.   4. The aortic valve is normal in structure. Aortic valve regurgitation is  not visualized.   History of Present Illness     Paige Woodward is a 51 y.o. female with a history of CAD, hypertension, hyperlipidemia, stroke/TIA, fibromyalgia/chronic pain syndrome, possible atrial fibrillation (CVA in 2018 with questionable atrial fibrillation at that time, on Eliquis for few months then discontinued) who was admitted 03/03/2022 for the evaluation of chest pain.  Patient reports approximately 4 episodes of severe chest pain over the past 15 years without clear cause. She says that the first of these episodes occurred when she was 36.  Evaluation took place at [facility] and per patient she had a negative left heart catheterization.  Most recently patient was admitted this past February with symptoms of chest pain and elevated troponin.  Transthoracic echocardiogram was without reduced ejection fraction or acute wall motion abnormalities and left heart catheterization was negative for significant coronary artery disease.  Patient was most recently seen in follow-up by Laurann Montana nurse practitioner on March 28.  At this follow-up appointment she was continued on metoprolol 25 mg once  daily.  She was also started on amlodipine 5 mg once daily per notes. However, per patient's understanding of this visit, she has not been taking.  She presented to [facility] ED on 03/03/22 AM with crushing 10/10 chest pain associated with nausea and diaphoresis.  She had mild improvement SL NTG x2. ECG with T wave inversion in leads III and aVF, similar to historical tracings and troponin noted to be 3793. She was admitted to the cardiology service for further work up.   Hospital Course     Consultants: none   Chest pain with elevated troponin: presented with acute chest pain and troponin elevation over 21,000.  EKG with inferior T wave inversions.  Cath showed no CAD.  Inflammatory markers negative. Echo with normal LV function and mild MR. Interestingly had similar presentation in February 2023 (and also reports 2 prior episodes).  Cardiac MRI showed no evidence of myocarditis but did have basal inferior wall infarct; focal region of hypokinesis and basal inferior wall but normal LVEF and RVEF.  Differential includes coronary vasospasm or embolic event. Ddimer mildly elevated but LE duplex shows no DVT. 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 Given hx of CVA and inability to tolerate aspirin, I will keep her on Plavix at discharge.  -Continue toprol XL 25 mg daily -Plan to place Zio at discharge to r/o afib but not available on weekend. I will send a message to our monitor coordinator to have this mailed to her house.    Hypertension: Continue metoprolol, added amlodipine as above   Hyperlipidemia: Continue rosuvastatin 5 mg daily   Hypokalemia: K 3.2 this morning, unclear cause.  Repleted.  Plan to recheck BMET in 1 week. Apt made for 10/30   Disposition: Okay to discharge today, with plan for Zio patch x2 weeks as above.  Follow-up with Richardson Dopp scheduled for 11/6. Bmet set up for 10/30      Did the patient have an acute coronary syndrome (MI, NSTEMI, STEMI, etc) this admission?:  Yes                               AHA/ACC Clinical Performance & Quality Measures: Aspirin prescribed? - Yes ADP Receptor Inhibitor (Plavix/Clopidogrel,  Brilinta/Ticagrelor or Effient/Prasugrel) prescribed (includes medically managed patients)? - Yes Beta Blocker prescribed? - Yes High Intensity Statin (Lipitor 40-80mg  or Crestor 20-40mg ) prescribed? - Yes EF assessed during THIS hospitalization? - Yes For EF <40%, was ACEI/ARB prescribed? - Not Applicable (EF >/= [id number]) For EF <40%, Aldosterone Antagonist (Spironolactone or Eplerenone) prescribed? - Not Applicable (EF >/= [id number]) Cardiac Rehab Phase II ordered (including medically managed patients)? - No -        _____________  Discharge Vitals Blood pressure 137/84, pulse 65, temperature 98 F (36.7 C), temperature source Oral, resp. rate 12, height 5\' 7"  (1.702 m), weight 95.3 kg, SpO2 96 %.  Filed Weights   03/03/22 0618  Weight: 95.3 kg    Labs & Radiologic Studies    CBC Recent Labs    03/03/22 0645 03/04/22 0015  WBC 15.8* 15.4*  NEUTROABS 13.3*  --   HGB 12.2 13.5  HCT 34.4* 36.1  MCV 86.6 83.8  PLT 516* [id number]*   Basic Metabolic Panel Recent Labs    03/04/22 0015 03/05/22 0029  NA 136 138  K 3.7 3.2*  CL  104 108  CO2 22 23  GLUCOSE 153* 100*  BUN 9 13  CREATININE 0.99 1.13*  CALCIUM 9.7 8.9   Liver Function Tests No results for input(s): "AST", "ALT", "ALKPHOS", "BILITOT", "PROT", "ALBUMIN" in the last 72 hours. No results for input(s): "LIPASE", "AMYLASE" in the last 72 hours. High Sensitivity Troponin:   Recent Labs  Lab 03/03/22 0645 03/03/22 1113  TROPONINIHS 3,793* 21,343*    BNP Invalid input(s): "POCBNP" D-Dimer Recent Labs    03/03/22 1113  DDIMER 0.53*   Hemoglobin A1C No results for input(s): "HGBA1C" in the last 72 hours. Fasting Lipid Panel No results for input(s): "CHOL", "HDL", "LDLCALC", "TRIG", "CHOLHDL", "LDLDIRECT" in the last 72 hours. Thyroid Function Tests No results for input(s): "TSH", "T4TOTAL", "T3FREE", "THYROIDAB" in the last 72 hours.  Invalid input(s): "FREET3" _____________  VAS [place] LOWER EXTREMITY VENOUS  (DVT)  Result Date: 03/04/2022  Lower Venous DVT Study Patient Name:  ESHE BAINUM  Date of Exam:   03/04/2022 Medical Rec #: [id number]      Accession #:    [id number] Date of Birth: 1970/10/20      Patient Gender: F Patient Age:   40 years Exam Location:  [facility] Procedure:      VAS [place] LOWER EXTREMITY VENOUS (DVT) Referring Phys: Oswaldo Milian --------------------------------------------------------------------------------  Indications: Edema.  Comparison Study: No prior study Performing Technologist: Maudry Mayhew MHA, RDMS, RVT, RDCS  Examination Guidelines: A complete evaluation includes B-mode imaging, spectral Doppler, color Doppler, and power Doppler as needed of all accessible portions of each vessel. Bilateral testing is considered an integral part of a complete examination. Limited examinations for reoccurring indications may be performed as noted. The reflux portion of the exam is performed with the patient in reverse Trendelenburg.  +---------+---------------+---------+-----------+---------------+--------------+ RIGHT    CompressibilityPhasicitySpontaneityProperties     Thrombus Aging +---------+---------------+---------+-----------+---------------+--------------+ CFV      Full           Yes      Yes                                      +---------+---------------+---------+-----------+---------------+--------------+ SFJ      Full                                                             +---------+---------------+---------+-----------+---------------+--------------+ FV Prox  Full                                                             +---------+---------------+---------+-----------+---------------+--------------+ FV Mid   Full                                                             +---------+---------------+---------+-----------+---------------+--------------+ FV DistalFull                                                              +---------+---------------+---------+-----------+---------------+--------------+  PFV      Full                                                             +---------+---------------+---------+-----------+---------------+--------------+ POP      Full           Yes      Yes                                      +---------+---------------+---------+-----------+---------------+--------------+ PTV                              Yes        patent by color                                                           Doppler                       +---------+---------------+---------+-----------+---------------+--------------+ PERO                             Yes        patent by color                                                           Doppler                       +---------+---------------+---------+-----------+---------------+--------------+   +---------+---------------+---------+-----------+----------+--------------+ LEFT     CompressibilityPhasicitySpontaneityPropertiesThrombus Aging +---------+---------------+---------+-----------+----------+--------------+ CFV      Full           Yes      Yes                                 +---------+---------------+---------+-----------+----------+--------------+ SFJ      Full                                                        +---------+---------------+---------+-----------+----------+--------------+ FV Prox  Full                                                        +---------+---------------+---------+-----------+----------+--------------+ FV Mid   Full                                                        +---------+---------------+---------+-----------+----------+--------------+  FV DistalFull                                                        +---------+---------------+---------+-----------+----------+--------------+ PFV      Full                                                         +---------+---------------+---------+-----------+----------+--------------+ POP      Full           Yes      Yes                                 +---------+---------------+---------+-----------+----------+--------------+ PTV      Full                                                        +---------+---------------+---------+-----------+----------+--------------+ PERO     Full                                                        +---------+---------------+---------+-----------+----------+--------------+     Summary: RIGHT: - There is no evidence of deep vein thrombosis in the lower extremity. However, portions of this examination were limited- see technologist comments above.  - No cystic structure found in the popliteal fossa.  LEFT: - There is no evidence of deep vein thrombosis in the lower extremity.  - No cystic structure found in the popliteal fossa.  *See table(s) above for measurements and observations. Electronically signed by Servando Snare MD on 03/04/2022 at 5:28:47 PM.    Final    MR CARDIAC VELOCITY FLOW MAP  Result Date: 03/04/2022 CLINICAL DATA:  12F with MINOCA EXAM: CARDIAC MRI TECHNIQUE: The patient was scanned on a 1.5 Tesla Siemens magnet. A dedicated cardiac coil was used. Functional imaging was done using Fiesta sequences. 2,3, and 4 chamber views were done to assess for RWMA's. Modified Simpson's rule using a short axis stack was used to calculate an ejection fraction on a dedicated work Conservation officer, nature. The patient received 9.5 cc of Gadavist. After 10 minutes inversion recovery sequences were used to assess for infiltration and scar tissue. Phase contrast velocity mapping was performed. CONTRAST:  9.5 cc  of Gadavist FINDINGS: Left ventricle: -Normal wall thickness -Normal global systolic function with focal region of hypokinesis in basal inferior wall -Normal ECV (28%) -Normal T2 values -Subendocardial LGE in focal region of basal  inferior wall consistent with infarct. LV EF:  59% (Normal 56-78%) Absolute volumes: LV EDV: 171mL (Normal 52-141 mL) LV ESV: 44mL (Normal 13-51 mL) LV SV: 145mL (Normal 33-97 mL) CO: 6.6L/min (Normal 2.7-6.0 L/min) Indexed volumes: LV EDV: 72mL/sq-m (Normal 41-81 mL/sq-m) LV ESV: 64mL/sq-m (Normal 12-21 mL/sq-m) LV SV: 71mL/sq-m (Normal 26-56 mL/sq-m) CI: 3.1L/min/sq-m (Normal 1.8-3.8 L/min/sq-m)  Right ventricle: Normal size and systolic function RV EF: [id number] (Normal 47-80%) Absolute volumes: RV EDV: 130mL (Normal 58-154 mL) RV ESV: 23mL (Normal 12-68 mL) RV SV: 119mL (Normal 35-98 mL) CO: 6.5L/min (Normal 2.7-6 L/min) Indexed volumes: RV EDV: 55mL/sq-m (Normal 48-87 mL/sq-m) RV ESV: 35mL/sq-m (Normal 11-28 mL/sq-m) RV SV: 41mL/sq-m (Normal 27-57 mL/sq-m) CI: 3.1L/min/sq-m (Normal 1.8-3.8 L/min/sq-m) Left atrium: Mild enlargement Right atrium: Mild enlargement Mitral valve: No regurgitation Aortic valve: Tricuspid. Trivial regurgitation Tricuspid valve: Trivial regurgitation Pulmonic valve: No regurgitation Aorta: Mild dilatation of ascending aorta measuring 42mm Pericardium: Normal IMPRESSION: 1. Subendocardial late gadolinium enhancement in focal region of basal inferior wall consistent with infarct. 2.  No evidence of myocarditis 3. Normal LV size, wall thickness, and global systolic function (EF XX123456). Focal region of hypokinesis in basal inferior wall 4. Normal RV size and systolic function (EF [id number]) Electronically Signed   By: Oswaldo Milian M.D.   On: 03/04/2022 16:01   MR CARDIAC MORPHOLOGY W WO CONTRAST  Result Date: 03/04/2022 CLINICAL DATA:  89F with MINOCA EXAM: CARDIAC MRI TECHNIQUE: The patient was scanned on a 1.5 Tesla Siemens magnet. A dedicated cardiac coil was used. Functional imaging was done using Fiesta sequences. 2,3, and 4 chamber views were done to assess for RWMA's. Modified Simpson's rule using a short axis stack was used to calculate an ejection fraction on a dedicated work  Conservation officer, nature. The patient received 9.5 cc of Gadavist. After 10 minutes inversion recovery sequences were used to assess for infiltration and scar tissue. Phase contrast velocity mapping was performed. CONTRAST:  9.5 cc  of Gadavist FINDINGS: Left ventricle: -Normal wall thickness -Normal global systolic function with focal region of hypokinesis in basal inferior wall -Normal ECV (28%) -Normal T2 values -Subendocardial LGE in focal region of basal inferior wall consistent with infarct. LV EF:  59% (Normal 56-78%) Absolute volumes: LV EDV: 137mL (Normal 52-141 mL) LV ESV: 36mL (Normal 13-51 mL) LV SV: 121mL (Normal 33-97 mL) CO: 6.6L/min (Normal 2.7-6.0 L/min) Indexed volumes: LV EDV: 30mL/sq-m (Normal 41-81 mL/sq-m) LV ESV: 21mL/sq-m (Normal 12-21 mL/sq-m) LV SV: 89mL/sq-m (Normal 26-56 mL/sq-m) CI: 3.1L/min/sq-m (Normal 1.8-3.8 L/min/sq-m) Right ventricle: Normal size and systolic function RV EF: [id number] (Normal 47-80%) Absolute volumes: RV EDV: 182mL (Normal 58-154 mL) RV ESV: 89mL (Normal 12-68 mL) RV SV: 157mL (Normal 35-98 mL) CO: 6.5L/min (Normal 2.7-6 L/min) Indexed volumes: RV EDV: 46mL/sq-m (Normal 48-87 mL/sq-m) RV ESV: 56mL/sq-m (Normal 11-28 mL/sq-m) RV SV: 56mL/sq-m (Normal 27-57 mL/sq-m) CI: 3.1L/min/sq-m (Normal 1.8-3.8 L/min/sq-m) Left atrium: Mild enlargement Right atrium: Mild enlargement Mitral valve: No regurgitation Aortic valve: Tricuspid. Trivial regurgitation Tricuspid valve: Trivial regurgitation Pulmonic valve: No regurgitation Aorta: Mild dilatation of ascending aorta measuring 3mm Pericardium: Normal IMPRESSION: 1. Subendocardial late gadolinium enhancement in focal region of basal inferior wall consistent with infarct. 2.  No evidence of myocarditis 3. Normal LV size, wall thickness, and global systolic function (EF XX123456). Focal region of hypokinesis in basal inferior wall 4.  Normal RV size and systolic function (EF [id number]) Electronically Signed   By: Oswaldo Milian  M.D.   On: 03/04/2022 16:01   ECHOCARDIOGRAM COMPLETE  Result Date: 03/04/2022    ECHOCARDIOGRAM REPORT   Patient Name:   Arnola Nordby Date of Exam: 03/04/2022 Medical Rec #:  [id number]     Height:       67.0 in Accession #:    [id number]    Weight:       210.0 lb Date of Birth:  1970-07-15  BSA:          2.065 m Patient Age:    59 years      BP:           155/86 mmHg Patient Gender: F             HR:           86 bpm. Exam Location:  Inpatient Procedure: 2D Echo, 3D Echo, Cardiac Doppler and Color Doppler Indications:    NSTEMI I21.4  History:        Patient has prior history of Echocardiogram examinations, most                 recent 03/03/2022. Previous Myocardial Infarction; Risk                 Factors:Hypertension. GERD.  Sonographer:    Darlina Sicilian RDCS Referring Phys: Pearletha Forge BERRY IMPRESSIONS  1. Left ventricular ejection fraction, by estimation, is 65 to 70%. The left ventricle has normal function. The left ventricle has no regional wall motion abnormalities. Left ventricular diastolic parameters were normal.  2. Right ventricular systolic function is normal. The right ventricular size is normal.  3. MR is eccentric, directed anterior into LA. Mild mitral valve regurgitation.  4. The aortic valve is normal in structure. Aortic valve regurgitation is not visualized. FINDINGS  Left Ventricle: Left ventricular ejection fraction, by estimation, is 65 to 70%. The left ventricle has normal function. The left ventricle has no regional wall motion abnormalities. The left ventricular internal cavity size was normal in size. There is  no left ventricular hypertrophy. Left ventricular diastolic parameters were normal. Right Ventricle: The right ventricular size is normal. Right vetricular wall thickness was not assessed. Right ventricular systolic function is normal. Left Atrium: Left atrial size was normal in size. Right Atrium: Right atrial size was normal in size. Pericardium: There is no evidence  of pericardial effusion. Mitral Valve: MR is eccentric, directed anterior into LA. Mild mitral valve regurgitation. Tricuspid Valve: The tricuspid valve is normal in structure. Tricuspid valve regurgitation is trivial. Aortic Valve: The aortic valve is normal in structure. Aortic valve regurgitation is not visualized. Pulmonic Valve: The pulmonic valve was grossly normal. Pulmonic valve regurgitation is not visualized. Aorta: The aortic root is normal in size and structure. IAS/Shunts: No atrial level shunt detected by color flow Doppler.  LEFT VENTRICLE PLAX 2D LVIDd:         4.80 cm   Diastology LVIDs:         2.80 cm   LV e' medial:    7.30 cm/s LV PW:         0.90 cm   LV E/e' medial:  12.7 LV IVS:        0.90 cm   LV e' lateral:   9.86 cm/s LVOT diam:     2.00 cm   LV E/e' lateral: 9.4 LV SV:         94 LV SV Index:   45 LVOT Area:     3.14 cm                           3D Volume EF:                          3D EF:        61 %  LV EDV:       135 ml                          LV ESV:       52 ml                          LV SV:        82 ml RIGHT VENTRICLE RV S prime:     18.60 cm/s TAPSE (M-mode): 3.1 cm LEFT ATRIUM             Index        RIGHT ATRIUM           Index LA diam:        4.00 cm 1.94 cm/m   RA Area:     13.20 cm LA Vol (A2C):   47.8 ml 23.15 ml/m  RA Volume:   28.30 ml  13.71 ml/m LA Vol (A4C):   43.0 ml 20.83 ml/m LA Biplane Vol: 45.4 ml 21.99 ml/m  AORTIC VALVE LVOT Vmax:   152.00 cm/s LVOT Vmean:  90.700 cm/s LVOT VTI:    0.299 m  AORTA Ao Root diam: 2.80 cm MITRAL VALVE MV Area (PHT): 3.48 cm    SHUNTS MV Decel Time: 218 msec    Systemic VTI:  0.30 m MV E velocity: 92.80 cm/s  Systemic Diam: 2.00 cm MV A velocity: 88.60 cm/s MV E/A ratio:  1.05 Dorris Carnes MD Electronically signed by Dorris Carnes MD Signature Date/Time: 03/04/2022/3:48:18 PM    Final    CARDIAC CATHETERIZATION  Result Date: 03/03/2022 Images from the original result were not included.   The left  ventricular systolic function is normal.   LV end diastolic pressure is moderately elevated.   The left ventricular ejection fraction is 55-65% by visual estimate. Fernande Ortego is a 51 y.o. female  [id number] LOCATION:  FACILITY: [facility] PHYSICIAN: Quay Burow, M.D. 09-07-1970 DATE OF PROCEDURE:  03/03/2022 DATE OF DISCHARGE: CARDIAC CATHETERIZATION History obtained from chart review.Jnyla Jordan is a 51 y.o. female with history of CAD, hypertension, hyperlipidemia, stroke/TIA, fibromyalgia/chronic pain syndrome, possible atrial fibrillation (CVA in 2018 with questionable atrial fibrillation at that time, on Eliquis for few months then discontinued) who is being seen 03/03/2022 for the evaluation of chest pain.  Her EKG was unchanged.  Her troponins rose to 21,000.  She was referred for diagnostic coronary angiography.  She did have a heart catheterization performed in February this year practicality that showed normal coronary arteries. PROCEDURE DESCRIPTION: The patient was brought to the second floor [facility] cath lab in the postabsorptive state.  She was premedicated with IV Versed and fentanyl.  In addition, she got IV Solu-Medrol and Benadryl for contrast allergy prophylaxis.  Her right wrist and groin Were prepped and shaved in usual sterile fashion. Xylocaine 1% was used for local anesthesia. A 5 French sheath was inserted into the right common femoral artery using standard Seldinger technique.  I attempted right radial access unsuccessfully despite using ultrasound guidance.  5 French right and left Judkins diagnostic catheters were used for selective coronary angiography and left ventriculography respectively.  Isovue dye is used for the entirety of the case (40 cc of contrast total to patient).  Retroaortic, left ventricular pullback pressures were recorded.  LVEDP was measured at 29.  I did perform a right common femoral angiogram but I think the the femoral stick was  slightly high and  therefore I elected not to perform Mynx closure.   Ms. Hoelzle has normal coronary arteries as she did back in February of this year.  Her LV function was normal.  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  The sheath was removed and pressure held in the groin to achieve hemostasis.  The patient left lab in stable condition.  She would benefit from being started on a low-dose calcium channel blocker. Quay Burow. MD, [facility] 03/03/2022 3:46 PM    ECHOCARDIOGRAM COMPLETE  Result Date: 03/03/2022    ECHOCARDIOGRAM REPORT   Patient Name:   ANTONIETA PARAMO Date of Exam: 03/03/2022 Medical Rec #:  [id number]     Height:       67.0 in Accession #:    [id number]    Weight:       210.0 lb Date of Birth:  [birth year]     BSA:          2.065 m Patient Age:    43 years      BP:           134/82 mmHg Patient Gender: F             HR:           56 bpm. Exam Location:  Inpatient Procedure: 2D Echo, Strain Analysis and Intracardiac Opacification Agent Indications:    chets pain and elevated troponin  History:        Patient has prior history of Echocardiogram examinations, most                 recent 06/21/2021. CAD, Stroke; Signs/Symptoms:Chest Pain.  Sonographer:    Harvie Junior Referring Phys: [id number] [place]  1. Left ventricular ejection fraction, by estimation, is 60 to 65%. The left ventricle has normal function. The left ventricle has no regional wall motion abnormalities. Left ventricular diastolic parameters were normal. The average left ventricular global longitudinal strain is -22.5 %. The global longitudinal strain is normal.  2. Right ventricular systolic function is normal. The right ventricular size is normal. Tricuspid regurgitation signal is inadequate for assessing PA pressure.  3. The mitral valve is normal in structure. Trivial mitral valve regurgitation. No evidence of mitral stenosis.  4. The aortic valve has an indeterminant number of cusps. Aortic valve regurgitation is  not visualized. No aortic stenosis is present.  5. The inferior vena cava is normal in size with greater than 50% respiratory variability, suggesting right atrial pressure of 3 mmHg. Comparison(s): No significant change from prior study. FINDINGS  Left Ventricle: Left ventricular ejection fraction, by estimation, is 60 to 65%. The left ventricle has normal function. The left ventricle has no regional wall motion abnormalities. Definity contrast agent was given IV to delineate the left ventricular  endocardial borders. The average left ventricular global longitudinal strain is -22.5 %. The global longitudinal strain is normal. The left ventricular internal cavity size was normal in size. There is no left ventricular hypertrophy. Left ventricular diastolic parameters were normal. Right Ventricle: The right ventricular size is normal. Right ventricular systolic function is normal. Tricuspid regurgitation signal is inadequate for assessing PA pressure. The tricuspid regurgitant velocity is 1.64 m/s, and with an assumed right atrial  pressure of 3 mmHg, the estimated right ventricular systolic pressure is [id number] mmHg. Left Atrium: Left atrial size was normal in size. Right Atrium: Right atrial size was normal in size. Pericardium: There is no evidence of pericardial effusion. Mitral Valve:  The mitral valve is normal in structure. Trivial mitral valve regurgitation. No evidence of mitral valve stenosis. Tricuspid Valve: The tricuspid valve is normal in structure. Tricuspid valve regurgitation is trivial. No evidence of tricuspid stenosis. Aortic Valve: The aortic valve has an indeterminant number of cusps. Aortic valve regurgitation is not visualized. No aortic stenosis is present. Aortic valve mean gradient measures 3.0 mmHg. Aortic valve peak gradient measures 6.7 mmHg. Aortic valve area, by VTI measures 2.52 cm. Pulmonic Valve: The pulmonic valve was normal in structure. Pulmonic valve regurgitation is not visualized.  No evidence of pulmonic stenosis. Aorta: The aortic root is normal in size and structure. Venous: The inferior vena cava is normal in size with greater than 50% respiratory variability, suggesting right atrial pressure of 3 mmHg. IAS/Shunts: No atrial level shunt detected by color flow Doppler.  LEFT VENTRICLE PLAX 2D LVIDd:         4.80 cm      Diastology LVIDs:         3.30 cm      LV e' medial:    7.83 cm/s LV PW:         0.90 cm      LV E/e' medial:  12.7 LV IVS:        0.90 cm      LV e' lateral:   9.68 cm/s LVOT diam:     1.90 cm      LV E/e' lateral: 10.3 LV SV:         76 LV SV Index:   37           2D Longitudinal Strain LVOT Area:     2.84 cm     2D Strain GLS Avg:     -22.5 %  LV Volumes (MOD) LV vol d, MOD A2C: 127.0 ml LV vol d, MOD A4C: 109.0 ml LV vol s, MOD A2C: 55.0 ml LV vol s, MOD A4C: 54.5 ml LV SV MOD A2C:     72.0 ml LV SV MOD A4C:     109.0 ml LV SV MOD BP:      64.7 ml RIGHT VENTRICLE RV Basal diam:  3.50 cm RV Mid diam:    3.00 cm RV S prime:     13.80 cm/s TAPSE (M-mode): 3.2 cm LEFT ATRIUM             Index        RIGHT ATRIUM           Index LA diam:        3.10 cm 1.50 cm/m   RA Area:     11.40 cm LA Vol (A2C):   92.7 ml 44.90 ml/m  RA Volume:   24.70 ml  11.96 ml/m LA Vol (A4C):   52.0 ml 25.19 ml/m LA Biplane Vol: 77.2 ml 37.39 ml/m  AORTIC VALVE                    PULMONIC VALVE AV Area (Vmax):    2.44 cm     PV Vmax:       0.84 m/s AV Area (Vmean):   2.94 cm     PV Peak grad:  2.8 mmHg AV Area (VTI):     2.52 cm AV Vmax:           129.00 cm/s AV Vmean:          82.400 cm/s AV VTI:  0.302 m AV Peak Grad:      6.7 mmHg AV Mean Grad:      3.0 mmHg LVOT Vmax:         111.00 cm/s LVOT Vmean:        85.500 cm/s LVOT VTI:          0.268 m LVOT/AV VTI ratio: 0.89  AORTA Ao Root diam: 3.20 cm Ao Asc diam:  3.30 cm MITRAL VALVE                TRICUSPID VALVE MV Area (PHT): 3.91 cm     TR Peak grad:   10.8 mmHg MV Decel Time: 194 msec     TR Vmax:        164.00 cm/s MR Peak  grad: 40.4 mmHg MR Vmax:      317.67 cm/s   SHUNTS MV E velocity: 99.40 cm/s   Systemic VTI:  0.27 m MV A velocity: 108.00 cm/s  Systemic Diam: 1.90 cm MV E/A ratio:  0.92 Olga Millers MD Electronically signed by Olga Millers MD Signature Date/Time: 03/03/2022/12:10:05 PM    Final    DG Chest 2 View  Result Date: 03/03/2022 CLINICAL DATA:  Chest pain. EXAM: CHEST - 2 VIEW COMPARISON:  06/21/2021 FINDINGS: The lungs are clear without focal pneumonia, edema, pneumothorax or pleural effusion. Bibasilar atelectasis noted. The cardiopericardial silhouette is within normal limits for size. The visualized bony structures of the thorax are unremarkable. IMPRESSION: Bibasilar atelectasis.  Otherwise no acute cardiopulmonary findings. Electronically Signed   By: Kennith Center M.D.   On: 03/03/2022 06:57   Disposition   Pt is being discharged home today in good condition.  Follow-up Plans & Appointments     Follow-up Information     Beatrice Lecher, PA-C Follow up on 03/21/2022.   Specialties: Cardiology, Physician Assistant Why: @ 10:55am Contact information: [address] [place] [address] [phone]         [facility] office. Go on 03/14/2022.   Why: @ 2pm for repeat labs. You can realy go anytime this day between 8am and 4pm. Contact information: [address],  [place], [place]  [address]                  Discharge Medications   Allergies as of 03/05/2022       Reactions   Ivp Dye [iodinated Contrast Media] Hives   Lipitor [atorvastatin] Other (See Comments)   Myalgias   Penicillins Other (See Comments)   Childhood allergy Unknown reaction        Medication List     TAKE these medications    acetaminophen 650 MG CR tablet Commonly known as: TYLENOL Take 1,300 mg by mouth every 8 (eight) hours as needed for pain.   acetaminophen 500 MG tablet Commonly known as: TYLENOL Take 1,000 mg by mouth daily as needed for  headache.   Alka-Seltzer Heartburn 750 MG chewable tablet Generic drug: calcium carbonate Chew 1,500 mg by mouth daily as needed for heartburn.   amLODipine 5 MG tablet Commonly known as: NORVASC Take 1 tablet (5 mg total) by mouth daily. Start taking on: March 06, 2022   clopidogrel 75 MG tablet Commonly known as: PLAVIX Take 75 mg by mouth at bedtime.   hydrOXYzine 25 MG capsule Commonly known as: VISTARIL Take 50 mg by mouth at bedtime.   metoprolol succinate 25 MG 24 hr tablet Commonly known as: Toprol XL Take 1 tablet (25 mg  total) by mouth daily. What changed: when to take this   nitroGLYCERIN 0.4 MG SL tablet Commonly known as: NITROSTAT Place 1 tablet (0.4 mg total) under the tongue every 5 (five) minutes as needed for chest pain.   nortriptyline 25 MG capsule Commonly known as: PAMELOR Take 50 mg by mouth at bedtime.   oxyCODONE 15 MG immediate release tablet Commonly known as: ROXICODONE Take 15 mg by mouth 3 (three) times daily as needed for pain.   pantoprazole 40 MG tablet Commonly known as: PROTONIX TAKE 1 TABLET BY MOUTH DAILY(FURTHER REFILLS BY PCP) What changed: See the new instructions.   rosuvastatin 5 MG tablet Commonly known as: CRESTOR Take 1 tablet (5 mg total) by mouth daily.           Outstanding Labs/Studies   BMET  Duration of Discharge Encounter   Greater than 30 minutes including physician time.  Signed, Angelena Form, PA-C 03/05/2022, 11:37 AM

## 2022-03-05 NOTE — Progress Notes (Signed)
Rounding Note    Patient Name: Paige Woodward Date of Encounter: 03/05/2022  [facility] Cardiologist: Kristeen Miss, MD   Subjective   Denies any chest pain.  BP 120/79  Inpatient Medications    Scheduled Meds:  amLODipine  5 mg Oral Daily   aspirin EC  81 mg Oral Daily   enoxaparin (LOVENOX) injection  40 mg Subcutaneous Q24H   hydrOXYzine  50 mg Oral QHS   metoprolol succinate  25 mg Oral QHS   nortriptyline  50 mg Oral QHS   potassium chloride  40 mEq Oral Once   rosuvastatin  5 mg Oral Daily   sodium chloride flush  3 mL Intravenous Q12H   Continuous Infusions:  sodium chloride     PRN Meds: sodium chloride, acetaminophen, acetaminophen, morphine injection, nitroGLYCERIN, ondansetron (ZOFRAN) IV, ondansetron (ZOFRAN) IV, oxyCODONE, pantoprazole, sodium chloride flush   Vital Signs    Vitals:   03/04/22 1617 03/04/22 1933 03/04/22 2333 03/05/22 0351  BP: 123/72 126/76 (!) 146/84 120/79  Pulse:  80 75 66  Resp: Temp: 98.1 F (36.7 C) 98.4 F (36.9 C) 98.1 F (36.7 C) 98.1 F (36.7 C)  TempSrc:  Oral Oral Oral  SpO2:  96% 98% 99%  Weight:      Height:       No intake or output data in the 24 hours ending 03/05/22 0803     03/03/2022    6:18 AM 08/10/2021   10:00 AM 06/21/2021    6:16 AM  Last 3 Weights  Weight (lbs) 210 lb 211 lb 14.4 oz 202 lb 13.2 oz  Weight (kg) 95.255 kg 96.117 kg 92 kg      Telemetry    NSR - Personally Reviewed  ECG    Normal sinus rhythm, rate 64, T wave inversion in lead III- Personally Reviewed  Physical Exam   GEN: No acute distress.   Neck: No JVD Cardiac: RRR, no murmurs, rubs, or gallops.  Respiratory: Clear to auscultation bilaterally. GI: Soft, nontender, non-distended  MS: No edema; No deformity. Neuro:  Nonfocal  Psych: Normal affect   Labs    High Sensitivity Troponin:   Recent Labs  Lab 03/03/22 0645 03/03/22 1113  TROPONINIHS 3,793* 21,343*      Chemistry Recent Labs   Lab 03/03/22 0645 03/04/22 0015 03/05/22 0029  NA 138 136 138  K 4.0 3.7 3.2*  CL 103 104 108  CO2 GLUCOSE 140* 153* 100*  BUN CREATININE 1.07* 0.99 1.13*  CALCIUM 9.9 9.7 8.9  GFRNONAA >60 >60 59*  ANIONGAP Lipids No results for input(s): "CHOL", "TRIG", "HDL", "LABVLDL", "LDLCALC", "CHOLHDL" in the last 168 hours.  Hematology Recent Labs  Lab 03/03/22 0645 03/04/22 0015  WBC 15.8* 15.4*  RBC 3.97 4.31  HGB 12.2 13.5  HCT 34.4* 36.1  MCV 86.6 83.8  MCH 30.7 31.3  MCHC 35.5 37.4*  RDW 14.2 14.3  PLT 516* 493*    Thyroid No results for input(s): "TSH", "FREET4" in the last 168 hours.  BNPNo results for input(s): "BNP", "PROBNP" in the last 168 hours.  DDimer  Recent Labs  Lab 03/03/22 1113  DDIMER 0.53*      Radiology    VAS [place] LOWER EXTREMITY VENOUS (DVT)  Result Date: 03/04/2022  Lower Venous DVT Study Patient Name:  Paige Woodward  Date of Exam:   03/04/2022 Medical Rec #:  [id number]      Accession #:    [id number] Date of Birth: [birth year]      Patient Gender: F Patient Age:   [age] years [facility] Procedure:      VAS US LOWER EXTREMITY VENOUS (DVT) Referring Phys: Epifanio LeschesHRISTOPHER Guerino Caporale --------------------------------------------------------------------------------  Indications: Edema.  Comparison Study: No prior study Performing Technologist: Gertie FeyMichelle Simonetti MHA, RDMS, RVT, RDCS  Examination Guidelines: A complete evaluation includes B-mode imaging, spectral Doppler, color Doppler, and power Doppler as needed of all accessible portions of each vessel. Bilateral testing is considered an integral part of a complete examination. Limited examinations for reoccurring indications may be performed as noted. The reflux portion of the exam is performed with the patient in reverse Trendelenburg.  +---------+---------------+---------+-----------+---------------+--------------+ RIGHT     CompressibilityPhasicitySpontaneityProperties     Thrombus Aging +---------+---------------+---------+-----------+---------------+--------------+ CFV      Full           Yes      Yes                                      +---------+---------------+---------+-----------+---------------+--------------+ SFJ      Full                                                             +---------+---------------+---------+-----------+---------------+--------------+ FV Prox  Full                                                             +---------+---------------+---------+-----------+---------------+--------------+ FV Mid   Full                                                             +---------+---------------+---------+-----------+---------------+--------------+ FV DistalFull                                                             +---------+---------------+---------+-----------+---------------+--------------+ PFV      Full                                                             +---------+---------------+---------+-----------+---------------+--------------+ POP      Full           Yes      Yes                                      +---------+---------------+---------+-----------+---------------+--------------+ PTV  Yes        patent by color                                                           Doppler                       +---------+---------------+---------+-----------+---------------+--------------+ PERO                             Yes        patent by color                                                           Doppler                       +---------+---------------+---------+-----------+---------------+--------------+   +---------+---------------+---------+-----------+----------+--------------+ LEFT     CompressibilityPhasicitySpontaneityPropertiesThrombus Aging  +---------+---------------+---------+-----------+----------+--------------+ CFV      Full           Yes      Yes                                 +---------+---------------+---------+-----------+----------+--------------+ SFJ      Full                                                        +---------+---------------+---------+-----------+----------+--------------+ FV Prox  Full                                                        +---------+---------------+---------+-----------+----------+--------------+ FV Mid   Full                                                        +---------+---------------+---------+-----------+----------+--------------+ FV DistalFull                                                        +---------+---------------+---------+-----------+----------+--------------+ PFV      Full                                                        +---------+---------------+---------+-----------+----------+--------------+ POP      Full  Yes      Yes                                 +---------+---------------+---------+-----------+----------+--------------+ PTV      Full                                                        +---------+---------------+---------+-----------+----------+--------------+ PERO     Full                                                        +---------+---------------+---------+-----------+----------+--------------+     Summary: RIGHT: - There is no evidence of deep vein thrombosis in the lower extremity. However, portions of this examination were limited- see technologist comments above.  - No cystic structure found in the popliteal fossa.  LEFT: - There is no evidence of deep vein thrombosis in the lower extremity.  - No cystic structure found in the popliteal fossa.  *See table(s) above for measurements and observations. Electronically signed by Lemar Livings MD on 03/04/2022 at 5:28:47 PM.    Final    MR  CARDIAC VELOCITY FLOW MAP  Result Date: 03/04/2022 CLINICAL DATA:  4F with MINOCA EXAM: CARDIAC MRI TECHNIQUE: The patient was scanned on a 1.5 Tesla Siemens magnet. A dedicated cardiac coil was used. Functional imaging was done using Fiesta sequences. 2,3, and 4 chamber views were done to assess for RWMA's. Modified Simpson's rule using a short axis stack was used to calculate an ejection fraction on a dedicated work [employer], [employer]. The patient received 9.5 cc of Gadavist. After 10 minutes inversion recovery sequences were used to assess for infiltration and scar tissue. Phase contrast velocity mapping was performed. CONTRAST:  9.5 cc  of Gadavist FINDINGS: Left ventricle: -Normal wall thickness -Normal global systolic function with focal region of hypokinesis in basal inferior wall -Normal ECV (28%) -Normal T2 values -Subendocardial LGE in focal region of basal inferior wall consistent with infarct. LV EF:  59% (Normal 56-78%) Absolute volumes: LV EDV: (Normal 52-141 mL) LV ESV: 4mL (Normal 13-51 mL) LV SV: (Normal 33-97 mL) CO: 6.6L/min (Normal 2.7-6.0 L/min) Indexed volumes: LV EDV: 50mL/sq-m (Normal 41-81 mL/sq-m) LV ESV: 74mL/sq-m (Normal 12-21 mL/sq-m) LV SV: 41mL/sq-m (Normal 26-56 mL/sq-m) CI: 3.1L/min/sq-m (Normal 1.8-3.8 L/min/sq-m) Right ventricle: Normal size and systolic function RV EF: 63% (Normal 47-80%) Absolute volumes: RV EDV: (Normal 58-154 mL) RV ESV: 75mL (Normal 12-68 mL) RV SV: (Normal 35-98 mL) CO: 6.5L/min (Normal 2.7-6 L/min) Indexed volumes: RV EDV: 53mL/sq-m (Normal 48-87 mL/sq-m) RV ESV: 11mL/sq-m (Normal 11-28 mL/sq-m) RV SV: 37mL/sq-m (Normal 27-57 mL/sq-m) CI: 3.1L/min/sq-m (Normal 1.8-3.8 L/min/sq-m) Left atrium: Mild enlargement Right atrium: Mild enlargement Mitral valve: No regurgitation Aortic valve: Tricuspid. Trivial regurgitation Tricuspid valve: Trivial regurgitation Pulmonic valve: No regurgitation Aorta: Mild dilatation of  ascending aorta measuring 17mm Pericardium: Normal IMPRESSION: 1. Subendocardial late gadolinium enhancement in focal region of basal inferior wall consistent with infarct. 2.  No evidence of myocarditis 3. Normal LV size, wall thickness, and global systolic function (EF 59%). Focal region of hypokinesis in basal inferior  wall 4. Normal RV size and systolic function (EF 63%) Electronically Signed   By: Epifanio Lesches M.D.   On: 03/04/2022 16:01   MR CARDIAC MORPHOLOGY W WO CONTRAST  Result Date: 03/04/2022 CLINICAL DATA:  19F with MINOCA EXAM: CARDIAC MRI TECHNIQUE: The patient was scanned on a 1.5 Tesla Siemens magnet. A dedicated cardiac coil was used. Functional imaging was done using Fiesta sequences. 2,3, and 4 chamber views were done to assess for RWMA's. Modified Simpson's rule using a short axis stack was used to calculate an ejection fraction on a dedicated work [employer] officer, [employer]. The patient received 9.5 cc of Gadavist. After 10 minutes inversion recovery sequences were used to assess for infiltration and scar tissue. Phase contrast velocity mapping was performed. CONTRAST:  9.5 cc  of Gadavist FINDINGS: Left ventricle: -Normal wall thickness -Normal global systolic function with focal region of hypokinesis in basal inferior wall -Normal ECV (28%) -Normal T2 values -Subendocardial LGE in focal region of basal inferior wall consistent with infarct. LV EF:  59% (Normal 56-78%) Absolute volumes: LV EDV: (Normal 52-141 mL) LV ESV: 72mL (Normal 13-51 mL) LV SV: (Normal 33-97 mL) CO: 6.6L/min (Normal 2.7-6.0 L/min) Indexed volumes: LV EDV: 8mL/sq-m (Normal 41-81 mL/sq-m) LV ESV: 71mL/sq-m (Normal 12-21 mL/sq-m) LV SV: 39mL/sq-m (Normal 26-56 mL/sq-m) CI: 3.1L/min/sq-m (Normal 1.8-3.8 L/min/sq-m) Right ventricle: Normal size and systolic function RV EF: 63% (Normal 47-80%) Absolute volumes: RV EDV: (Normal 58-154 mL) RV ESV: 58mL (Normal 12-68 mL) RV SV: (Normal  35-98 mL) CO: 6.5L/min (Normal 2.7-6 L/min) Indexed volumes: RV EDV: 3mL/sq-m (Normal 48-87 mL/sq-m) RV ESV: 52mL/sq-m (Normal 11-28 mL/sq-m) RV SV: 22mL/sq-m (Normal 27-57 mL/sq-m) CI: 3.1L/min/sq-m (Normal 1.8-3.8 L/min/sq-m) Left atrium: Mild enlargement Right atrium: Mild enlargement Mitral valve: No regurgitation Aortic valve: Tricuspid. Trivial regurgitation Tricuspid valve: Trivial regurgitation Pulmonic valve: No regurgitation Aorta: Mild dilatation of ascending aorta measuring 38mm Pericardium: Normal IMPRESSION: 1. Subendocardial late gadolinium enhancement in focal region of basal inferior wall consistent with infarct. 2.  No evidence of myocarditis 3. Normal LV size, wall thickness, and global systolic function (EF 59%). Focal region of hypokinesis in basal inferior wall 4.  Normal RV size and systolic function (EF 63%) Electronically Signed   By: Epifanio Lesches M.D.   On: 03/04/2022 16:01   ECHOCARDIOGRAM COMPLETE  Result Date: 03/04/2022    ECHOCARDIOGRAM REPORT   Patient Name:   Paige Woodward Date of Exam: 03/04/2022 Medical Rec #:  [id number]     Height:       67.0 in Accession #:    [id number]    Weight:       210.0 lb Date of Birth:  1970/06/12     BSA:          2.065 m Patient Age:    51 years      BP:           155/86 mmHg Patient Gender: F             HR:           86 bpm. Exam Location:  Inpatient Procedure: 2D Echo, 3D Echo, Cardiac Doppler and Color Doppler Indications:    NSTEMI I21.4  History:        Patient has prior history of Echocardiogram examinations, most                 recent 03/03/2022. Previous Myocardial Infarction; Risk  Factors:Hypertension. GERD.  Sonographer:    Darlina Sicilian RDCS Referring Phys: Pearletha Forge BERRY IMPRESSIONS  1. Left ventricular ejection fraction, by estimation, is 65 to 70%. The left ventricle has normal function. The left ventricle has no regional wall motion abnormalities. Left ventricular diastolic parameters were normal.  2.  Right ventricular systolic function is normal. The right ventricular size is normal.  3. MR is eccentric, directed anterior into LA. Mild mitral valve regurgitation.  4. The aortic valve is normal in structure. Aortic valve regurgitation is not visualized. FINDINGS  Left Ventricle: Left ventricular ejection fraction, by estimation, is 65 to 70%. The left ventricle has normal function. The left ventricle has no regional wall motion abnormalities. The left ventricular internal cavity size was normal in size. There is  no left ventricular hypertrophy. Left ventricular diastolic parameters were normal. Right Ventricle: The right ventricular size is normal. Right vetricular wall thickness was not assessed. Right ventricular systolic function is normal. Left Atrium: Left atrial size was normal in size. Right Atrium: Right atrial size was normal in size. Pericardium: There is no evidence of pericardial effusion. Mitral Valve: MR is eccentric, directed anterior into LA. Mild mitral valve regurgitation. Tricuspid Valve: The tricuspid valve is normal in structure. Tricuspid valve regurgitation is trivial. Aortic Valve: The aortic valve is normal in structure. Aortic valve regurgitation is not visualized. Pulmonic Valve: The pulmonic valve was grossly normal. Pulmonic valve regurgitation is not visualized. Aorta: The aortic root is normal in size and structure. IAS/Shunts: No atrial level shunt detected by color flow Doppler.  LEFT VENTRICLE PLAX 2D LVIDd:         4.80 cm   Diastology LVIDs:         2.80 cm   LV e' medial:    7.30 cm/s LV PW:         0.90 cm   LV E/e' medial:  12.7 LV IVS:        0.90 cm   LV e' lateral:   9.86 cm/s LVOT diam:     2.00 cm   LV E/e' lateral: 9.4 LV SV:         94 LV SV Index:   45 LVOT Area:     3.14 cm                           3D Volume EF:                          3D EF:        61 %                          LV EDV:       135 ml                          LV ESV:       52 ml                           LV SV:        82 ml RIGHT VENTRICLE RV S prime:     18.60 cm/s TAPSE (M-mode): 3.1 cm LEFT ATRIUM             Index        RIGHT ATRIUM  Index LA diam:        4.00 cm 1.94 cm/m   RA Area:     13.20 cm LA Vol (A2C):   47.8 ml 23.15 ml/m  RA Volume:   28.30 ml  13.71 ml/m LA Vol (A4C):   43.0 ml 20.83 ml/m LA Biplane Vol: 45.4 ml 21.99 ml/m  AORTIC VALVE LVOT Vmax:   152.00 cm/s LVOT Vmean:  90.700 cm/s LVOT VTI:    0.299 m  AORTA Ao Root diam: 2.80 cm MITRAL VALVE MV Area (PHT): 3.48 cm    SHUNTS MV Decel Time: 218 msec    Systemic VTI:  0.30 m MV E velocity: 92.80 cm/s  Systemic Diam: 2.00 cm MV A velocity: 88.60 cm/s MV E/A ratio:  1.05 Dietrich Pates MD Electronically signed by Dietrich Pates MD Signature Date/Time: 03/04/2022/3:48:18 PM    Final    CARDIAC CATHETERIZATION  Result Date: 03/03/2022 Images from the original result were not included.   The left ventricular systolic function is normal.   LV end diastolic pressure is moderately elevated.   The left ventricular ejection fraction is 55-65% by visual estimate. Paige Woodward is a 51 y.o. female  [id number] LOCATION:  FACILITY: [facility] PHYSICIAN: Nanetta Batty, M.D. 03-05-1971 DATE OF PROCEDURE:  03/03/2022 DATE OF DISCHARGE: CARDIAC CATHETERIZATION History obtained from chart review.Paige Woodward is a 51 y.o. female with history of CAD, hypertension, hyperlipidemia, stroke/TIA, fibromyalgia/chronic pain syndrome, possible atrial fibrillation (CVA in 2018 with questionable atrial fibrillation at that time, on Eliquis for few months then discontinued) who is being seen 03/03/2022 for the evaluation of chest pain.  Her EKG was unchanged.  Her troponins rose to 21,000.  She was referred for diagnostic coronary angiography.  She did have a heart catheterization performed in February this year practicality that showed normal coronary arteries. PROCEDURE DESCRIPTION: The patient was brought to the second floor [facility] cath lab in the  postabsorptive state.  She was premedicated with IV Versed and fentanyl.  In addition, she got IV Solu-Medrol and Benadryl for contrast allergy prophylaxis.  Her right wrist and groin Were prepped and shaved in usual sterile fashion. Xylocaine 1% was used for local anesthesia. A 5 French sheath was inserted into the right common femoral artery using standard Seldinger technique.  I attempted right radial access unsuccessfully despite using ultrasound guidance.  5 French right and left Judkins diagnostic catheters were used for selective coronary angiography and left ventriculography respectively.  Isovue dye is used for the entirety of the case (40 cc of contrast total to patient).  Retroaortic, left ventricular pullback pressures were recorded.  LVEDP was measured at 29.  I did perform a right common femoral angiogram but I think the the femoral stick was slightly high and therefore I elected not to perform Mynx closure.   Ms. Raineri has normal coronary arteries as she did back in February of this year.  Her LV function was normal.  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  The sheath was removed and pressure held in the groin to achieve hemostasis.  The patient left lab in stable condition.  She would benefit from being started on a low-dose calcium channel blocker. Nanetta Batty. MD, [facility] 03/03/2022 3:46 PM    ECHOCARDIOGRAM COMPLETE  Result Date: 03/03/2022    ECHOCARDIOGRAM REPORT   Patient Name:   Paige Woodward Date of Exam: 03/03/2022 Medical Rec #:  [id number]     Height:       67.0 in  Accession #:    [id number]    Weight:       210.0 lb Date of Birth:  1971-03-06     BSA:          2.065 m Patient Age:    51 years      BP:           134/82 mmHg Patient Gender: F             HR:           56 bpm. Exam Location:  Inpatient Procedure: 2D Echo, Strain Analysis and Intracardiac Opacification Agent Indications:    chets pain and elevated troponin  History:        Patient has prior history of  Echocardiogram examinations, most                 recent 06/21/2021. CAD, Stroke; Signs/Symptoms:Chest Pain.  Sonographer:    Cathie Hoops Referring Phys: [id number] Jonita Albee IMPRESSIONS  1. Left ventricular ejection fraction, by estimation, is 60 to 65%. The left ventricle has normal function. The left ventricle has no regional wall motion abnormalities. Left ventricular diastolic parameters were normal. The average left ventricular global longitudinal strain is -22.5 %. The global longitudinal strain is normal.  2. Right ventricular systolic function is normal. The right ventricular size is normal. Tricuspid regurgitation signal is inadequate for assessing PA pressure.  3. The mitral valve is normal in structure. Trivial mitral valve regurgitation. No evidence of mitral stenosis.  4. The aortic valve has an indeterminant number of cusps. Aortic valve regurgitation is not visualized. No aortic stenosis is present.  5. The inferior vena cava is normal in size with greater than 50% respiratory variability, suggesting right atrial pressure of 3 mmHg. Comparison(s): No significant change from prior study. FINDINGS  Left Ventricle: Left ventricular ejection fraction, by estimation, is 60 to 65%. The left ventricle has normal function. The left ventricle has no regional wall motion abnormalities. Definity contrast agent was given IV to delineate the left ventricular  endocardial borders. The average left ventricular global longitudinal strain is -22.5 %. The global longitudinal strain is normal. The left ventricular internal cavity size was normal in size. There is no left ventricular hypertrophy. Left ventricular diastolic parameters were normal. Right Ventricle: The right ventricular size is normal. Right ventricular systolic function is normal. Tricuspid regurgitation signal is inadequate for assessing PA pressure. The tricuspid regurgitant velocity is 1.64 m/s, and with an assumed right atrial  pressure of 3  mmHg, the estimated right ventricular systolic pressure is 13.8 mmHg. Left Atrium: Left atrial size was normal in size. Right Atrium: Right atrial size was normal in size. Pericardium: There is no evidence of pericardial effusion. Mitral Valve: The mitral valve is normal in structure. Trivial mitral valve regurgitation. No evidence of mitral valve stenosis. Tricuspid Valve: The tricuspid valve is normal in structure. Tricuspid valve regurgitation is trivial. No evidence of tricuspid stenosis. Aortic Valve: The aortic valve has an indeterminant number of cusps. Aortic valve regurgitation is not visualized. No aortic stenosis is present. Aortic valve mean gradient measures 3.0 mmHg. Aortic valve peak gradient measures 6.7 mmHg. Aortic valve area, by VTI measures 2.52 cm. Pulmonic Valve: The pulmonic valve was normal in structure. Pulmonic valve regurgitation is not visualized. No evidence of pulmonic stenosis. Aorta: The aortic root is normal in size and structure. Venous: The inferior vena cava is normal in size with greater than 50% respiratory variability, suggesting right atrial pressure of  3 mmHg. IAS/Shunts: No atrial level shunt detected by color flow Doppler.  LEFT VENTRICLE PLAX 2D LVIDd:         4.80 cm      Diastology LVIDs:         3.30 cm      LV e' medial:    7.83 cm/s LV PW:         0.90 cm      LV E/e' medial:  12.7 LV IVS:        0.90 cm      LV e' lateral:   9.68 cm/s LVOT diam:     1.90 cm      LV E/e' lateral: 10.3 LV SV:         76 LV SV Index:   37           2D Longitudinal Strain LVOT Area:     2.84 cm     2D Strain GLS Avg:     -22.5 %  LV Volumes (MOD) LV vol d, MOD A2C: 127.0 ml LV vol d, MOD A4C: 109.0 ml LV vol s, MOD A2C: 55.0 ml LV vol s, MOD A4C: 54.5 ml LV SV MOD A2C:     72.0 ml LV SV MOD A4C:     109.0 ml LV SV MOD BP:      64.7 ml RIGHT VENTRICLE RV Basal diam:  3.50 cm RV Mid diam:    3.00 cm RV S prime:     13.80 cm/s TAPSE (M-mode): 3.2 cm LEFT ATRIUM             Index         RIGHT ATRIUM           Index LA diam:        3.10 cm 1.50 cm/m   RA Area:     11.40 cm LA Vol (A2C):   92.7 ml 44.90 ml/m  RA Volume:   24.70 ml  11.96 ml/m LA Vol (A4C):   52.0 ml 25.19 ml/m LA Biplane Vol: 77.2 ml 37.39 ml/m  AORTIC VALVE                    PULMONIC VALVE AV Area (Vmax):    2.44 cm     PV Vmax:       0.84 m/s AV Area (Vmean):   2.94 cm     PV Peak grad:  2.8 mmHg AV Area (VTI):     2.52 cm AV Vmax:           129.00 cm/s AV Vmean:          82.400 cm/s AV VTI:            0.302 m AV Peak Grad:      6.7 mmHg AV Mean Grad:      3.0 mmHg LVOT Vmax:         111.00 cm/s LVOT Vmean:        85.500 cm/s LVOT VTI:          0.268 m LVOT/AV VTI ratio: 0.89  AORTA Ao Root diam: 3.20 cm Ao Asc diam:  3.30 cm MITRAL VALVE                TRICUSPID VALVE MV Area (PHT): 3.91 cm     TR Peak grad:   10.8 mmHg MV Decel Time: 194 msec     TR Vmax:        164.00 cm/s  MR Peak grad: 40.4 mmHg MR Vmax:      317.67 cm/s   SHUNTS MV E velocity: 99.40 cm/s   Systemic VTI:  0.27 m MV A velocity: 108.00 cm/s  Systemic Diam: 1.90 cm MV E/A ratio:  0.92 Olga Millers MD Electronically signed by Olga Millers MD Signature Date/Time: 03/03/2022/12:10:05 PM    Final     Cardiac Studies     Patient Profile     51 y.o. female with history of CAD, hypertension, hyperlipidemia, stroke/TIA, fibromyalgia/chronic pain syndrome, possible atrial fibrillation (CVA in 2018 with questionable atrial fibrillation at that time, on Eliquis for few months then discontinued) who is being seen 03/03/2022 for the evaluation of chest pain  Assessment & Plan    Chest pain with elevated troponin: Presented with acute chest pain and troponin elevation over 21,000.  EKG with inferior T wave inversions.  Cath showed no CAD.  Interestingly had similar presentation in February 2023 (and also reports 2 prior episodes).  Cardiac MRI showed no evidence of myocarditis but did have basal inferior wall infarct; focal region of hypokinesis and  basal inferior wall but normal LVEF and RVEF.  Differential includes coronary vasospasm or embolic event.  Lower extremity duplex shows no DVT -Started amlodipine 5 mg daily for coronary vasospasm -Can discontinue plavix given normal coronaries, continue ASA -Continue toprol XL 25 mg daily -Plan Zio patch x2 weeks on discharge to evaluate for atrial fibrillation  Hypertension: Continue metoprolol, added amlodipine as above  Hyperlipidemia: Continue rosuvastatin 5 mg daily  Hypokalemia: K 3.2 this morning, unclear cause.  Repleted.  Would recheck BMET in 1 week  Disposition: Okay to discharge today, with plan for Zio patch x2 weeks as above.  Follow-up with Tereso Newcomer scheduled for 11/6   For questions or updates, please contact [facility] Please consult [email or web address] for contact info under        Signed, Little Ishikawa, MD  03/05/2022, 8:03 AM

## 2022-03-07 ENCOUNTER — Other Ambulatory Visit: Payer: Self-pay | Admitting: Physician Assistant

## 2022-03-07 ENCOUNTER — Ambulatory Visit: Payer: BC Managed Care – PPO | Attending: Physician Assistant

## 2022-03-07 ENCOUNTER — Telehealth: Payer: Self-pay | Admitting: Cardiovascular Disease

## 2022-03-07 DIAGNOSIS — R079 Chest pain, unspecified: Secondary | ICD-10-CM

## 2022-03-07 DIAGNOSIS — I4891 Unspecified atrial fibrillation: Secondary | ICD-10-CM

## 2022-03-07 NOTE — Progress Notes (Unsigned)
Enrolled for [employer] to mail a ZIO XT long term holter monitor to the patients address on file.   Dr. Nahser to read. 

## 2022-03-07 NOTE — Telephone Encounter (Signed)
After been in the hospital this week. Patient needs a letter saying its okay for her to return to work. Please advise

## 2022-03-07 NOTE — Telephone Encounter (Signed)
Returned call to pt to make her aware that her request for a return to work note has been sent to Angelena Form, PA-C, since she is the one that discharged her from the hospital, she would be the one that would do her note.  Pt was thankful for the return call and will await a response.

## 2022-03-14 ENCOUNTER — Other Ambulatory Visit: Payer: BC Managed Care – PPO

## 2022-03-15 DIAGNOSIS — R079 Chest pain, unspecified: Secondary | ICD-10-CM | POA: Diagnosis not present

## 2022-03-15 DIAGNOSIS — I4891 Unspecified atrial fibrillation: Secondary | ICD-10-CM

## 2022-03-20 NOTE — Progress Notes (Unsigned)
Cardiology Office Note:    Date:  03/21/2022   ID:  Paige NormanSonya Elicker, DOB June 07, 1970, MRN [id number]  PCP:  Norm SaltVanstory, Ashley N, PA   [facility] Providers Cardiologist:  Kristeen MissPhilip Nahser, MD     Referring MD: Norm SaltVanstory, Ashley N, PA   CC: Here for hospital follow up  History of Present Illness:    Paige Woodward is a 51 y.o. female with a hx of the following:  Hx of NSTEMI HTN HLD Hx of stroke in 2018 Fibromyalgia, chronic pain syndrome Possible A-fib GERD  History of stroke in 2018 with questionable A-fib at that time.  Was on Eliquis for few months and then was discontinued.  Presented to the ED on March 03, 2022 for evaluation of chest pain.  Previous heart cath this year was negative for significant CAD.  TTE showed normal EF without RWMA.  During this admission, troponin rose to 21,000.  EKG revealed T wave inversions in leads III and aVF.  Admitted with NSTEMI.  Chest x-ray unremarkable.  Left heart cath revealed normal coronary arteries, normal LV function.  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  Inflammatory markers negative.  Cardiac MRI was negative for myocarditis, but did have basal inferior wall infarct, focal region of hypokinesis and basal inferior wall but normal LVEF and RVEF.  Differential included coronary vasospasm or embolic event.  D-dimer was mildly elevated, but LE duplex was negative for DVT.  Started on amlodipine 5 mg daily for coronary vasospasm.  Given her history of CVA and her inability to tolerate aspirin, she was kept on Plavix at discharge.  Was given monitor at discharge to rule out A-fib.   Today she presents for hospital follow-up.  She states she is doing better since she left the hospital; however, she has had a couple instances of intermittent chest pain, but not as severe as when she was in the hospital. Says she will take Alka seltzer plus chews if this occurs and knows if CP doesn't go away, it's not related to her GERD. Before  hospitalization she had a couple episodes of her vision going. Says it felt like someone held something over her left eye and would occur for a few seconds at a time. Occurred twice before going to the hospital. Does see a neurologist and denies any recent stroke like symptoms. Does admit to swelling in her bilateral lower extremities and skin discoloration at times. Swelling is more in right LE than in left LE, chronic and occurred before she was started on Amlodipine. Has had to take Lasix in the past, but did not seem to help. Works as a full time caregiver. Does admit to tingling and numbness in bilateral lower extremities. Tolerating medications well. Denies any other questions or concerns.   Past Medical History:  Diagnosis Date   Fibromyalgia    GERD (gastroesophageal reflux disease)    Hypertension    MI (myocardial infarction) (HCC)    Ulcerative colitis (HCC)     Past Surgical History:  Procedure Laterality Date   ABDOMINAL HYSTERECTOMY     APPENDECTOMY     CARDIAC CATHETERIZATION     CHOLECYSTECTOMY     EYE SURGERY     LEFT HEART CATH AND CORONARY ANGIOGRAPHY N/A 06/22/2021   Procedure: LEFT HEART CATH AND CORONARY ANGIOGRAPHY;  Surgeon: Lennette BihariKelly, Thomas A, MD;  Location: [facility];  Service: Cardiovascular;  Laterality: N/A;   LEFT HEART CATH AND CORONARY ANGIOGRAPHY N/A 03/03/2022   Procedure: LEFT HEART  CATH AND CORONARY ANGIOGRAPHY;  Surgeon: Lorretta Harp, MD;  Location: [facility];  Service: Cardiovascular;  Laterality: N/A;   SPLENECTOMY      Current Medications: Current Meds  Medication Sig   acetaminophen (TYLENOL) 500 MG tablet Take 1,000 mg by mouth daily as needed for headache.   acetaminophen (TYLENOL) 650 MG CR tablet Take 1,300 mg by mouth every 8 (eight) hours as needed for pain.   amLODipine (NORVASC) 5 MG tablet Take 1 tablet (5 mg total) by mouth daily.   calcium carbonate (ALKA-SELTZER HEARTBURN) 750 MG chewable tablet Chew 1,500 mg by  mouth daily as needed for heartburn.   clopidogrel (PLAVIX) 75 MG tablet Take 75 mg by mouth at bedtime.   hydrOXYzine (VISTARIL) 25 MG capsule Take 50 mg by mouth at bedtime.   isosorbide mononitrate (IMDUR) 30 MG 24 hr tablet Take 0.5 tablets (15 mg total) by mouth daily.   metoprolol succinate (TOPROL XL) 25 MG 24 hr tablet Take 1 tablet (25 mg total) by mouth daily.   nitroGLYCERIN (NITROSTAT) 0.4 MG SL tablet Place 1 tablet (0.4 mg total) under the tongue every 5 (five) minutes as needed for chest pain.   nortriptyline (PAMELOR) 25 MG capsule Take 50 mg by mouth at bedtime.   oxyCODONE (ROXICODONE) 15 MG immediate release tablet Take 15 mg by mouth 3 (three) times daily as needed for pain.   pantoprazole (PROTONIX) 40 MG tablet TAKE 1 TABLET BY MOUTH DAILY(FURTHER REFILLS BY PCP)   rosuvastatin (CRESTOR) 10 MG tablet Take 1 tablet (10 mg total) by mouth daily.   [DISCONTINUED] rosuvastatin (CRESTOR) 5 MG tablet Take 1 tablet (5 mg total) by mouth daily.     Allergies:   Ivp dye [iodinated contrast media], Lipitor [atorvastatin], and Penicillins   Social History   Socioeconomic History   Marital status: Married    Spouse name: Not on file   Number of children: Not on file   Years of education: Not on file   Highest education level: Not on file  Occupational History   Not on file  Tobacco Use   Smoking status: Every Day    Types: Cigarettes   Smokeless tobacco: Never  Vaping Use   Vaping Use: Never used  Substance and Sexual Activity   Alcohol use: No   Drug use: No   Sexual activity: Not on file  Other Topics Concern   Not on file  Social History Narrative   Not on file   Social Determinants of Health   Financial Resource Strain: Medium Risk (08/04/2021)   Overall Financial Resource Strain (CARDIA)    Difficulty of Paying Living Expenses: Somewhat hard  Food Insecurity: No Food Insecurity (08/04/2021)   Hunger Vital Sign    Worried About Running Out of Food in the  Last Year: Never true    Ran Out of Food in the Last Year: Never true  Transportation Needs: No Transportation Needs (08/04/2021)   PRAPARE - Hydrologist (Medical): No    Lack of Transportation (Non-Medical): No  Physical Activity: Not on file  Stress: Not on file  Social Connections: Not on file     Family History: The patient's family history includes Stroke in her father.  ROS:   Review of Systems  Constitutional: Negative.   HENT: Negative.    Eyes:  Negative for blurred vision, double vision, photophobia, pain, discharge and redness.  Respiratory: Negative.    Cardiovascular:  Positive for chest  pain and leg swelling. Negative for palpitations, orthopnea, claudication and PND.       See HPI.   Gastrointestinal: Negative.   Genitourinary: Negative.   Musculoskeletal: Negative.   Skin: Negative.   Neurological: Negative.   Endo/Heme/Allergies: Negative.   Psychiatric/Behavioral: Negative.      Please see the history of present illness.    All other systems reviewed and are negative.  EKGs/Labs/Other Studies Reviewed:    The following studies were reviewed today:   EKG:  EKG is not ordered today.   Monitor is pending  Vascular Ultrasound lower extremity DVT on 03/04/22: RIGHT:  - There is no evidence of deep vein thrombosis in the lower extremity.  However, portions of this examination were limited- see technologist  comments above.  - No cystic structure found in the popliteal fossa.  LEFT:  - There is no evidence of deep vein thrombosis in the lower extremity.  - No cystic structure found in the popliteal fossa.  2D Echo on 03/04/22:  1. Left ventricular ejection fraction, by estimation, is 65 to 70%. The  left ventricle has normal function. The left ventricle has no regional  wall motion abnormalities. Left ventricular diastolic parameters were  normal.   2. Right ventricular systolic function is normal. The right ventricular   size is normal.   3. MR is eccentric, directed anterior into LA. Mild mitral valve  regurgitation.   4. The aortic valve is normal in structure. Aortic valve regurgitation is  not visualized.  cMRI on 03/04/22: 1. Subendocardial late gadolinium enhancement in focal region of basal inferior wall consistent with infarct.   2.  No evidence of myocarditis   3. Normal LV size, wall thickness, and global systolic function (EF [id number]). Focal region of hypokinesis in basal inferior wall   4. Normal RV size and systolic function (EF [id number])   Left heart cath and coronary angiography on 03/03/22: Ms. Bonvillain has normal coronary arteries as she did back in February of this year. Her LV function was normal. 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 The sheath was removed and pressure held in the groin to achieve hemostasis. The patient left lab in stable condition. She would benefit from being started on a low-dose calcium channel blocker.   Recent Labs: 06/21/2021: B Natriuretic Peptide 61.9; TSH 1.331 03/04/2022: Hemoglobin 13.5; Platelets 493 03/05/2022: BUN 13; Creatinine, Ser 1.13; Potassium 3.2; Sodium 138  Recent Lipid Panel    Component Value Date/Time   CHOL 180 06/22/2021 0614   TRIG 46 06/22/2021 0614   HDL 38 (L) 06/22/2021 0614   CHOLHDL 4.7 06/22/2021 0614   VLDL 9 06/22/2021 0614   LDLCALC 133 (H) 06/22/2021 0614        Physical Exam:    VS:  BP 116/78   Pulse 89   Ht 5\' 7"  (1.702 m)   Wt 213 lb 3.2 oz (96.7 kg)   SpO2 93%   BMI 33.39 kg/m     Wt Readings from Last 3 Encounters:  03/21/22 213 lb 3.2 oz (96.7 kg)  03/03/22 210 lb (95.3 kg)  08/10/21 211 lb 14.4 oz (96.1 kg)     GEN: Well nourished, well developed in no acute distress HEENT: Right eye exotropia, otherwise normal NECK: No JVD; No carotid bruits CARDIAC: S1/S2, RRR, no murmurs, rubs, gallops; 2+ peripheral pulses throughout, strong and equal bilaterally RESPIRATORY:  Clear and diminished  to auscultation without rales, wheezing or rhonchi  MUSCULOSKELETAL: Nonpitting BLE edema, with negative  homans' sign, skin dislocation bilaterally with appearance of venous insufficiency; No deformity  SKIN: Warm and dry NEUROLOGIC:  Alert and oriented x 3 PSYCHIATRIC:  Normal affect   ASSESSMENT:    1. Prinzmetal angina (HCC)   2. Other hyperlipidemia   3. Bilateral lower extremity edema   4. Numbness and tingling   5. History of CVA (cerebrovascular accident)   6. Hypokalemia    PLAN:    In order of problems listed above:  Prinzmetal angina Cardiac Catheterization revealed normal coronary arteries. Suspected to be related to coronary vasospasm. Still having episodes of chest pain. Continue amlodipine 5 mg daily and Plavix 75 mg daily. Will initiate Imdur 15 mg daily to reduce chest pain.  Heart healthy diet and regular cardiovascular exercise encouraged.    Hyperlipidemia LDL in 06/2021 revealed 133. Tolerating Crestor well. Will increase Crestor to 10 mg daily. Arrange FLP and LFT at next OV. Heart healthy diet and regular cardiovascular exercise encouraged.   Bilateral lower extremity edema Etiology most likely related to either PVD or chronic venous insufficiency. Will obtain BMET today and if kidney function stable, trial Lasix 40 mg daily x 1 week. Recommended wearing compression stockings, heart healthy diet, and regular, moderate activity.   Numbness and tingling Will arrange bilateral ABI's. Obtain BMET today. Recommended follow up with PCP regarding B12 level. Heart healthy diet and regular cardiovascular exercise encouraged. Recommended wearing compression stockings.   Hx of CVA Questionable stroke like symptoms prior to recent hospital visit. Recommended close follow up with neurologist. Continue Plavix 75 mg daily and is unable to tolerate aspirin. ED precautions discussed.   Hypokalemia Recent labs revealed K+ minimally decreased at 3.2 Order currently in and will  obtain a repeat BMET today as mentioned above. If K+ level still stable, will place order for potassium oral supplement.   7. Disposition: Follow up with APP in 4 weeks or sooner if anything changes.   Medication Adjustments/Labs and Tests Ordered: Current medicines are reviewed at length with the patient today.  Concerns regarding medicines are outlined above.  Orders Placed This Encounter  Procedures   VAS [place] ABI WITH/WO TBI   VAS [place] LOWER EXTREMITY ARTERIAL DUPLEX   Meds ordered this encounter  Medications   isosorbide mononitrate (IMDUR) 30 MG 24 hr tablet    Sig: Take 0.5 tablets (15 mg total) by mouth daily.    Dispense:  45 tablet    Refill:  3   rosuvastatin (CRESTOR) 10 MG tablet    Sig: Take 1 tablet (10 mg total) by mouth daily.    Dispense:  90 tablet    Refill:  3    Patient Instructions  Medication Instructions:  Your physician has recommended you make the following change in your medication:   START Isosorbide (Imdur) 30 mg taking 1/2 tablet daily  INCREASE Rosuvastatin to 10 mg taking 1 daily   *If you need a refill on your cardiac medications before your next appointment, please call your pharmacy*   Lab Work: None ordered today, but will get the BMET, that was previously ordered  If you have labs (blood work) drawn today and your tests are completely normal, you will receive your results only by: [place] (if you have MyChart) OR A paper copy in the mail If you have any lab test that is abnormal or we need to change your treatment, we will call you to review the results.   Testing/Procedures:    Follow-Up: At [facility], you  and your health needs are our priority.  As part of our continuing mission to provide you with exceptional heart care, we have created designated Provider Care Teams.  These Care Teams include your primary Cardiologist (physician) and Advanced Practice Providers (APPs -  Physician Assistants and Nurse  Practitioners) who all work together to provide you with the care you need, when you need it.  We recommend signing up for the patient portal called "MyChart".  Sign up information is provided on this After Visit Summary.  MyChart is used to connect with patients for Virtual Visits (Telemedicine).  Patients are able to view lab/test results, encounter notes, upcoming appointments, etc.  Non-urgent messages can be sent to your provider as well.   To learn more about what you can do with MyChart, go to [email or web address].    Your next appointment:   4 week(s)  The format for your next appointment:   In Person  Provider:   Richardson Dopp, PA-C         Other Instructions   Important Information About Sugar         Signed, Finis Bud, NP  03/21/2022 1:47 PM    [place]

## 2022-03-21 ENCOUNTER — Encounter: Payer: Self-pay | Admitting: Physician Assistant

## 2022-03-21 ENCOUNTER — Ambulatory Visit: Payer: BC Managed Care – PPO | Attending: Physician Assistant | Admitting: Nurse Practitioner

## 2022-03-21 VITALS — BP 116/78 | HR 89 | Ht 67.0 in | Wt 213.2 lb

## 2022-03-21 DIAGNOSIS — Z8673 Personal history of transient ischemic attack (TIA), and cerebral infarction without residual deficits: Secondary | ICD-10-CM

## 2022-03-21 DIAGNOSIS — E785 Hyperlipidemia, unspecified: Secondary | ICD-10-CM | POA: Insufficient documentation

## 2022-03-21 DIAGNOSIS — I201 Angina pectoris with documented spasm: Secondary | ICD-10-CM | POA: Diagnosis not present

## 2022-03-21 DIAGNOSIS — E7849 Other hyperlipidemia: Secondary | ICD-10-CM

## 2022-03-21 DIAGNOSIS — R202 Paresthesia of skin: Secondary | ICD-10-CM | POA: Insufficient documentation

## 2022-03-21 DIAGNOSIS — R600 Localized edema: Secondary | ICD-10-CM | POA: Insufficient documentation

## 2022-03-21 DIAGNOSIS — E876 Hypokalemia: Secondary | ICD-10-CM

## 2022-03-21 DIAGNOSIS — R200 Anesthesia of skin: Secondary | ICD-10-CM | POA: Insufficient documentation

## 2022-03-21 LAB — BASIC METABOLIC PANEL
BUN/Creatinine Ratio: 11 (ref 9–23)
BUN: 12 mg/dL (ref 6–24)
CO2: 24 mmol/L (ref 20–29)
Calcium: 9.7 mg/dL (ref 8.7–10.2)
Chloride: 101 mmol/L (ref 96–106)
Creatinine, Ser: 1.14 mg/dL — ABNORMAL HIGH (ref 0.57–1.00)
Glucose: 105 mg/dL — ABNORMAL HIGH (ref 70–99)
Potassium: 4.1 mmol/L (ref 3.5–5.2)
Sodium: 137 mmol/L (ref 134–144)
eGFR: 58 mL/min/{1.73_m2} — ABNORMAL LOW (ref >59)

## 2022-03-21 MED ORDER — ISOSORBIDE MONONITRATE ER 30 MG PO TB24: 15.0000 mg | ORAL_TABLET | Freq: Every day | ORAL | 3 refills | Status: DC

## 2022-03-21 MED ORDER — ROSUVASTATIN CALCIUM 10 MG PO TABS: 10.0000 mg | ORAL_TABLET | Freq: Every day | ORAL | 3 refills | Status: DC

## 2022-03-21 NOTE — Patient Instructions (Addendum)
Medication Instructions:  Your physician has recommended you make the following change in your medication:   START Isosorbide (Imdur) 30 mg taking 1/2 tablet daily  INCREASE Rosuvastatin to 10 mg taking 1 daily   *If you need a refill on your cardiac medications before your next appointment, please call your pharmacy*   Lab Work: None ordered today, but will get the BMET, that was previously ordered  If you have labs (blood work) drawn today and your tests are completely normal, you will receive your results only by: [place] (if you have MyChart) OR A paper copy in the mail If you have any lab test that is abnormal or we need to change your treatment, we will call you to review the results.   Testing/Procedures: Your physician has requested that you have a lower extremity arterial exercise duplex with ABI's. During this test, exercise and ultrasound are used to evaluate arterial blood flow in the legs. Allow one hour for this exam. There are no restrictions or special instructions.    Follow-Up: At [facility], you and your health needs are our priority.  As part of our continuing mission to provide you with exceptional heart care, we have created designated Provider Care Teams.  These Care Teams include your primary Cardiologist (physician) and Advanced Practice Providers (APPs -  Physician Assistants and Nurse Practitioners) who all work together to provide you with the care you need, when you need it.  We recommend signing up for the patient portal called "MyChart".  Sign up information is provided on this After Visit Summary.  MyChart is used to connect with patients for Virtual Visits (Telemedicine).  Patients are able to view lab/test results, encounter notes, upcoming appointments, etc.  Non-urgent messages can be sent to your provider as well.   To learn more about what you can do with MyChart, go to [email or web address].    Your next appointment:   4  week(s)  The format for your next appointment:   In Person  Provider:   Richardson Dopp, PA-C         Other Instructions   Important Information About Sugar

## 2022-04-05 ENCOUNTER — Ambulatory Visit (HOSPITAL_COMMUNITY)
Admission: RE | Admit: 2022-04-05 | Discharge: 2022-04-05 | Disposition: A | Discharge status: Home / Self Care | Payer: BC Managed Care – PPO | Source: Ambulatory Visit | Attending: Nurse Practitioner | Admitting: Nurse Practitioner

## 2022-04-05 DIAGNOSIS — R200 Anesthesia of skin: Secondary | ICD-10-CM | POA: Insufficient documentation

## 2022-04-05 DIAGNOSIS — R202 Paresthesia of skin: Secondary | ICD-10-CM | POA: Insufficient documentation

## 2022-04-17 NOTE — Progress Notes (Deleted)
Cardiology Office Note    Date:  04/17/2022   ID:  Paige Woodward, DOB Oct 19, 1970, MRN [id number]  PCP:  Norm Salt, PA  Cardiologist:  Kristeen Miss, MD  Electrophysiologist:  None   Chief Complaint: follow up   History of Present Illness:   Paige Woodward is a 51 y.o. female with history of NSTEMI, CAD, HTN, HLD, fibromyalgia, GERD, CVA in 2018  ime.  Was on Eliquis for few months and then was discontinued.  Presented to the ED on March 03, 2022 for evaluation of chest pain.  Previous heart cath this year was negative for significant CAD.  TTE showed normal EF without RWMA.  During this admission, troponin rose to 21,000.  EKG revealed T wave inversions in leads III and aVF.  Admitted with NSTEMI.  Chest x-ray unremarkable.  Left heart cath revealed normal coronary arteries, normal LV function.  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  Inflammatory markers negative.  Cardiac MRI was negative for myocarditis, but did have basal inferior wall infarct, focal region of hypokinesis and basal inferior wall but normal LVEF and RVEF.  Differential included coronary vasospasm or embolic event.  D-dimer was mildly elevated, but LE duplex was negative for DVT.  Started on amlodipine 5 mg daily for coronary vasospasm.  Given her history of CVA and her inability to tolerate aspirin, she was kept on Plavix at discharge.  Was given monitor at discharge to rule out A-fib.       Labwork independently reviewed: 03/21/22 Na 137, K 4.1, Cr 1.14, GFR 48   Cardiology Studies:   Studies reviewed are outlined and summarized above. Reports included below if pertinent.   Vascular Ultrasound lower extremity DVT on 03/04/22: RIGHT:  - There is no evidence of deep vein thrombosis in the lower extremity.  However, portions of this examination were limited- see technologist  comments above.  - No cystic structure found in the popliteal fossa.  LEFT:  - There is no evidence of deep vein  thrombosis in the lower extremity.  - No cystic structure found in the popliteal fossa.  2D Echo on 03/04/22:  1. Left ventricular ejection fraction, by estimation, is 65 to 70%. The  left ventricle has normal function. The left ventricle has no regional  wall motion abnormalities. Left ventricular diastolic parameters were  normal.   2. Right ventricular systolic function is normal. The right ventricular  size is normal.   3. MR is eccentric, directed anterior into LA. Mild mitral valve  regurgitation.   4. The aortic valve is normal in structure. Aortic valve regurgitation is  not visualized  cMRI on 03/04/22: 1. Subendocardial late gadolinium enhancement in focal region of basal inferior wall consistent with infarct.   2.  No evidence of myocarditis   3. Normal LV size, wall thickness, and global systolic function (EF 59%). Focal region of hypokinesis in basal inferior wall   4. Normal RV size and systolic function (EF 63%)  Left heart cath and coronary angiography on 03/03/22: Paige Woodward has normal coronary arteries as she did back in February of this year. Her LV function was normal. 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 The sheath was removed and pressure held in the groin to achieve hemostasis. The patient left lab in stable condition. She would benefit from being started on a low-dose calcium channel blocker.      Past Medical History:  Diagnosis Date   Bilateral lower extremity edema  Fibromyalgia    GERD (gastroesophageal reflux disease)    History of stroke    Hyperlipidemia    Hypertension    Hypokalemia    MI (myocardial infarction) (HCC)    NSTEMI (non-ST elevated myocardial infarction) (HCC)    Numbness and tingling    Prinzmetal angina (HCC)    Ulcerative colitis (HCC)     Past Surgical History:  Procedure Laterality Date   ABDOMINAL HYSTERECTOMY     APPENDECTOMY     CARDIAC CATHETERIZATION     CHOLECYSTECTOMY     EYE SURGERY      LEFT HEART CATH AND CORONARY ANGIOGRAPHY N/A 06/22/2021   Procedure: LEFT HEART CATH AND CORONARY ANGIOGRAPHY;  Surgeon: Lennette Bihari, MD;  Location: [facility];  Service: Cardiovascular;  Laterality: N/A;   LEFT HEART CATH AND CORONARY ANGIOGRAPHY N/A 03/03/2022   Procedure: LEFT HEART CATH AND CORONARY ANGIOGRAPHY;  Surgeon: Runell Gess, MD;  Location: [facility];  Service: Cardiovascular;  Laterality: N/A;   SPLENECTOMY      Current Medications: No outpatient medications have been marked as taking for the 04/18/22 encounter (Appointment) with Tereso Newcomer T, PA-C.   ***   Allergies:   Ivp dye [iodinated contrast media], Lipitor [atorvastatin], and Penicillins   Social History   Socioeconomic History   Marital status: Married    Spouse name: Not on file   Number of children: Not on file   Years of education: Not on file   Highest education level: Not on file  Occupational History   Not on file  Tobacco Use   Smoking status: Every Day    Types: Cigarettes   Smokeless tobacco: Never  Vaping Use   Vaping Use: Never used  Substance and Sexual Activity   Alcohol use: No   Drug use: No   Sexual activity: Not on file  Other Topics Concern   Not on file  Social History Narrative   Not on file   Social Determinants of Health   Financial Resource Strain: Medium Risk (08/04/2021)   Overall Financial Resource Strain (CARDIA)    Difficulty of Paying Living Expenses: Somewhat hard  Food Insecurity: No Food Insecurity (08/04/2021)   Hunger Vital Sign    Worried About Running Out of Food in the Last Year: Never true    Ran Out of Food in the Last Year: Never true  Transportation Needs: No Transportation Needs (08/04/2021)   PRAPARE - Administrator, Civil Service (Medical): No    Lack of Transportation (Non-Medical): No  Physical Activity: Not on file  Stress: Not on file  Social Connections: Not on file     Family History:  The patient's  ***family history includes Stroke in her father.  ROS:   Please see the history of present illness. Otherwise, review of systems is positive for ***.  All other systems are reviewed and otherwise negative.    EKG(s)/Additional Labs   EKG:  EKG is ordered today, personally reviewed, demonstrating ***  Recent Labs: 06/21/2021: B Natriuretic Peptide 61.9; TSH 1.331 03/04/2022: Hemoglobin 13.5; Platelets 493 03/21/2022: BUN 12; Creatinine, Ser 1.14; Potassium 4.1; Sodium 137  Recent Lipid Panel    Component Value Date/Time   CHOL 180 06/22/2021 0614   TRIG 46 06/22/2021 0614   HDL 38 (L) 06/22/2021 0614   CHOLHDL 4.7 06/22/2021 0614   VLDL 9 06/22/2021 0614   LDLCALC 133 (H) 06/22/2021 0614    PHYSICAL EXAM:    VS:  There were no vitals taken for this visit.  BMI: There is no height or weight on file to calculate BMI.  GEN: Well nourished, well developed female in no acute distress HEENT: normocephalic, atraumatic Neck: no JVD, carotid bruits, or masses Cardiac: ***RRR; no murmurs, rubs, or gallops, no edema  Respiratory:  clear to auscultation bilaterally, normal work of breathing GI: soft, nontender, nondistended, + BS MS: no deformity or atrophy Skin: warm and dry, no rash Neuro:  Alert and Oriented x 3, Strength and sensation are intact, follows commands Psych: euthymic mood, full affect  Wt Readings from Last 3 Encounters:  03/21/22 213 lb 3.2 oz (96.7 kg)  03/03/22 210 lb (95.3 kg)  08/10/21 211 lb 14.4 oz (96.1 kg)     ASSESSMENT & PLAN:   Prinzmetal angina - imdur added last visit  CAD - plavix 75 mg QD, toprol 25 mg QD HTN - BP today,  , norvasc 5 mg added last visit,  HLD - crestor increased to 10 mg QD last visit, will check FLP and LFT panel  Edema -      Disposition: F/u with ***   Medication Adjustments/Labs and Tests Ordered: Current medicines are reviewed at length with the patient today.  Concerns regarding medicines are outlined above.  Medication changes, Labs and Tests ordered today are summarized above and listed in the Patient Instructions accessible in Encounters.   Signed, Flossie Dibble, NP  04/17/2022 5:37 PM    [facility] Phone: [phone]; Fax: [phone]

## 2022-04-18 ENCOUNTER — Ambulatory Visit: Payer: BC Managed Care – PPO | Admitting: Physician Assistant

## 2022-04-19 NOTE — Progress Notes (Deleted)
Cardiology Office Note:    Date:  04/19/2022   ID:  Paige Woodward, DOB 1970/11/22, MRN [id number]  PCP:  Norm Salt, PA   [facility] Providers Cardiologist:  Kristeen Miss, MD {  Referring MD: Norm Salt, PA   No chief complaint on file. ***  History of Present Illness:    Paige Woodward is a 51 y.o. female with a hx of NSTEMI (thought to be due to coronary vasospasm), hypertension, hyperlipidemia, history of stroke in 2018, fibromyalgia, chronic pain syndrome, GERD.  Patient is followed by Dr. Elease Hashimoto and presents today for ***. Per chart review, patient had an NSTEMI about 15 years ago. She underwent cardiac catheterization at [facility] hospital, no stent was placed.  Later she had a stroke in 2018.  There was questionable atrial fibrillation at that time.  She was started on Eliquis for few months but then it was discontinued.   Patient was admitted to the hospital in 06/2021 for evaluation of chest pain, elevated troponins.  Echocardiogram on 06/21/2021 showed EF 60-65%, no regional wall motion abnormalities, grade 1 diastolic dysfunction.  Left heart catheterization on 06/22/2021 showed normal epicardial coronary arteries, normal LVEDP at 10 mmHg.    She was again admitted in 02/2022 for evaluation of chest pain, elevated troponin to [id number].  Echocardiogram on 03/03/2022 showed EF 60-65%, no regional wall motion abnormalities, normal RV systolic function.  Left heart catheterization on 03/03/2022 showed normal coronary arteries with normal LV function.  Noted that her symptoms were likely related to coronary vasospasm she was started on a low-dose calcium channel blocker.  Cardiac MRI on 03/04/2022 showed subendocardial late gadolinium enhancement and focal region of basal inferior wall consistent with infarct, no evidence of myocarditis.  Patient was discharged on amlodipine, Plavix, Toprol XL, Crestor.  Also wore a monitor after discharge that showed  normal sinus rhythm, rare PACs and rare PVCs, no arrhythmias observed.  She was last seen by cardiology on 03/21/2022.  At that time, patient reported that she had continued to have episodes of chest pain.  She was started on Imdur 15 mg daily.  Prinzmetal angina - Patient was admitted to the hospital in 06/2021 for evaluation of chest pain, elevated troponins.  Left heart catheterization showed normal epicardial coronary arteries. -Again admitted in 02/2022 with similar complaints, troponin elevated to greater than 21,000.  Cardiac catheterization with normal coronary arteries.  Cardiac MRI with evidence of infarct.  Suspected her symptoms and cardiac MRI findings secondary to coronary vasospasm -Patient has been treated with amlodipine 5 mg daily.  Was started on Imdur 15 mg daily in November.  Hyperlipidemia - Lipid panel from 06/22/2021 showed total cholesterol 180, HDL 38, LDL 133, triglycerides 46 -Crestor was increased to 10 mg daily in November.  Patient needs fasting lipids and hepatic function tests in 1 month -Note, patient had ABIs on 04/05/2022 that showed normal bilateral ABIs  History of CVA -Patient had stroke in 2018.  Recent cardiac monitor showed no signs of atrial fibrillation -Continue Plavix 75 mg daily (attempted transition to aspirin in the past but patient has a history of GI upset with aspirin)    Past Medical History:  Diagnosis Date   Bilateral lower extremity edema    Fibromyalgia    GERD (gastroesophageal reflux disease)    History of stroke    Hyperlipidemia    Hypertension    Hypokalemia    MI (myocardial infarction) (HCC)    NSTEMI (non-ST elevated myocardial infarction) (  HCC)    Numbness and tingling    Prinzmetal angina (HCC)    Ulcerative colitis (HCC)     Past Surgical History:  Procedure Laterality Date   ABDOMINAL HYSTERECTOMY     APPENDECTOMY     CARDIAC CATHETERIZATION     CHOLECYSTECTOMY     EYE SURGERY     LEFT HEART CATH AND CORONARY  ANGIOGRAPHY N/A 06/22/2021   Procedure: LEFT HEART CATH AND CORONARY ANGIOGRAPHY;  Surgeon: Lennette Bihari, MD;  Location: [facility];  Service: Cardiovascular;  Laterality: N/A;   LEFT HEART CATH AND CORONARY ANGIOGRAPHY N/A 03/03/2022   Procedure: LEFT HEART CATH AND CORONARY ANGIOGRAPHY;  Surgeon: Runell Gess, MD;  Location: [facility];  Service: Cardiovascular;  Laterality: N/A;   SPLENECTOMY      Current Medications: No outpatient medications have been marked as taking for the 04/21/22 encounter (Appointment) with Laurann Montana, PA-C.     Allergies:   Ivp dye [iodinated contrast media], Lipitor [atorvastatin], and Penicillins   Social History   Socioeconomic History   Marital status: Married    Spouse name: Not on file   Number of children: Not on file   Years of education: Not on file   Highest education level: Not on file  Occupational History   Not on file  Tobacco Use   Smoking status: Every Day    Types: Cigarettes   Smokeless tobacco: Never  Vaping Use   Vaping Use: Never used  Substance and Sexual Activity   Alcohol use: No   Drug use: No   Sexual activity: Not on file  Other Topics Concern   Not on file  Social History Narrative   Not on file   Social Determinants of Health   Financial Resource Strain: Medium Risk (08/04/2021)   Overall Financial Resource Strain (CARDIA)    Difficulty of Paying Living Expenses: Somewhat hard  Food Insecurity: No Food Insecurity (08/04/2021)   Hunger Vital Sign    Worried About Running Out of Food in the Last Year: Never true    Ran Out of Food in the Last Year: Never true  Transportation Needs: No Transportation Needs (08/04/2021)   PRAPARE - Administrator, Civil Service (Medical): No    Lack of Transportation (Non-Medical): No  Physical Activity: Not on file  Stress: Not on file  Social Connections: Not on file     Family History: The patient's ***family history includes Stroke in her  father.  ROS:   Please see the history of present illness.    *** All other systems reviewed and are negative.  EKGs/Labs/Other Studies Reviewed:    The following studies were reviewed today: ***  EKG:  EKG is *** ordered today.  The ekg ordered today demonstrates ***  Recent Labs: 06/21/2021: B Natriuretic Peptide 61.9; TSH 1.331 03/04/2022: Hemoglobin 13.5; Platelets 493 03/21/2022: BUN 12; Creatinine, Ser 1.14; Potassium 4.1; Sodium 137  Recent Lipid Panel    Component Value Date/Time   CHOL 180 06/22/2021 0614   TRIG 46 06/22/2021 0614   HDL 38 (L) 06/22/2021 0614   CHOLHDL 4.7 06/22/2021 0614   VLDL 9 06/22/2021 0614   LDLCALC 133 (H) 06/22/2021 0614     Risk Assessment/Calculations:   {Does this patient have ATRIAL FIBRILLATION?:[id number]}  No BP recorded.  {Refresh Note OR Click here to enter BP  :1}***         Physical Exam:    VS:  There were  no vitals taken for this visit.    Wt Readings from Last 3 Encounters:  03/21/22 213 lb 3.2 oz (96.7 kg)  03/03/22 210 lb (95.3 kg)  08/10/21 211 lb 14.4 oz (96.1 kg)     GEN: *** Well nourished, well developed in no acute distress HEENT: Normal NECK: No JVD; No carotid bruits LYMPHATICS: No lymphadenopathy CARDIAC: ***RRR, no murmurs, rubs, gallops RESPIRATORY:  Clear to auscultation without rales, wheezing or rhonchi  ABDOMEN: Soft, non-tender, non-distended MUSCULOSKELETAL:  No edema; No deformity  SKIN: Warm and dry NEUROLOGIC:  Alert and oriented x 3 PSYCHIATRIC:  Normal affect   ASSESSMENT:    No diagnosis found. PLAN:    In order of problems listed above:  ***      {Are you ordering a CV Procedure (e.g. stress test, cath, DCCV, TEE, etc)?   Press F2        :[id number]}    Medication Adjustments/Labs and Tests Ordered: Current medicines are reviewed at length with the patient today.  Concerns regarding medicines are outlined above.  No orders of the defined types were placed in this  encounter.  No orders of the defined types were placed in this encounter.   There are no Patient Instructions on file for this visit.   Signed, Jonita Albee, PA-C  04/19/2022 1:07 PM    [facility]

## 2022-04-21 ENCOUNTER — Ambulatory Visit: Payer: BC Managed Care – PPO | Admitting: Physician Assistant

## 2022-04-22 NOTE — Progress Notes (Deleted)
Office Visit    Patient Name: Novaleigh Kohlman Date of Encounter: 04/22/2022  Primary Care Provider:  Norm Salt, PA Primary Cardiologist:  Kristeen Miss, MD Primary Electrophysiologist: None  Chief Complaint    Kyrstal Monterrosa is a 51 y.o. female with PMH of CAD s/p NSTEMI LHC with normal coronaries, stroke/TIA 2018, HLD, possible atrial fibrillation, fibromyalgia, GERD who presents today for 4-week follow-up.  Past Medical History    Past Medical History:  Diagnosis Date   Bilateral lower extremity edema    Fibromyalgia    GERD (gastroesophageal reflux disease)    History of stroke    Hyperlipidemia    Hypertension    Hypokalemia    MI (myocardial infarction) (HCC)    NSTEMI (non-ST elevated myocardial infarction) (HCC)    Numbness and tingling    Prinzmetal angina (HCC)    Ulcerative colitis (HCC)    Past Surgical History:  Procedure Laterality Date   ABDOMINAL HYSTERECTOMY     APPENDECTOMY     CARDIAC CATHETERIZATION     CHOLECYSTECTOMY     EYE SURGERY     LEFT HEART CATH AND CORONARY ANGIOGRAPHY N/A 06/22/2021   Procedure: LEFT HEART CATH AND CORONARY ANGIOGRAPHY;  Surgeon: Lennette Bihari, MD;  [facility];  Service: Cardiovascular;  Laterality: N/A;   LEFT HEART CATH AND CORONARY ANGIOGRAPHY N/A 03/03/2022   Procedure: LEFT HEART CATH AND CORONARY ANGIOGRAPHY;  Surgeon: Runell Gess, MD;  Location: [facility];  Service: Cardiovascular;  Laterality: N/A;   SPLENECTOMY      Allergies  Allergies  Allergen Reactions   Ivp Dye [Iodinated Contrast Media] Hives   Lipitor [Atorvastatin] Other (See Comments)    Myalgias   Penicillins Other (See Comments)    Childhood allergy Unknown reaction    History of Present Illness    Trameka Dorough  is a 51 year old female with the above mention past medical history who presents today for 4-week follow-up.  Ms. Montilla was initially seen by our practice in 06/2021 for complaint of chest  pain and found to have elevated troponins.  She has previous history of NSTEMI with left heart cath completed at [facility] hospital with no stents placed at that time.  In 2018 she suffered a stroke and had a negative bubble study by 2D echo and was started on Eliquis for a few months and then discontinued but remained on Plavix.  She had elevated renal function and underwent gentle hydration with LHC performed that revealed no significant CAD.  She was discharged in stable condition with recommendation to hold atenolol due to the soft blood pressures and low-dose Crestor due to intolerance to Lipitor.  TTE was performed and revealed no acute wall motion abnormalities or reduced EF.    She was seen in follow-up by Gillian Shields, NP and was doing well with couple of episodes of chest pain pain that were short-lived and resolved spontaneously.  She was started on amlodipine 5 mg daily and was also started on Wellbutrin for smoking cessation.  She was seen in the ED on 02/2022 for complaint of chest pain.  She reported 10 out of 10 chest pain that was described as crushing and was not relieved with as needed nitroglycerin.  She reported shortness of breath as well and presented to the ED for further evaluation.  On arrival to ED patient's pain improved to 2 out of 10 and EKG revealed T wave inversion in lead  III and aVF.  Troponins were completed and elevated to 3793.  LHC was repeated and revealed no CAD.  Suspect coronary vasospasm or myocarditis as possible differential.  Patient underwent cardiac MRI to rule out myocarditis that was negative for myocarditis but did show basal inferior wall infarct with focal hypokinesis but normal EF.  She was seen in follow-up by Sharlene Dory, NP on 03/21/2022 and reported doing well with a couple instances of chest pain.  She was started on Imdur 15 mg and Crestor was increased to 10 mg daily.  She also was referred for ABIs to rule out PAD.  Since last being seen  in the office patient reports***.  Patient denies chest pain, palpitations, dyspnea, PND, orthopnea, nausea, vomiting, dizziness, syncope, edema, weight gain, or early satiety.     ***Notes:  Home Medications    Current Outpatient Medications  Medication Sig Dispense Refill   acetaminophen (TYLENOL) 500 MG tablet Take 1,000 mg by mouth daily as needed for headache.     acetaminophen (TYLENOL) 650 MG CR tablet Take 1,300 mg by mouth every 8 (eight) hours as needed for pain.     amLODipine (NORVASC) 5 MG tablet Take 1 tablet (5 mg total) by mouth daily. 90 tablet 3   calcium carbonate (ALKA-SELTZER HEARTBURN) 750 MG chewable tablet Chew 1,500 mg by mouth daily as needed for heartburn.     clopidogrel (PLAVIX) 75 MG tablet Take 75 mg by mouth at bedtime.     hydrOXYzine (VISTARIL) 25 MG capsule Take 50 mg by mouth at bedtime.     isosorbide mononitrate (IMDUR) 30 MG 24 hr tablet Take 0.5 tablets (15 mg total) by mouth daily. 45 tablet 3   metoprolol succinate (TOPROL XL) 25 MG 24 hr tablet Take 1 tablet (25 mg total) by mouth daily. 90 tablet 3   nitroGLYCERIN (NITROSTAT) 0.4 MG SL tablet Place 1 tablet (0.4 mg total) under the tongue every 5 (five) minutes as needed for chest pain. 25 tablet 11   nortriptyline (PAMELOR) 25 MG capsule Take 50 mg by mouth at bedtime.     oxyCODONE (ROXICODONE) 15 MG immediate release tablet Take 15 mg by mouth 3 (three) times daily as needed for pain.     pantoprazole (PROTONIX) 40 MG tablet TAKE 1 TABLET BY MOUTH DAILY(FURTHER REFILLS BY PCP) 30 tablet 0   rosuvastatin (CRESTOR) 10 MG tablet Take 1 tablet (10 mg total) by mouth daily. 90 tablet 3   No current facility-administered medications for this visit.     Review of Systems  Please see the history of present illness.    (+)*** (+)***  All other systems reviewed and are otherwise negative except as noted above.  Physical Exam    Wt Readings from Last 3 Encounters:  03/21/22 213 lb 3.2 oz (96.7  kg)  03/03/22 210 lb (95.3 kg)  08/10/21 211 lb 14.4 oz (96.1 kg)   ME:QASTM were no vitals filed for this visit.,There is no height or weight on file to calculate BMI.  Constitutional:      Appearance: Healthy appearance. Not in distress.  Neck:     Vascular: JVD normal.  Pulmonary:     Effort: Pulmonary effort is normal.     Breath sounds: No wheezing. No rales. Diminished in the bases Cardiovascular:     Normal rate. Regular rhythm. Normal S1. Normal S2.      Murmurs: There is no murmur.  Edema:    Peripheral edema absent.  Abdominal:  Palpations: Abdomen is soft non tender. There is no hepatomegaly.  Skin:    General: Skin is warm and dry.  Neurological:     General: No focal deficit present.     Mental Status: Alert and oriented to person, place and time.     Cranial Nerves: Cranial nerves are intact.  EKG/LABS/Other Studies Reviewed    ECG personally reviewed by me today - ***  Risk Assessment/Calculations:   {Does this patient have ATRIAL FIBRILLATION?:[phone]}        Lab Results  Component Value Date   WBC 15.4 (H) 03/04/2022   HGB 13.5 03/04/2022   HCT 36.1 03/04/2022   MCV 83.8 03/04/2022   PLT 493 (H) 03/04/2022   Lab Results  Component Value Date   CREATININE 1.14 (H) 03/21/2022   BUN 12 03/21/2022   NA 137 03/21/2022   K 4.1 03/21/2022   CL 101 03/21/2022   CO2 24 03/21/2022   Lab Results  Component Value Date   ALT 21 10/25/2018   AST 26 10/25/2018   ALKPHOS 67 10/25/2018   BILITOT 0.9 10/25/2018   Lab Results  Component Value Date   CHOL 180 06/22/2021   HDL 38 (L) 06/22/2021   LDLCALC 133 (H) 06/22/2021   TRIG 46 06/22/2021   CHOLHDL 4.7 06/22/2021    Lab Results  Component Value Date   HGBA1C 4.8 06/21/2021    Assessment & Plan    1.  History of NSTEMI: -s/p LHC performed 02/2022 with normal coronaries and LV function with symptoms related to possible vasospasm -She presents today and reports*** -Continue GDMT with  Crestor 10 mg, Toprol 25 mg, Imdur 15 mg, Plavix 75 mg and Norvasc 5 mg  2.  Essential hypertension: -Patient's blood pressure today was*** -Continue Toprol and amlodipine as noted above  3.  Hyperlipidemia: -Patient will require repeat LFTs and lipids due to increased Crestor 10 mg  4.  History of CVA: -Continue Plavix 75 mg and Crestor 10 mg as noted above      Disposition: Follow-up with Kristeen Miss, MD or APP in *** months {Are you ordering a CV Procedure (e.g. stress test, cath, DCCV, TEE, etc)?   Press F2        :[id number]}   Medication Adjustments/Labs and Tests Ordered: Current medicines are reviewed at length with the patient today.  Concerns regarding medicines are outlined above.   Signed, Napoleon Form, Leodis Rains, NP 04/22/2022, 1:28 PM [facility]  Note:  This document was prepared using Dragon voice recognition software and may include unintentional dictation errors.

## 2022-04-26 ENCOUNTER — Ambulatory Visit: Payer: BC Managed Care – PPO | Admitting: Nurse Practitioner

## 2022-04-26 DIAGNOSIS — G894 Chronic pain syndrome: Secondary | ICD-10-CM

## 2022-10-27 ENCOUNTER — Telehealth: Payer: Self-pay | Admitting: Cardiovascular Disease

## 2022-10-27 ENCOUNTER — Other Ambulatory Visit: Payer: Self-pay

## 2022-10-27 MED ORDER — METOPROLOL SUCCINATE ER 25 MG PO TB24: 25.0000 mg | ORAL_TABLET | Freq: Every day | ORAL | 1 refills | Status: DC

## 2022-10-27 NOTE — Telephone Encounter (Signed)
Pt's medication was already sent to pt's pharmacy as requested. Confirmation received.  

## 2022-10-27 NOTE — Telephone Encounter (Signed)
*  STAT* If patient is at the pharmacy, call can be transferred to refill team.   1. Which medications need to be refilled? (please list name of each medication and dose if known)  metoprolol succinate (TOPROL XL) 25 MG 24 hr tablet  2. Which pharmacy/location (including street and city if local pharmacy) is medication to be sent to? [id number] - [place], [place] - [address]    3. Do they need a 30 day or 90 day supply? 90 day  Pt has scheduled appt on 8/13.

## 2022-12-01 IMAGING — DX DG CHEST 2V
2 series · 2 of 2 positions shown · non-contrast
Comparison: 12/14/2019

CLINICAL DATA: Chest pain

EXAM:
CHEST - 2 VIEW

[chest pa]
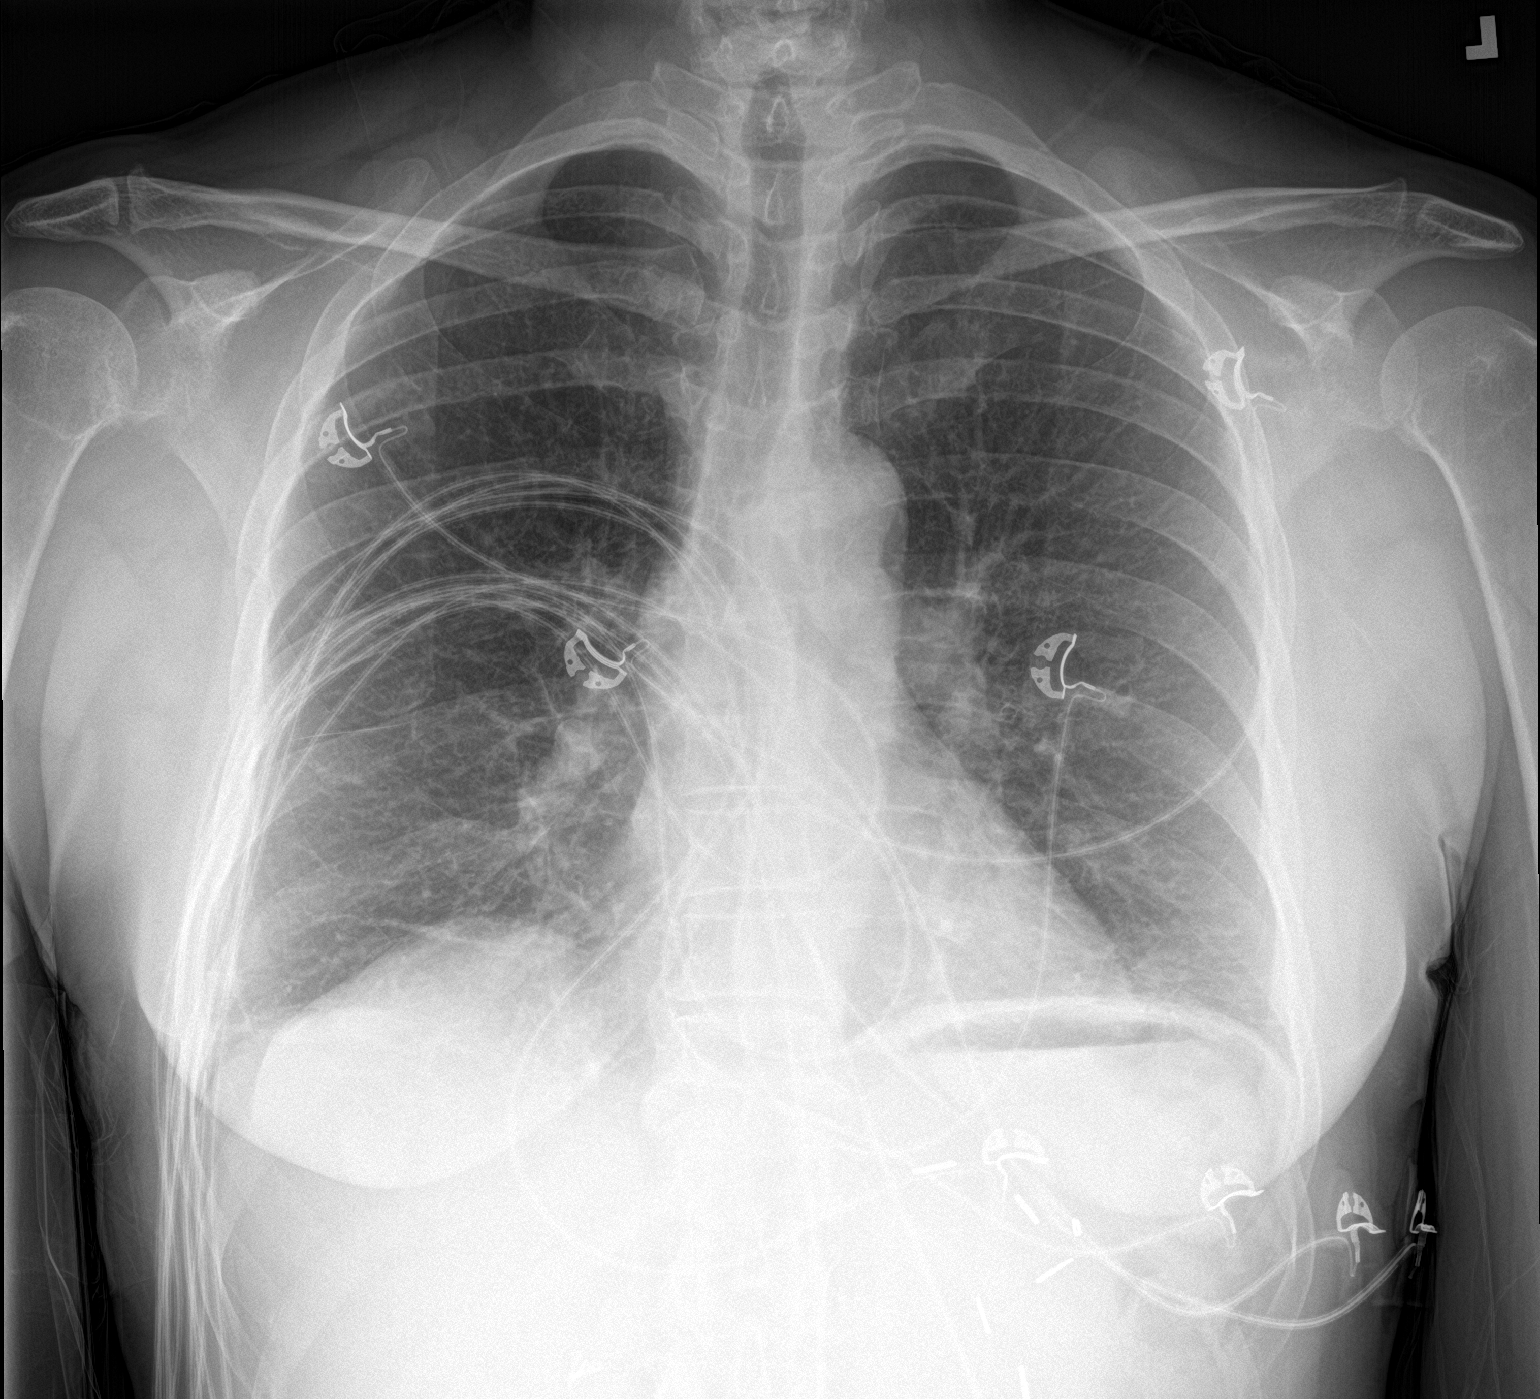

[chest lat]
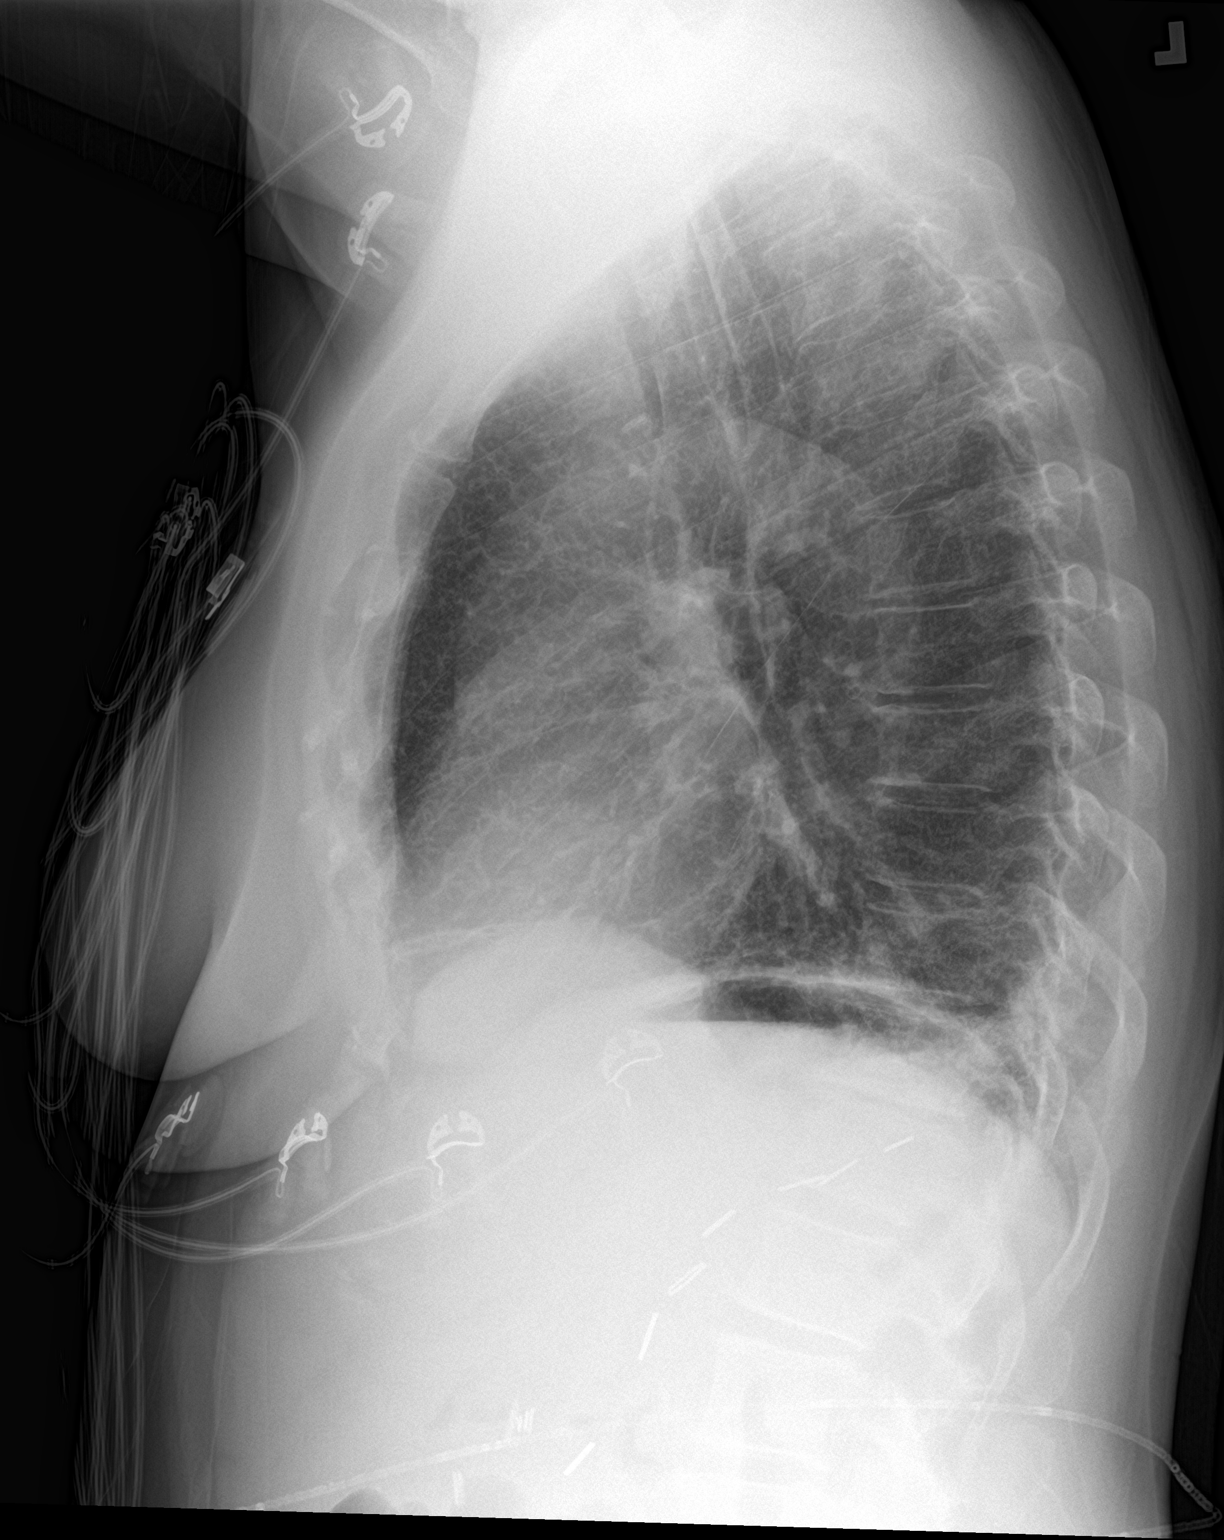

[2 of 2 positions shown; findings below may reference images not displayed]

FINDINGS: Cardiac and mediastinal contours are within normal limits. Mild
dependent atelectasis. No other focal pulmonary opacity. No pleural
effusion or pneumothorax. No acute osseous abnormality.
IMPRESSION: No acute cardiopulmonary process.

## 2022-12-26 NOTE — Progress Notes (Deleted)
Office Visit    Patient Name: Paige Woodward Date of Encounter: 12/26/2022  Primary Care Provider:  Norm Salt, PA Primary Cardiologist:  Kristeen Miss, MD Primary Electrophysiologist: None   Past Medical History    Past Medical History:  Diagnosis Date   Bilateral lower extremity edema    Fibromyalgia    GERD (gastroesophageal reflux disease)    History of stroke    Hyperlipidemia    Hypertension    Hypokalemia    MI (myocardial infarction) (HCC)    NSTEMI (non-ST elevated myocardial infarction) (HCC)    Numbness and tingling    Prinzmetal angina (HCC)    Ulcerative colitis (HCC)    Past Surgical History:  Procedure Laterality Date   ABDOMINAL HYSTERECTOMY     APPENDECTOMY     CARDIAC CATHETERIZATION     CHOLECYSTECTOMY     EYE SURGERY     LEFT HEART CATH AND CORONARY ANGIOGRAPHY N/A 06/22/2021   Procedure: LEFT HEART CATH AND CORONARY ANGIOGRAPHY;  Surgeon: Lennette Bihari, MD;  Location: [facility];  Service: Cardiovascular;  Laterality: N/A;   LEFT HEART CATH AND CORONARY ANGIOGRAPHY N/A 03/03/2022   Procedure: LEFT HEART CATH AND CORONARY ANGIOGRAPHY;  Surgeon: Runell Gess, MD;  Location: [facility];  Service: Cardiovascular;  Laterality: N/A;   SPLENECTOMY      Allergies  Allergies  Allergen Reactions   Ivp Dye [Iodinated Contrast Media] Hives   Lipitor [Atorvastatin] Other (See Comments)    Myalgias   Penicillins Other (See Comments)    Childhood allergy Unknown reaction     History of Present Illness     Paige Woodward  is a 52 year old female with the above mention past medical history who presents today for 4-week follow-up.  Ms. Brossett was initially seen by our practice in 06/2021 for complaint of chest pain and found to have elevated troponins.  She has previous history of NSTEMI with left heart cath completed at [facility] hospital with no stents placed at that time.  In 2018 she suffered a stroke and had a  negative bubble study by 2D echo and was started on Eliquis for a few months and then discontinued but remained on Plavix.  She had elevated renal function and underwent gentle hydration with LHC performed that revealed no significant CAD.  She was discharged in stable condition with recommendation to hold atenolol due to the soft blood pressures and low-dose Crestor due to intolerance to Lipitor.  TTE was performed and revealed no acute wall motion abnormalities or reduced EF.     She was seen in follow-up by Gillian Shields, NP and was doing well with couple of episodes of chest pain pain that were short-lived and resolved spontaneously.  She was started on amlodipine 5 mg daily and was also started on Wellbutrin for smoking cessation.  She was seen in the ED on 02/2022 for complaint of chest pain.  She reported 10 out of 10 chest pain that was described as crushing and was not relieved with as needed nitroglycerin.  She reported shortness of breath as well and presented to the ED for further evaluation.  On arrival to ED patient's pain improved to 2 out of 10 and EKG revealed T wave inversion in lead III and aVF.  Troponins were completed and elevated to 3793.  LHC was repeated and revealed no CAD.  Suspect coronary vasospasm or myocarditis as possible differential.  Patient underwent cardiac MRI  to rule out myocarditis that was negative for myocarditis but did show basal inferior wall infarct with focal hypokinesis but normal EF.  She was seen in follow-up by Sharlene Dory, NP on 03/21/2022 and reported doing well with a couple instances of chest pain.  She was started on Imdur 15 mg and Crestor was increased to 10 mg daily.  She also was referred for ABIs to rule out PAD.   Since last being seen in the office patient reports***.  Patient denies chest pain, palpitations, dyspnea, PND, orthopnea, nausea, vomiting, dizziness, syncope, edema, weight gain, or early satiety.    ***Notes:  Home Medications     Current Outpatient Medications  Medication Sig Dispense Refill   acetaminophen (TYLENOL) 500 MG tablet Take 1,000 mg by mouth daily as needed for headache.     acetaminophen (TYLENOL) 650 MG CR tablet Take 1,300 mg by mouth every 8 (eight) hours as needed for pain.     amLODipine (NORVASC) 5 MG tablet Take 1 tablet (5 mg total) by mouth daily. 90 tablet 3   calcium carbonate (ALKA-SELTZER HEARTBURN) 750 MG chewable tablet Chew 1,500 mg by mouth daily as needed for heartburn.     clopidogrel (PLAVIX) 75 MG tablet Take 75 mg by mouth at bedtime.     hydrOXYzine (VISTARIL) 25 MG capsule Take 50 mg by mouth at bedtime.     isosorbide mononitrate (IMDUR) 30 MG 24 hr tablet Take 0.5 tablets (15 mg total) by mouth daily. 45 tablet 3   metoprolol succinate (TOPROL XL) 25 MG 24 hr tablet Take 1 tablet (25 mg total) by mouth daily. 90 tablet 1   nitroGLYCERIN (NITROSTAT) 0.4 MG SL tablet Place 1 tablet (0.4 mg total) under the tongue every 5 (five) minutes as needed for chest pain. 25 tablet 11   nortriptyline (PAMELOR) 25 MG capsule Take 50 mg by mouth at bedtime.     oxyCODONE (ROXICODONE) 15 MG immediate release tablet Take 15 mg by mouth 3 (three) times daily as needed for pain.     pantoprazole (PROTONIX) 40 MG tablet TAKE 1 TABLET BY MOUTH DAILY(FURTHER REFILLS BY PCP) 30 tablet 0   rosuvastatin (CRESTOR) 10 MG tablet Take 1 tablet (10 mg total) by mouth daily. 90 tablet 3   No current facility-administered medications for this visit.     Review of Systems  Please see the history of present illness.    (+)*** (+)***  All other systems reviewed and are otherwise negative except as noted above.  Physical Exam    Wt Readings from Last 3 Encounters:  03/21/22 213 lb 3.2 oz (96.7 kg)  03/03/22 210 lb (95.3 kg)  08/10/21 211 lb 14.4 oz (96.1 kg)   NW:GNFAO were no vitals filed for this visit.,There is no height or weight on file to calculate BMI.  Constitutional:      Appearance: Healthy  appearance. Not in distress.  Neck:     Vascular: JVD normal.  Pulmonary:     Effort: Pulmonary effort is normal.     Breath sounds: No wheezing. No rales. Diminished in the bases Cardiovascular:     Normal rate. Regular rhythm. Normal S1. Normal S2.      Murmurs: There is no murmur.  Edema:    Peripheral edema absent.  Abdominal:     Palpations: Abdomen is soft non tender. There is no hepatomegaly.  Skin:    General: Skin is warm and dry.  Neurological:     General: No focal deficit  present.     Mental Status: Alert and oriented to person, place and time.     Cranial Nerves: Cranial nerves are intact.  EKG/LABS/ Recent Cardiac Studies    ECG personally reviewed by me today - ***   Risk Assessment/Calculations:   {Does this patient have ATRIAL FIBRILLATION?:[phone]}        Lab Results  Component Value Date   WBC 15.4 (H) 03/04/2022   HGB 13.5 03/04/2022   HCT 36.1 03/04/2022   MCV 83.8 03/04/2022   PLT 493 (H) 03/04/2022   Lab Results  Component Value Date   CREATININE 1.14 (H) 03/21/2022   BUN 12 03/21/2022   NA 137 03/21/2022   K 4.1 03/21/2022   CL 101 03/21/2022   CO2 24 03/21/2022   Lab Results  Component Value Date   ALT 21 10/25/2018   AST 26 10/25/2018   ALKPHOS 67 10/25/2018   BILITOT 0.9 10/25/2018   Lab Results  Component Value Date   CHOL 180 06/22/2021   HDL 38 (L) 06/22/2021   LDLCALC 133 (H) 06/22/2021   TRIG 46 06/22/2021   CHOLHDL 4.7 06/22/2021    Lab Results  Component Value Date   HGBA1C 4.8 06/21/2021     Assessment & Plan    1.  History of NSTEMI: -s/p LHC performed 02/2022 with normal coronaries and LV function with symptoms related to possible vasospasm -She presents today and reports*** -Continue GDMT with Crestor 10 mg, Toprol 25 mg, Imdur 15 mg, Plavix 75 mg and Norvasc 5 mg   2.  Essential hypertension: -Patient's blood pressure today was*** -Continue Toprol and amlodipine as noted above   3.   Hyperlipidemia: -Patient will require repeat LFTs and lipids due to increased Crestor 10 mg   4.  History of CVA: -Continue Plavix 75 mg and Crestor 10 mg as noted above     Disposition: Follow-up with Kristeen Miss, MD or APP in *** months {Are you ordering a CV Procedure (e.g. stress test, cath, DCCV, TEE, etc)?   Press F2        :[id number]}   Medication Adjustments/Labs and Tests Ordered: Current medicines are reviewed at length with the patient today.  Concerns regarding medicines are outlined above.   Signed, Napoleon Form, Leodis Rains, NP 12/26/2022, 5:51 PM [facility]

## 2022-12-27 ENCOUNTER — Ambulatory Visit: Payer: BC Managed Care – PPO | Attending: Physician Assistant | Admitting: Nurse Practitioner

## 2022-12-27 ENCOUNTER — Encounter: Payer: Self-pay | Admitting: Nurse Practitioner

## 2022-12-27 DIAGNOSIS — I2510 Atherosclerotic heart disease of native coronary artery without angina pectoris: Secondary | ICD-10-CM

## 2022-12-27 DIAGNOSIS — I214 Non-ST elevation (NSTEMI) myocardial infarction: Secondary | ICD-10-CM

## 2022-12-27 DIAGNOSIS — E785 Hyperlipidemia, unspecified: Secondary | ICD-10-CM

## 2022-12-27 DIAGNOSIS — I10 Essential (primary) hypertension: Secondary | ICD-10-CM

## 2022-12-27 DIAGNOSIS — Z8673 Personal history of transient ischemic attack (TIA), and cerebral infarction without residual deficits: Secondary | ICD-10-CM

## 2023-05-02 ENCOUNTER — Other Ambulatory Visit: Payer: Self-pay | Admitting: Physician Assistant

## 2023-05-31 ENCOUNTER — Telehealth: Payer: Self-pay | Admitting: Cardiovascular Disease

## 2023-05-31 MED ORDER — ISOSORBIDE MONONITRATE ER 30 MG PO TB24: 15.0000 mg | ORAL_TABLET | Freq: Every day | ORAL | 0 refills | Status: DC

## 2023-05-31 MED ORDER — AMLODIPINE BESYLATE 5 MG PO TABS: 5.0000 mg | ORAL_TABLET | Freq: Every day | ORAL | 0 refills | Status: DC

## 2023-05-31 NOTE — Telephone Encounter (Signed)
*  STAT* If patient is at the pharmacy, call can be transferred to refill team.   1. Which medications need to be refilled? (please list name of each medication and dose if known)   isosorbide  mononitrate (IMDUR ) 30 MG 24 hr tablet  amLODipine  (NORVASC ) 5 MG tablet   2. Would you like to learn more about the convenience, safety, & potential cost savings by using the [facility]?   3. Are you open to using the [facility] (Type [facility]. ).   4. Which pharmacy/location (including street and city if local pharmacy) is medication to be sent to?  [id number] - [place], [place] - [address]   5. Do they need a 30 day or 90 day supply?   90 day  Patient stated she is completely out of both medications.

## 2023-05-31 NOTE — Progress Notes (Signed)
 Review of Systems  Constitutional:  Positive for activity change and fatigue. Negative for appetite change and unexpected weight change.  HENT:  Negative for hearing loss, tinnitus, trouble swallowing and voice change.   Eyes:  Positive for photophobia. Negative for visual disturbance.  Respiratory:  Negative for apnea, choking, shortness of breath and wheezing.   Cardiovascular:  Positive for palpitations and leg swelling.  Gastrointestinal:  Positive for constipation and nausea. Negative for diarrhea and vomiting.  Endocrine: Positive for cold intolerance and heat intolerance.  Genitourinary:  Positive for urgency. Negative for difficulty urinating.  Musculoskeletal:  Positive for arthralgias, back pain, gait problem, myalgias and neck pain.  Skin:  Positive for color change and rash.  Allergic/Immunologic: Negative for immunocompromised state.  Neurological:  Positive for dizziness, weakness, light-headedness and numbness. Negative for tremors, seizures, syncope, speech difficulty and headaches.  Hematological:  Bruises/bleeds easily.  Psychiatric/Behavioral:  Positive for sleep disturbance. Negative for behavioral problems, confusion and hallucinations. The patient is nervous/anxious.

## 2023-05-31 NOTE — Progress Notes (Signed)
 05/31/2023  Paige Woodward  [id number]  Paige Charlean Amy PA-C, PA-C   Chief complaint:  Patient is here for the following Follow-up .  HPI: History of Present Illness The patient presents for evaluation of right shoulder pain, left leg pain, and facial numbness and tingling.  She has been experiencing persistent pain in her right shoulder, which was evaluated by [employer] a few weeks ago. A follow-up appointment is scheduled for the upcoming week. A CT scan revealed significant arthritis in the shoulder, which was exacerbated by a fall towards the end of 2024. The pain became so severe that she had to manually lift her arm due to weakness. A cortisone injection was administered, providing some relief, although she still experiences discomfort depending on her activities. She reports that her current pain medication is not as effective as it used to be. She has previously tried Celebrex and meloxicam without significant relief. Gabapentin induced a sensation akin to intoxication, rendering her unable to function normally. Lyrica, prescribed following her fibromyalgia diagnosis, caused jitteriness. She has not yet tried Vimpat. She expresses a desire to find a treatment that will alleviate her pain and improve her comfort level.  She also reports constant pain in her left leg, which extends down to the bend of her leg. She has been under the care of [facility] for this issue and has received epidural steroid injections. She has another appointment scheduled with them.  She has been experiencing numbness and tingling on the right side of her face, accompanied by episodes of dropping objects from her hand.  MEDICATIONS Current: clopidogrel , oxycodone  Past: Celebrex, meloxicam, gabapentin, Lyrica    Patient Active Problem List  Diagnosis  . Irritable bowel syndrome  . Fibromyalgia  . Mild acid reflux  . Abdominal pain  . Chest pain  . CAD (coronary artery disease)  .  HTN (hypertension)  . Hypercoagulable state (HCC)  . Leucocytosis  . Old MI (myocardial infarction)  . Chronic pain syndrome  . Cervical radiculopathy at C6  . History of ischemic left MCA stroke  . Stroke-like symptom  . Acute back pain with sciatica, left  . Embolic infarction (HCC)  . Chronic pain of both shoulders     Current Outpatient Medications:  .  acetaminophen  (TYLENOL ) 650 mg ER tablet, Take 1,300 mg by mouth as needed (pain)., Disp: , Rfl:  .  amLODIPine  (NORVASC ) 5 mg tablet, Take 5 mg by mouth daily., Disp: , Rfl:  .  calcium  carbonate (TUMS EX) 300 mg (750 mg) chewable tablet, Take 1,500 mg by mouth., Disp: , Rfl:  .  clopidogreL  (PLAVIX ) 75 mg tablet, TAKE 1 TABLET(75 MG) BY MOUTH DAILY, Disp: 30 tablet, Rfl: 5 .  hydrOXYzine  (VISTARIL ) 25 mg capsule, Take 50 mg by mouth nightly., Disp: , Rfl:  .  isosorbide  mononitrate (IMDUR ) 30 mg 24 hr tablet, , Disp: , Rfl:  .  metoprolol  succinate (TOPROL  XL) 25 mg 24 hr tablet, , Disp: , Rfl:  .  miscellaneous medical supply (C-Tub) misc, TENS Unit (Flex IT or Flex MT Plus) and Supplies (Dx: M54.42,  G89.29) ., Disp: 1 each, Rfl: 0 .  naloxone (NARCAN) 4 mg/actuation spry nasal spray, Administer 1 spray into affected nostril(s) as needed (overdose)., Disp: 1 each, Rfl: 3 .  nitroglycerin  (NITROSTAT ) 0.4 mg SL tablet, Place 0.4 mg under the tongue every 5 (five) minutes as needed., Disp: , Rfl:  .  NON FORMULARY, Pt has CSC with [facility], [place] /  Dr [employer].  (CSC signed 07/12/22)JH, Disp: , Rfl:  .  nortriptyline  (PAMELOR ) 25 mg capsule, Take 2 capsules (50 mg total) by mouth nightly., Disp: 60 capsule, Rfl: 5 .  oxyCODONE  (ROXICODONE ) 15 mg immediate release tablet, Take 1 tablet (15 mg total) by mouth every 6 (six) hours as needed for moderate pain (4-6)., Disp: 110 tablet, Rfl: 0 .  promethazine  (PHENERGAN ) 25 mg tablet, Take 25 mg by mouth every 6 (six) hours as needed for  nausea., Disp: , Rfl: 0 .  rosuvastatin  (CRESTOR ) 5 mg tablet, , Disp: , Rfl:  .  sucralfate  (CARAFATE ) 1 gram tablet, Take 1 g by mouth as needed., Disp: , Rfl:  .  Ventolin  HFA 90 mcg/actuation inhaler, Inhale 2 puffs., Disp: , Rfl:  .  cyclobenzaprine (FLEXERIL) 10 mg tablet, Take 1 tablet (10 mg total) by mouth 3 (three) times a day as needed for muscle spasms., Disp: 90 tablet, Rfl: 5 .  [START ON 06/02/2023] lacosamide (VIMPAT) 100 mg tablet, Take 1 tablet (100 mg total) by mouth 2 (two) times a day for 7 days., Disp: 14 tablet, Rfl: 0 .  lacosamide (VIMPAT) 150 mg tab tablet, Take 1 tablet (150 mg total) by mouth 2 (two) times a day., Disp: 60 tablet, Rfl: 5 .  lacosamide (VIMPAT) 50 mg tablet, Take 1 tablet (50 mg total) by mouth 2 (two) times a day for 7 days., Disp: 14 tablet, Rfl: 0 .  pantoprazole  (PROTONIX ) 40 mg EC tablet, Take 40 mg by mouth 2 (two) times a day before meals for 90 days., Disp: 60 tablet, Rfl: 2  Tobacco Use: High Risk (05/31/2023)   Patient History   . Smoking Tobacco Use: Every Day   . Smokeless Tobacco Use: Never   . Passive Exposure: Not on file    REVIEW OF SYSTEMS:   Review of Systems  Vitals:   05/31/23 1446  BP: 146/88  Pulse: 95  SpO2: 96%   Body mass index is 34 kg/m.  Pain Assessment Pain Score  : 10     PHYSICAL EXAM:   GEN: Well appearing, no apparent distress  NECK: supple  SKIN: Normal.  NEURO:  Alert and Oriented times three, Normal speech and language. Patient gives a coherent history and has a normal train of thought. Recent and remote memory appear to be normal.  Normal attention and concentration. CRANIAL NERVES:  Extraocular motions are intact no diplopia.  Normal facial symmetry and sensation.  Uvula and palate upward and midline.  Sensory normal to light touch throughout  Motor moves all extremities well, no abnormal movements. No evidence of tremor.  GAIT: Normal walking. Gait is normal not wide-based.   Assessment  & Plan 1. Right shoulder pain. The patient reports significant arthritis in the right shoulder, confirmed by a recent CT scan. She received a cortisone injection, which provided some relief. Discussed about peripheral nerve stimulator and radiofrequency neurotomy. Alternative treatments such as a peripheral nerve stimulator or radiofrequency neurotomy were discussed. She was advised to consult with her orthopedist regarding these options. Vimpat (lacosamide) will be initiated at a dosage of 50 mg twice daily, with a weekly increase of 50 mg until a total daily dose of 150 mg is reached. If any adverse effects occur, the medication will be discontinued.  2. Left leg pain. The patient experiences persistent pain in the left leg, which has not been alleviated by current pain medications. A spinal cord stimulator was discussed as a potential treatment option  if other interventions fail. Vimpat (lacosamide) will be initiated at a dosage of 50 mg twice daily, with a weekly increase of 50 mg until a total daily dose of 150 mg is reached. If any adverse effects occur, the medication will be discontinued.  3. Facial numbness and tingling. The patient reports episodes of numbness and tingling on the right side of the face, accompanied by dropping objects. These symptoms could be indicative of various conditions, including carpal tunnel syndrome or sleep disorders.  PROCEDURE The patient received a cortisone injection in the right shoulder a few months ago, which provided some relief.   Diagnoses and all orders for this visit:  Acute back pain with sciatica, left  Chronic pain syndrome  Chronic pain of both shoulders  Other orders -     lacosamide (VIMPAT) 50 mg tablet; Take 1 tablet (50 mg total) by mouth 2 (two) times a day for 7 days. -     lacosamide (VIMPAT) 100 mg tablet; Take 1 tablet (100 mg total) by mouth 2 (two) times a day for 7 days. -     lacosamide (VIMPAT) 150 mg tab tablet; Take 1  tablet (150 mg total) by mouth 2 (two) times a day.      Call or go to ER if any acute changes.  Safety and side effects discussed. All question answered.    I agree the documentation is accurate and complete.  Electronically signed by: Camellia ONEIDA Custard, MD 05/31/2023 3:17 PM

## 2023-06-04 NOTE — Progress Notes (Signed)
 No show  This encounter was created in error - please disregard.

## 2023-06-06 ENCOUNTER — Encounter: Payer: BC Managed Care – PPO | Admitting: Cardiovascular Disease

## 2023-06-08 ENCOUNTER — Encounter: Payer: Self-pay | Admitting: Cardiovascular Disease

## 2023-07-13 ENCOUNTER — Other Ambulatory Visit: Payer: Self-pay | Admitting: Cardiovascular Disease

## 2023-08-28 ENCOUNTER — Other Ambulatory Visit: Payer: Self-pay | Admitting: Cardiovascular Disease

## 2023-08-30 NOTE — Telephone Encounter (Signed)
 Dr. Letta Raw pt. She is passed her 3rd attempt and has noshowed several appts. Does Dr. Floria Hurst want to refill? Please advise.

## 2023-08-31 ENCOUNTER — Encounter (HOSPITAL_COMMUNITY): Payer: Self-pay

## 2023-08-31 ENCOUNTER — Other Ambulatory Visit: Payer: Self-pay

## 2023-08-31 ENCOUNTER — Emergency Department (HOSPITAL_COMMUNITY)
Admission: EM | Admit: 2023-08-31 | Discharge: 2023-09-01 | Disposition: A | Discharge status: Home / Self Care | Attending: Emergency Medicine | Admitting: Emergency Medicine

## 2023-08-31 DIAGNOSIS — F19280 Other psychoactive substance dependence with psychoactive substance-induced anxiety disorder: Secondary | ICD-10-CM | POA: Diagnosis present

## 2023-08-31 DIAGNOSIS — I10 Essential (primary) hypertension: Secondary | ICD-10-CM | POA: Insufficient documentation

## 2023-08-31 DIAGNOSIS — F19980 Other psychoactive substance use, unspecified with psychoactive substance-induced anxiety disorder: Secondary | ICD-10-CM

## 2023-08-31 DIAGNOSIS — I2510 Atherosclerotic heart disease of native coronary artery without angina pectoris: Secondary | ICD-10-CM | POA: Diagnosis not present

## 2023-08-31 DIAGNOSIS — Z79899 Other long term (current) drug therapy: Secondary | ICD-10-CM | POA: Diagnosis not present

## 2023-08-31 LAB — COMPREHENSIVE METABOLIC PANEL WITH GFR
ALT: 12 U/L (ref 0–44)
AST: 16 U/L (ref 15–41)
Albumin: 3.8 g/dL (ref 3.5–5.0)
Alkaline Phosphatase: 112 U/L (ref 38–126)
Anion gap: 9 (ref 5–15)
BUN: 22 mg/dL — ABNORMAL HIGH (ref 6–20)
CO2: 23 mmol/L (ref 22–32)
Calcium: 9.3 mg/dL (ref 8.9–10.3)
Chloride: 104 mmol/L (ref 98–111)
Creatinine, Ser: 1.37 mg/dL — ABNORMAL HIGH (ref 0.44–1.00)
GFR, Estimated: 46 mL/min — ABNORMAL LOW (ref >60)
Glucose, Bld: 110 mg/dL — ABNORMAL HIGH (ref 70–99)
Potassium: 3.7 mmol/L (ref 3.5–5.1)
Sodium: 136 mmol/L (ref 135–145)
Total Bilirubin: 0.4 mg/dL (ref 0.0–1.2)
Total Protein: 7.6 g/dL (ref 6.5–8.1)

## 2023-08-31 LAB — CBC WITH DIFFERENTIAL/PLATELET
Abs Immature Granulocytes: 0.06 10*3/uL (ref 0.00–0.07)
Basophils Absolute: 0.1 10*3/uL (ref 0.0–0.1)
Basophils Relative: 1 %
Eosinophils Absolute: 0.3 10*3/uL (ref 0.0–0.5)
Eosinophils Relative: 2 %
HCT: 36.1 % (ref 36.0–46.0)
Hemoglobin: 12.4 g/dL (ref 12.0–15.0)
Immature Granulocytes: 1 %
Lymphocytes Relative: 16 %
Lymphs Abs: 2.1 10*3/uL (ref 0.7–4.0)
MCH: 29.6 pg (ref 26.0–34.0)
MCHC: 34.3 g/dL (ref 30.0–36.0)
MCV: 86.2 fL (ref 80.0–100.0)
Monocytes Absolute: 0.7 10*3/uL (ref 0.1–1.0)
Monocytes Relative: 6 %
Neutro Abs: 9.8 10*3/uL — ABNORMAL HIGH (ref 1.7–7.7)
Neutrophils Relative %: 74 %
Platelets: 411 10*3/uL — ABNORMAL HIGH (ref 150–400)
RBC: 4.19 MIL/uL (ref 3.87–5.11)
RDW: 13.2 % (ref 11.5–15.5)
WBC: 13.1 10*3/uL — ABNORMAL HIGH (ref 4.0–10.5)
nRBC: 0 % (ref 0.0–0.2)

## 2023-08-31 LAB — RAPID URINE DRUG SCREEN, HOSP PERFORMED
Amphetamines: NOT DETECTED
Barbiturates: NOT DETECTED
Benzodiazepines: NOT DETECTED
Cocaine: NOT DETECTED
Opiates: POSITIVE — AB
Tetrahydrocannabinol: POSITIVE — AB

## 2023-08-31 LAB — ETHANOL: Alcohol, Ethyl (B): <10 mg/dL (ref <10)
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
## 2023-08-31 NOTE — ED Provider Triage Note (Signed)
 Emergency Medicine Provider Triage Evaluation Note  Khianna Blazina , a 53 y.o. female  was evaluated in triage.  Pt complains of paranoia. Patient reports that she ate a burger and it caused her tongue to feel numb immediately after. Is feeling sleepy now. Denies any hallucinations, SI, or HI.  Review of Systems  Positive: paranoia Negative: SI, HI  Physical Exam  BP (!) 153/96   Pulse 92   Temp 98.1 F (36.7 C) (Oral)   Resp 16   Ht 5\' 7"  (1.702 m)   Wt 96.7 kg   SpO2 100%   BMI 33.39 kg/m  Gen:   Awake, no distress   Resp:  Normal effort  MSK:   Moves extremities without difficulty  Other:    Medical Decision Making  Medically screening exam initiated at 10:07 PM.  Appropriate orders placed.  Reina Wilton was informed that the remainder of the evaluation will be completed by another provider, this initial triage assessment does not replace that evaluation, and the importance of remaining in the ED until their evaluation is complete.   Sonnie Dusky, PA-C 08/31/23 2209

## 2023-09-01 NOTE — ED Provider Notes (Signed)
 [place] EMERGENCY DEPARTMENT AT [facility] Provider Note  CSN: [id number] Arrival date & time: 08/31/23 2125  Chief Complaint(s) Paranoid and Anxiety  HPI Paige Woodward is a 53 y.o. female with a past medical history listed below who presented to the emergency departments for feeling of anxiousness and concern for possible foreign food..  Patient reports that she began feeling like this shortly after eating a burger from cookout.  Reports that she had smoked marijuana just prior to this.  Did on any EtOH.  No other illicit drug use.  Patient reported that she felt very anxious inside.  This has slowly improved since being here.  Denied any associated chest pain or shortness of breath.  No nausea or vomiting.  No other physical complaints.  The history is provided by the patient.    Past Medical History Past Medical History:  Diagnosis Date   Bilateral lower extremity edema    Fibromyalgia    GERD (gastroesophageal reflux disease)    History of stroke    Hyperlipidemia    Hypertension    Hypokalemia    MI (myocardial infarction) (HCC)    NSTEMI (non-ST elevated myocardial infarction) (HCC)    Numbness and tingling    Prinzmetal angina (HCC)    Ulcerative colitis (HCC)    Patient Active Problem List   Diagnosis Date Noted   Prinzmetal angina (HCC) 03/21/2022   Hyperlipidemia 03/21/2022   Bilateral lower extremity edema 03/21/2022   Numbness and tingling 03/21/2022   Hypokalemia    Elevated troponin    Non-STEMI (non-ST elevated myocardial infarction) (HCC) 06/21/2021   NSTEMI (non-ST elevated myocardial infarction) (HCC) 06/21/2021   Embolic infarction (HCC) 05/07/2020   Acute back pain with sciatica, left 12/16/2019   Stroke-like symptom 12/14/2019   History of ischemic left MCA stroke 09/03/2019   Cervical radiculopathy at C6 12/25/2018   Chronic pain syndrome 04/05/2018   Hypercoagulable state (HCC) 11/10/2016   Acute ischemic left MCA stroke (HCC)  10/20/2016   TIA (transient ischemic attack) 10/20/2016   Leucocytosis 02/25/2016   CAD (coronary artery disease) 03/20/2015   Essential hypertension 03/20/2015   Old MI (myocardial infarction) 03/20/2015   Abdominal pain 02/15/2011   Chest pain 02/15/2011   Fibromyalgia 02/15/2011   Irritable bowel syndrome 02/15/2011   Mild acid reflux 02/15/2011   Home Medication(s) Prior to Admission medications   Medication Sig Start Date End Date Taking? Authorizing Provider  acetaminophen  (TYLENOL ) 500 MG tablet Take 1,000 mg by mouth daily as needed for headache.    [provider]  acetaminophen  (TYLENOL ) 650 MG CR tablet Take 1,300 mg by mouth every 8 (eight) hours as needed for pain.    [provider]  amLODipine  (NORVASC ) 5 MG tablet Take 1 tablet (5 mg total) by mouth daily. 05/31/23   Nahser, Aleene PARAS, MD  calcium  carbonate (ALKA-SELTZER HEARTBURN) 750 MG chewable tablet Chew 1,500 mg by mouth daily as needed for heartburn.    [provider]  clopidogrel  (PLAVIX ) 75 MG tablet Take 75 mg by mouth at bedtime. 06/14/21   [provider]  hydrOXYzine  (VISTARIL ) 25 MG capsule Take 50 mg by mouth at bedtime. 06/17/21   [provider]  isosorbide  mononitrate (IMDUR ) 30 MG 24 hr tablet TAKE 1/2 TABLET(15 MG) BY MOUTH DAILY 07/13/23   Nahser, Aleene PARAS, MD  metoprolol  succinate (TOPROL  XL) 25 MG 24 hr tablet Take 1 tablet (25 mg total) by mouth daily. 10/27/22   Nahser, Aleene PARAS, MD  nitroGLYCERIN  (  NITROSTAT ) 0.4 MG SL tablet Place 1 tablet (0.4 mg total) under the tongue every 5 (five) minutes as needed for chest pain. 06/24/21   Nahser, Aleene PARAS, MD  nortriptyline  (PAMELOR ) 25 MG capsule Take 50 mg by mouth at bedtime. 06/16/21   [provider]  oxyCODONE  (ROXICODONE ) 15 MG immediate release tablet Take 15 mg by mouth 3 (three) times daily as needed for pain.    [provider]  pantoprazole  (PROTONIX ) 40 MG tablet TAKE 1 TABLET BY MOUTH  DAILY(FURTHER REFILLS BY PCP) 09/20/21   Bhagat, Aleene, PA  rosuvastatin  (CRESTOR ) 10 MG tablet Take 1 tablet (10 mg total) by mouth daily. 03/21/22   Miriam Norris, NP                                                                                                                                    Allergies Ivp dye [iodinated contrast media], Lipitor [atorvastatin], and Penicillins  Review of Systems Review of Systems As noted in HPI  Physical Exam Vital Signs  I have reviewed the triage vital signs BP (!) 144/76 (BP Location: Right Arm)   Pulse 72   Temp 98.5 F (36.9 C) (Oral)   Resp 19   Ht 5' 7 (1.702 m)   Wt 96.7 kg   SpO2 97%   BMI 33.39 kg/m   Physical Exam Vitals reviewed.  Constitutional:      General: She is not in acute distress.    Appearance: She is well-developed. She is not diaphoretic.  HENT:     Head: Normocephalic and atraumatic.     Nose: Nose normal.  Eyes:     General: No scleral icterus.       Right eye: No discharge.        Left eye: No discharge.     Conjunctiva/sclera: Conjunctivae normal.     Pupils: Pupils are equal, round, and reactive to light.  Cardiovascular:     Rate and Rhythm: Normal rate and regular rhythm.     Heart sounds: No murmur heard.    No friction rub. No gallop.  Pulmonary:     Effort: Pulmonary effort is normal. No respiratory distress.     Breath sounds: Normal breath sounds. No stridor. No rales.  Abdominal:     General: There is no distension.     Palpations: Abdomen is soft.     Tenderness: There is no abdominal tenderness.  Musculoskeletal:        General: No tenderness.     Cervical back: Normal range of motion and neck supple.  Skin:    General: Skin is warm and dry.     Findings: No erythema or rash.  Neurological:     Mental Status: She is alert and oriented to person, place, and time.     ED Results and Treatments Labs (all labs ordered are listed, but only abnormal results are  displayed) Labs Reviewed  COMPREHENSIVE METABOLIC PANEL WITH GFR - Abnormal; Notable for the following components:      Result Value   Glucose, Bld 110 (*)    BUN 22 (*)    Creatinine, Ser 1.37 (*)    GFR, Estimated 46 (*)    All other components within normal limits  RAPID URINE DRUG SCREEN, HOSP PERFORMED - Abnormal; Notable for the following components:   Opiates POSITIVE (*)    Tetrahydrocannabinol POSITIVE (*)    All other components within normal limits  CBC WITH DIFFERENTIAL/PLATELET - Abnormal; Notable for the following components:   WBC 13.1 (*)    Platelets 411 (*)    Neutro Abs 9.8 (*)    All other components within normal limits  ETHANOL                                                                                                                         EKG  EKG Interpretation Date/Time:  Thursday August 31 2023 23:45:31 EDT Ventricular Rate:  86 PR Interval:  178 QRS Duration:  92 QT Interval:  376 QTC Calculation: 449 R Axis:   -5  Text Interpretation: Normal sinus rhythm Normal ECG No significant change since last tracing Confirmed by Trine Likes [phone]) on 09/01/2023 7:46:17 AM       Radiology No results found.  Medications Ordered in ED Medications - No data to display Procedures Procedures  (including critical care time) Medical Decision Making / ED Course   Medical Decision Making Amount and/or Complexity of Data Reviewed Labs: ordered. Decision-making details documented in ED Course. ECG/medicine tests: ordered and independent interpretation performed. Decision-making details documented in ED Course.    Patient presented for feeling of anxiety in the setting of potential ingestion.  Symptoms have not resolved.  No associated symptoms concerning for cardiac etiology.  EKG without acute ischemic changes.  Labs reassuring.  Patient has been stable and asymptomatic for several hours.    Final Clinical Impression(s) / ED Diagnoses Final  diagnoses:  Drug-induced anxiety disorder (HCC)   The patient appears reasonably screened and/or stabilized for discharge and I doubt any other medical condition or other [facility] requiring further screening, evaluation, or treatment in the ED at this time. I have discussed the findings, Dx and Tx plan with the patient/family who expressed understanding and agree(s) with the plan. Discharge instructions discussed at length. The patient/family was given strict return precautions who verbalized understanding of the instructions. No further questions at time of discharge.  Disposition: Discharge  Condition: Good  ED Discharge Orders     None         Follow Up: Rosalea Rosina SAILOR, PA [address] [place] [address] [phone]  Call  to schedule an appointment for close follow up    This chart was dictated using voice recognition software.  Despite best efforts to proofread,  errors can occur which can change the documentation meaning.    Trine Likes Moder, MD 09/01/23 [phone]

## 2023-09-29 ENCOUNTER — Ambulatory Visit: Attending: Nurse Practitioner | Admitting: Nurse Practitioner

## 2023-09-29 NOTE — Progress Notes (Deleted)
 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  Past Medical History    Past Medical History:  Diagnosis Date   Bilateral lower extremity edema    Fibromyalgia    GERD (gastroesophageal reflux disease)    History of stroke    Hyperlipidemia    Hypertension    Hypokalemia    MI (myocardial infarction) (HCC)    NSTEMI (non-ST elevated myocardial infarction) (HCC)    Numbness and tingling    Prinzmetal angina (HCC)    Ulcerative colitis (HCC)    Past Surgical History:  Procedure Laterality Date   ABDOMINAL HYSTERECTOMY     APPENDECTOMY     CARDIAC CATHETERIZATION     CHOLECYSTECTOMY     EYE SURGERY     LEFT HEART CATH AND CORONARY ANGIOGRAPHY N/A 06/22/2021   Procedure: LEFT HEART CATH AND CORONARY ANGIOGRAPHY;  Surgeon: Millicent Ally, MD;  Location: [facility];  Service: Cardiovascular;  Laterality: N/A;   LEFT HEART CATH AND CORONARY ANGIOGRAPHY N/A 03/03/2022   Procedure: LEFT HEART CATH AND CORONARY ANGIOGRAPHY;  Surgeon: Avanell Leigh, MD;  Location: [facility];  Service: Cardiovascular;  Laterality: N/A;   SPLENECTOMY      Allergies  Allergies  Allergen Reactions   Ivp Dye [Iodinated Contrast Media] Hives   Lipitor [Atorvastatin] Other (See Comments)    Myalgias   Penicillins Other (See Comments)    Childhood allergy Unknown reaction     Labs/Other Studies Reviewed    The following studies were reviewed today:  Cardiac Studies & Procedures    ______________________________________________________________________________________________ CARDIAC CATHETERIZATION  CARDIAC CATHETERIZATION 03/03/2022  Conclusion Images from the original result were not included.    The left ventricular systolic function is normal.   LV end diastolic pressure is moderately elevated.   The left ventricular ejection fraction is 55-65% by visual estimate.  Paige Woodward is a 53 y.o. female   [id number] LOCATION:  FACILITY: [facility] PHYSICIAN: Lauro Portal, M.D. [birth year]   DATE OF PROCEDURE:  03/03/2022  DATE OF DISCHARGE:     CARDIAC CATHETERIZATION    History obtained from chart review.Paige Woodward is a 53 y.o. female with history of CAD, hypertension, hyperlipidemia, stroke/TIA, fibromyalgia/chronic pain syndrome, possible atrial fibrillation (CVA in 2018 with questionable atrial fibrillation at that time, on Eliquis for few months then discontinued) who is being seen 03/03/2022 for the evaluation of chest pain.  Her EKG was unchanged.  Her troponins rose to 21,000.  She was referred for diagnostic coronary angiography.  She did have a heart catheterization performed in February this year practicality that showed normal coronary arteries.   PROCEDURE DESCRIPTION:  The patient was brought to the second floor [facility] cath lab in the postabsorptive state.  She was premedicated with IV Versed  and fentanyl .  In addition, she got IV Solu-Medrol  and Benadryl  for contrast allergy prophylaxis.  Her right wrist and groin Were prepped and shaved in usual sterile fashion. Xylocaine  1% was used for local anesthesia. A 5 French sheath was inserted into the right common femoral artery using standard Seldinger technique.  I attempted right  radial access unsuccessfully despite using ultrasound guidance.  5 French right and left Judkins diagnostic catheters were used for selective coronary angiography and left ventriculography respectively.   Isovue dye is used for the entirety of the case (40 cc of contrast total to patient).  Retroaortic, left ventricular pullback pressures were recorded.  LVEDP was measured at 29.  I did perform a right common femoral angiogram but I think the the femoral stick was slightly high and therefore I elected not to perform Mynx closure.  Impression Paige Woodward has normal coronary arteries as she did back in February of this year.  Her LV function was normal.  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  The sheath was removed and pressure held in the groin to achieve hemostasis.  The patient left lab in stable condition.  She would benefit from being started on a low-dose calcium  channel blocker.  Lauro Portal. MD, [facility] 03/03/2022 3:46 PM  Findings Coronary Findings Diagnostic  Dominance: Right  No diagnostic findings have been documented. Intervention  No interventions have been documented.   CARDIAC CATHETERIZATION  CARDIAC CATHETERIZATION 06/22/2021  Conclusion Normal epicardial coronary arteries.  Normal LVEDP at 10 mmHg.  RECOMMENDATION: Continued medical therapy for hypertension, hyperlipidemia, and antiplatelet therapy with history of prior stroke.  Findings Coronary Findings Diagnostic  Dominance: Right  No diagnostic findings have been documented. Intervention  No interventions have been documented.     ECHOCARDIOGRAM  ECHOCARDIOGRAM COMPLETE 03/04/2022  Narrative ECHOCARDIOGRAM REPORT    Patient Name:   Paige Woodward Date of Exam: 03/04/2022 Medical Rec #:  [id number]     Height:       67.0 in Accession #:    [id number]    Weight:       210.0 lb Date of Birth:  April 08, 1971     BSA:          2.065 m Patient Age:    51 years      BP:           155/86 mmHg Patient Gender: F             HR:           86 bpm. Exam Location:  Inpatient  Procedure: 2D Echo, 3D Echo, Cardiac Doppler and Color Doppler  Indications:    NSTEMI I21.4  History:         Patient has prior history of Echocardiogram examinations, most recent 03/03/2022. Previous Myocardial Infarction; Risk Factors:Hypertension. GERD.  Sonographer:    Konnie Perry RDCS Referring Phys: JONATHAN J BERRY  IMPRESSIONS   1. Left ventricular ejection fraction, by estimation, is 65 to 70%. The left ventricle has normal function. The left ventricle has no regional wall motion abnormalities. Left ventricular diastolic parameters were normal. 2. Right ventricular systolic function is normal. The right ventricular size is normal. 3. MR is eccentric, directed anterior into LA. Mild mitral valve regurgitation. 4. The aortic valve is normal in structure. Aortic valve regurgitation is not visualized.  FINDINGS Left Ventricle: Left ventricular ejection fraction, by estimation, is 65 to 70%. The left ventricle has normal function. The left ventricle has no regional wall motion abnormalities. The left ventricular internal cavity size was normal in size. There is no left ventricular hypertrophy. Left ventricular diastolic parameters were normal.  Right Ventricle: The right ventricular size is normal. Right vetricular wall thickness was not assessed. Right ventricular systolic function is normal.  Left Atrium: Left atrial size was normal in size.  Right  Atrium: Right atrial size was normal in size.  Pericardium: There is no evidence of pericardial effusion.  Mitral Valve: MR is eccentric, directed anterior into LA. Mild mitral valve regurgitation.  Tricuspid Valve: The tricuspid valve is normal in structure. Tricuspid valve regurgitation is trivial.  Aortic Valve: The aortic valve is normal in structure. Aortic valve regurgitation is not visualized.  Pulmonic Valve: The pulmonic valve was grossly normal. Pulmonic valve regurgitation is not visualized.  Aorta: The aortic root is normal in size and structure.  IAS/Shunts: No atrial level shunt detected by color flow Doppler.   LEFT  VENTRICLE PLAX 2D LVIDd:         4.80 cm   Diastology LVIDs:         2.80 cm   LV e' medial:    7.30 cm/s LV PW:         0.90 cm   LV E/e' medial:  12.7 LV IVS:        0.90 cm   LV e' lateral:   9.86 cm/s LVOT diam:     2.00 cm   LV E/e' lateral: 9.4 LV SV:         94 LV SV Index:   45 LVOT Area:     3.14 cm  3D Volume EF: 3D EF:        61 % LV EDV:       135 ml LV ESV:       52 ml LV SV:        82 ml  RIGHT VENTRICLE RV S prime:     18.60 cm/s TAPSE (M-mode): 3.1 cm  LEFT ATRIUM             Index        RIGHT ATRIUM           Index LA diam:        4.00 cm 1.94 cm/m   RA Area:     13.20 cm LA Vol (A2C):   47.8 ml 23.15 ml/m  RA Volume:   28.30 ml  13.71 ml/m LA Vol (A4C):   43.0 ml 20.83 ml/m LA Biplane Vol: 45.4 ml 21.99 ml/m AORTIC VALVE LVOT Vmax:   152.00 cm/s LVOT Vmean:  90.700 cm/s LVOT VTI:    0.299 m  AORTA Ao Root diam: 2.80 cm  MITRAL VALVE MV Area (PHT): 3.48 cm    SHUNTS MV Decel Time: 218 msec    Systemic VTI:  0.30 m MV E velocity: 92.80 cm/s  Systemic Diam: 2.00 cm MV A velocity: 88.60 cm/s MV E/A ratio:  1.05  Ola Berger MD Electronically signed by Ola Berger MD Signature Date/Time: 03/04/2022/3:48:18 PM    Final    MONITORS  LONG TERM MONITOR (3-14 DAYS) 04/13/2022  Narrative   Predominate rhythm is sinus rhythm including NSR and sinus tachycardia   Rare premature atrial contractions and rare premature ventricular contractions   No serioius arrhythmias observed.   Patch Wear Time:  13 days and 19 hours (2023-10-31T20:43:45-0400 to 2023-11-14T15:16:06-498)  Patient had a min HR of 61 bpm, max HR of 154 bpm, and avg HR of 93 bpm. Predominant underlying rhythm was Sinus Rhythm. Isolated SVEs were rare (<1.0%), SVE Couplets were rare (<1.0%), and no SVE Triplets were present. Isolated VEs were rare (<1.0%), and no VE Couplets or VE Triplets were present.     CARDIAC MRI  MR CARDIAC MORPHOLOGY W WO CONTRAST  03/04/2022  Narrative CLINICAL DATA:  56F with MINOCA  EXAM: CARDIAC MRI  TECHNIQUE: The patient was scanned on a 1.5 Tesla Siemens magnet. A dedicated cardiac coil was used. Functional imaging was done using Fiesta sequences. 2,3, and 4 chamber views were done to assess for RWMA's. Modified Simpson's rule using a short axis stack was used to calculate an ejection fraction on a dedicated work [employer]. The patient received 9.5 cc of Gadavist . After 10 minutes inversion recovery sequences were used to assess for infiltration and scar tissue. Phase contrast velocity mapping was performed.  CONTRAST:  9.5 cc  of Gadavist   FINDINGS: Left ventricle:  -Normal wall thickness  -Normal global systolic function with focal region of hypokinesis in basal inferior wall  -Normal ECV (28%)  -Normal T2 values  -Subendocardial LGE in focal region of basal inferior wall consistent with infarct.  LV EF:  59% (Normal 56-78%)  Absolute volumes:  LV EDV: (Normal 52-141 mL)  LV ESV: 72mL (Normal 13-51 mL)  LV SV: (Normal 33-97 mL)  CO: 6.6L/min (Normal 2.7-6.0 L/min)  Indexed volumes:  LV EDV: 15mL/sq-m (Normal 41-81 mL/sq-m)  LV ESV: 26mL/sq-m (Normal 12-21 mL/sq-m)  LV SV: 13mL/sq-m (Normal 26-56 mL/sq-m)  CI: 3.1L/min/sq-m (Normal 1.8-3.8 L/min/sq-m)  Right ventricle: Normal size and systolic function  RV EF: 63% (Normal 47-80%)  Absolute volumes:  RV EDV: (Normal 58-154 mL)  RV ESV: 58mL (Normal 12-68 mL)  RV SV: (Normal 35-98 mL)  CO: 6.5L/min (Normal 2.7-6 L/min)  Indexed volumes:  RV EDV: 75mL/sq-m (Normal 48-87 mL/sq-m)  RV ESV: 39mL/sq-m (Normal 11-28 mL/sq-m)  RV SV: 68mL/sq-m (Normal 27-57 mL/sq-m)  CI: 3.1L/min/sq-m (Normal 1.8-3.8 L/min/sq-m)  Left atrium: Mild enlargement  Right atrium: Mild enlargement  Mitral valve: No regurgitation  Aortic valve: Tricuspid. Trivial regurgitation  Tricuspid  valve: Trivial regurgitation  Pulmonic valve: No regurgitation  Aorta: Mild dilatation of ascending aorta measuring 38mm  Pericardium: Normal  IMPRESSION: 1. Subendocardial late gadolinium enhancement in focal region of basal inferior wall consistent with infarct.  2.  No evidence of myocarditis  3. Normal LV size, wall thickness, and global systolic function (EF 59%). Focal region of hypokinesis in basal inferior wall  4.  Normal RV size and systolic function (EF 63%)   Electronically Signed By: Carson Clara M.D. 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  She has a history of prior CVA in 2018 with questionable atrial fibrillation at the time.  She was on Eliquis for a few months, this was later discontinued.  She was hospitalized in 06/2021 in the setting of NSTEMI.  Echocardiogram showed preserved LV function.  Cardiac catheterization revealed no significant CAD.  She was hospitalized again in October 2023 in the setting of NSTEMI.  Repeat cardiac catheterization revealed normal coronary arteries, normal LV function.  Cardiac MRI was negative for myocarditis, there was evidence of  basal inferior wall infarct, focal region of hypokinesis and  basal inferior wall, normal LVEF, normal RVEF.  It was noted that her symptoms were possibly related to coronary artery vasospasm.  She was started on amlodipine .  She was last seen in the office on 03/21/2022 and was stable from a cardiac standpoint.  She reported intermittent chest pain.  She was started on Imdur .  She reported chronic bilateral lower extremity edema, numbness and tingling to bilateral lower extremities.  ABIs in 03/2022 were normal.  Cardiac monitor in 03/2022 was normal, no evidence of atrial fibrillation.  She was seen in the ED in April 2025 in the setting of drug-induced anxiety.  UDS was positive for opiates and THC.  She presents today for follow-up.  Since her last visit  Coronary artery vasospasm/chest pain: Hypertension: Hyperlipidemia: Bilateral lower extremity edema: History of CVA: Disposition:  Home Medications    Current Outpatient Medications  Medication Sig Dispense Refill   acetaminophen  (TYLENOL ) 500 MG tablet Take 1,000 mg by mouth daily as needed for headache.     acetaminophen  (TYLENOL ) 650 MG CR tablet Take 1,300 mg by mouth every 8 (eight) hours as needed for pain.     amLODipine  (NORVASC ) 5 MG tablet Take 1 tablet (5 mg total) by mouth daily. 30 tablet 0   calcium  carbonate (ALKA-SELTZER HEARTBURN) 750 MG chewable tablet Chew 1,500 mg by mouth daily as needed for heartburn.     clopidogrel  (PLAVIX ) 75 MG tablet Take 75 mg by mouth at bedtime.     hydrOXYzine  (VISTARIL ) 25 MG capsule Take 50 mg by mouth at bedtime.     isosorbide  mononitrate (IMDUR ) 30 MG 24 hr tablet TAKE 1/2 TABLET(15 MG) BY MOUTH DAILY 7 tablet 0   metoprolol  succinate (TOPROL  XL) 25 MG 24 hr tablet Take 1 tablet (25 mg total) by mouth daily. 90 tablet 1   nitroGLYCERIN  (NITROSTAT ) 0.4 MG SL tablet Place 1 tablet (0.4 mg total) under the tongue every 5 (five) minutes as needed for chest pain. 25 tablet 11   nortriptyline  (PAMELOR ) 25 MG capsule Take 50 mg by mouth at bedtime.      oxyCODONE  (ROXICODONE ) 15 MG immediate release tablet Take 15 mg by mouth 3 (three) times daily as needed for pain.     pantoprazole  (PROTONIX ) 40 MG tablet TAKE 1 TABLET BY MOUTH DAILY(FURTHER REFILLS BY PCP) 30 tablet 0   rosuvastatin  (CRESTOR ) 10 MG tablet Take 1 tablet (10 mg total) by mouth daily. 90 tablet 3   No current facility-administered medications for this visit.     Review of Systems    ***.  All other systems reviewed and are otherwise negative except as noted above.    Physical Exam    VS:  There were no vitals taken for this visit. , BMI There is no height or weight on file to calculate BMI.     GEN: Well nourished, well developed, in no acute distress. HEENT: normal. Neck: Supple, no JVD, carotid bruits, or masses. Cardiac: RRR, no murmurs, rubs, or gallops. No clubbing, cyanosis, edema.  Radials/DP/PT 2+ and equal bilaterally.  Respiratory:  Respirations regular and unlabored, clear to auscultation bilaterally. GI: Soft, nontender, nondistended, BS + x 4. MS: no deformity or atrophy. Skin: warm and dry, no rash. Neuro:  Strength and sensation are intact. Psych: Normal affect.  Accessory Clinical Findings    ECG personally reviewed by me today -    - no acute changes.   Lab Results  Component  Value Date   WBC 13.1 (H) 08/31/2023   HGB 12.4 08/31/2023   HCT 36.1 08/31/2023   MCV 86.2 08/31/2023   PLT 411 (H) 08/31/2023   Lab Results  Component Value Date   CREATININE 1.37 (H) 08/31/2023   BUN 22 (H) 08/31/2023   NA 136 08/31/2023   K 3.7 08/31/2023   CL 104 08/31/2023   CO2 23 08/31/2023   Lab Results  Component Value Date   ALT 12 08/31/2023   AST 16 08/31/2023   ALKPHOS 112 08/31/2023   BILITOT 0.4 08/31/2023   Lab Results  Component Value Date   CHOL 180 06/22/2021   HDL 38 (L) 06/22/2021   LDLCALC 133 (H) 06/22/2021   TRIG 46 06/22/2021   CHOLHDL 4.7 06/22/2021    Lab Results  Component Value Date   HGBA1C 4.8 06/21/2021     Assessment & Plan    1.  ***  No BP recorded.  {Refresh Note OR Click here to enter BP  :1}***   Jude Norton, NP 09/29/2023, 5:29 AM

## 2023-11-29 ENCOUNTER — Other Ambulatory Visit: Payer: Self-pay

## 2023-11-29 ENCOUNTER — Emergency Department (HOSPITAL_COMMUNITY)

## 2023-11-29 ENCOUNTER — Observation Stay (HOSPITAL_COMMUNITY)
Admission: EM | Admit: 2023-11-29 | Discharge: 2023-12-01 | Disposition: A | Discharge status: Home / Self Care | Attending: Internal Medicine | Admitting: Internal Medicine

## 2023-11-29 ENCOUNTER — Encounter (HOSPITAL_COMMUNITY): Payer: Self-pay

## 2023-11-29 DIAGNOSIS — Z79899 Other long term (current) drug therapy: Secondary | ICD-10-CM | POA: Insufficient documentation

## 2023-11-29 DIAGNOSIS — F17210 Nicotine dependence, cigarettes, uncomplicated: Secondary | ICD-10-CM | POA: Diagnosis not present

## 2023-11-29 DIAGNOSIS — I129 Hypertensive chronic kidney disease with stage 1 through stage 4 chronic kidney disease, or unspecified chronic kidney disease: Secondary | ICD-10-CM | POA: Diagnosis not present

## 2023-11-29 DIAGNOSIS — I201 Angina pectoris with documented spasm: Secondary | ICD-10-CM | POA: Diagnosis not present

## 2023-11-29 DIAGNOSIS — Z7902 Long term (current) use of antithrombotics/antiplatelets: Secondary | ICD-10-CM | POA: Insufficient documentation

## 2023-11-29 DIAGNOSIS — G8929 Other chronic pain: Secondary | ICD-10-CM | POA: Diagnosis not present

## 2023-11-29 DIAGNOSIS — I214 Non-ST elevation (NSTEMI) myocardial infarction: Secondary | ICD-10-CM | POA: Diagnosis not present

## 2023-11-29 DIAGNOSIS — Z8673 Personal history of transient ischemic attack (TIA), and cerebral infarction without residual deficits: Secondary | ICD-10-CM | POA: Diagnosis not present

## 2023-11-29 DIAGNOSIS — I4891 Unspecified atrial fibrillation: Secondary | ICD-10-CM | POA: Insufficient documentation

## 2023-11-29 DIAGNOSIS — I10 Essential (primary) hypertension: Secondary | ICD-10-CM | POA: Diagnosis present

## 2023-11-29 DIAGNOSIS — E785 Hyperlipidemia, unspecified: Secondary | ICD-10-CM | POA: Insufficient documentation

## 2023-11-29 DIAGNOSIS — R0789 Other chest pain: Secondary | ICD-10-CM | POA: Diagnosis present

## 2023-11-29 DIAGNOSIS — R079 Chest pain, unspecified: Principal | ICD-10-CM | POA: Diagnosis present

## 2023-11-29 DIAGNOSIS — N1831 Chronic kidney disease, stage 3a: Secondary | ICD-10-CM | POA: Diagnosis not present

## 2023-11-29 LAB — CBC
HCT: 34 % — ABNORMAL LOW (ref 36.0–46.0)
Hemoglobin: 11.8 g/dL — ABNORMAL LOW (ref 12.0–15.0)
MCH: 30.3 pg (ref 26.0–34.0)
MCHC: 34.7 g/dL (ref 30.0–36.0)
MCV: 87.4 fL (ref 80.0–100.0)
Platelets: 463 K/uL — ABNORMAL HIGH (ref 150–400)
RBC: 3.89 MIL/uL (ref 3.87–5.11)
RDW: 13.9 % (ref 11.5–15.5)
WBC: 11 K/uL — ABNORMAL HIGH (ref 4.0–10.5)
nRBC: 0 % (ref 0.0–0.2)

## 2023-11-29 LAB — TROPONIN I (HIGH SENSITIVITY)
Troponin I (High Sensitivity): 14 ng/L (ref <18)
Troponin I (High Sensitivity): 35 ng/L — ABNORMAL HIGH (ref <18)

## 2023-11-29 LAB — BASIC METABOLIC PANEL WITH GFR
Anion gap: 7 (ref 5–15)
BUN: 14 mg/dL (ref 6–20)
CO2: 24 mmol/L (ref 22–32)
Calcium: 9.1 mg/dL (ref 8.9–10.3)
Chloride: 104 mmol/L (ref 98–111)
Creatinine, Ser: 1.13 mg/dL — ABNORMAL HIGH (ref 0.44–1.00)
GFR, Estimated: 58 mL/min — ABNORMAL LOW (ref >60)
Glucose, Bld: 105 mg/dL — ABNORMAL HIGH (ref 70–99)
Potassium: 3.9 mmol/L (ref 3.5–5.1)
Sodium: 135 mmol/L (ref 135–145)

## 2023-11-29 MED ORDER — NITROGLYCERIN 2 % TD OINT
0.5000 [in_us] | TOPICAL_OINTMENT | Freq: Four times a day (QID) | TRANSDERMAL | Status: DC
  Administered 2023-11-29: 0.5 [in_us] via TOPICAL
  Filled 2023-11-29: qty 1

## 2023-11-29 MED ORDER — ASPIRIN 81 MG PO CHEW
324.0000 mg | CHEWABLE_TABLET | Freq: Once | ORAL | Status: AC
  Administered 2023-11-29: 324 mg via ORAL
  Filled 2023-11-29: qty 4

## 2023-11-29 NOTE — ED Triage Notes (Addendum)
 Pt arrived from home via [facility] c/o chest pain 4/10 that began approx 45 min ago described as squeezing with radiating left arm pain. Pt refused ASA per EMS d/t it makes her stomach hurt. Pt was adm SL nitroglycerinx 1 via EMS enroute

## 2023-11-29 NOTE — ED Provider Notes (Signed)
 [place] EMERGENCY DEPARTMENT AT [facility] Provider Note   CSN: [id number] Arrival date & time: 11/29/23  8076     Patient presents with: Chest Pain   Paige Woodward is a 53 y.o. female.   53 year old female presents with chest pain which began today at rest.  Pain did not radiate to her left arm.  No radiation between her shoulder blades states that she has a history of angina and that this feels similar.  Notes that has been waxing and waning.  It has not been associate with dyspnea or diaphoresis.  Called EMS and was given nitroglycerin  which she said improved her symptoms greatly.  She states that she has been much chest pain-free at this point       Prior to Admission medications   Medication Sig Start Date End Date Taking? Authorizing Provider  acetaminophen  (TYLENOL ) 500 MG tablet Take 1,000 mg by mouth daily as needed for headache.    [provider]  acetaminophen  (TYLENOL ) 650 MG CR tablet Take 1,300 mg by mouth every 8 (eight) hours as needed for pain.    [provider]  amLODipine  (NORVASC ) 5 MG tablet Take 1 tablet (5 mg total) by mouth daily. 05/31/23   Nahser, Aleene PARAS, MD  calcium  carbonate (ALKA-SELTZER HEARTBURN) 750 MG chewable tablet Chew 1,500 mg by mouth daily as needed for heartburn.    [provider]  clopidogrel  (PLAVIX ) 75 MG tablet Take 75 mg by mouth at bedtime. 06/14/21   [provider]  hydrOXYzine  (VISTARIL ) 25 MG capsule Take 50 mg by mouth at bedtime. 06/17/21   [provider]  isosorbide  mononitrate (IMDUR ) 30 MG 24 hr tablet TAKE 1/2 TABLET(15 MG) BY MOUTH DAILY 07/13/23   Nahser, Aleene PARAS, MD  metoprolol  succinate (TOPROL  XL) 25 MG 24 hr tablet Take 1 tablet (25 mg total) by mouth daily. 10/27/22   Nahser, Aleene PARAS, MD  nitroGLYCERIN  (NITROSTAT ) 0.4 MG SL tablet Place 1 tablet (0.4 mg total) under the tongue every 5 (five) minutes as needed for chest pain. 06/24/21   Nahser, Aleene PARAS, MD   nortriptyline  (PAMELOR ) 25 MG capsule Take 50 mg by mouth at bedtime. 06/16/21   [provider]  oxyCODONE  (ROXICODONE ) 15 MG immediate release tablet Take 15 mg by mouth 3 (three) times daily as needed for pain.    [provider]  pantoprazole  (PROTONIX ) 40 MG tablet TAKE 1 TABLET BY MOUTH DAILY(FURTHER REFILLS BY PCP) 09/20/21   Bhagat, Aleene, PA  rosuvastatin  (CRESTOR ) 10 MG tablet Take 1 tablet (10 mg total) by mouth daily. 03/21/22   Miriam Norris, NP    Allergies: Ivp dye [iodinated contrast media], Lipitor [atorvastatin], and Penicillins    Review of Systems  All other systems reviewed and are negative.   Updated Vital Signs BP (!) 158/97 (BP Location: Left Arm)   Pulse 73   Temp 97.9 F (36.6 C)   Resp 16   Ht 1.702 m (5' 7)   Wt 96.2 kg   SpO2 99%   BMI 33.20 kg/m   Physical Exam Vitals and nursing note reviewed.  Constitutional:      General: She is not in acute distress.    Appearance: Normal appearance. She is well-developed. She is not toxic-appearing.  HENT:     Head: Normocephalic and atraumatic.  Eyes:     General: Lids are normal.     Conjunctiva/sclera: Conjunctivae normal.     Pupils: Pupils are equal, round, and reactive to  light.  Neck:     Thyroid: No thyroid mass.     Trachea: No tracheal deviation.  Cardiovascular:     Rate and Rhythm: Normal rate and regular rhythm.     Heart sounds: Normal heart sounds. No murmur heard.    No gallop.  Pulmonary:     Effort: Pulmonary effort is normal. No respiratory distress.     Breath sounds: Normal breath sounds. No stridor. No decreased breath sounds, wheezing, rhonchi or rales.  Abdominal:     General: There is no distension.     Palpations: Abdomen is soft.     Tenderness: There is no abdominal tenderness. There is no rebound.  Musculoskeletal:        General: No tenderness. Normal range of motion.     Cervical back: Normal range of motion and neck supple.  Skin:     General: Skin is warm and dry.     Findings: No abrasion or rash.  Neurological:     Mental Status: She is alert and oriented to person, place, and time. Mental status is at baseline.     GCS: GCS eye subscore is 4. GCS verbal subscore is 5. GCS motor subscore is 6.     Cranial Nerves: No cranial nerve deficit.     Sensory: No sensory deficit.     Motor: Motor function is intact.  Psychiatric:        Attention and Perception: Attention normal.        Speech: Speech normal.        Behavior: Behavior normal.     (all labs ordered are listed, but only abnormal results are displayed) Labs Reviewed  CBC - Abnormal; Notable for the following components:      Result Value   WBC 11.0 (*)    Hemoglobin 11.8 (*)    HCT 34.0 (*)    Platelets 463 (*)    All other components within normal limits  BASIC METABOLIC PANEL WITH GFR  TROPONIN I (HIGH SENSITIVITY)    EKG: EKG Interpretation Date/Time:  Wednesday November 29 2023 19:30:07 EDT Ventricular Rate:  73 PR Interval:  168 QRS Duration:  88 QT Interval:  372 QTC Calculation: 409 R Axis:   -10  Text Interpretation: Normal sinus rhythm Minimal voltage criteria for LVH, may be normal variant ( Cornell product ) Borderline ECG When compared with ECG of 31-Aug-2023 23:45, PREVIOUS ECG IS PRESENT Confirmed by Dasie Faden ([id number]) on 11/29/2023 7:37:00 PM  Radiology: No results found.   Procedures   Medications Ordered in the ED - No data to display                                  Medical Decision Making Amount and/or Complexity of Data Reviewed Labs: ordered. Radiology: ordered.   Patient's EKG shows normal sinus rhythm.  Patient is pain-free at this time.  First troponin here negative.  Chest x-ray shows no acute findings.  Given patient's cardiac history as well as pain relieved nitroglycerin  she will need to be observed overnight.  Will consult hospitalist team     Final diagnoses:  None    ED Discharge Orders      None          Dasie Faden, MD 11/29/23 2125

## 2023-11-30 ENCOUNTER — Observation Stay (HOSPITAL_COMMUNITY)

## 2023-11-30 DIAGNOSIS — I259 Chronic ischemic heart disease, unspecified: Secondary | ICD-10-CM | POA: Diagnosis not present

## 2023-11-30 DIAGNOSIS — I10 Essential (primary) hypertension: Secondary | ICD-10-CM | POA: Diagnosis not present

## 2023-11-30 DIAGNOSIS — I214 Non-ST elevation (NSTEMI) myocardial infarction: Secondary | ICD-10-CM

## 2023-11-30 DIAGNOSIS — R079 Chest pain, unspecified: Secondary | ICD-10-CM | POA: Diagnosis not present

## 2023-11-30 DIAGNOSIS — R7989 Other specified abnormal findings of blood chemistry: Secondary | ICD-10-CM

## 2023-11-30 DIAGNOSIS — I201 Angina pectoris with documented spasm: Secondary | ICD-10-CM | POA: Diagnosis not present

## 2023-11-30 LAB — LIPID PANEL
Cholesterol: 147 mg/dL (ref 0–200)
HDL: 34 mg/dL — ABNORMAL LOW (ref >40)
LDL Cholesterol: 95 mg/dL (ref 0–99)
Total CHOL/HDL Ratio: 4.3 ratio
Triglycerides: 89 mg/dL (ref <150)
VLDL: 18 mg/dL (ref 0–40)

## 2023-11-30 LAB — CBC
HCT: 33.4 % — ABNORMAL LOW (ref 36.0–46.0)
HCT: 34.5 % — ABNORMAL LOW (ref 36.0–46.0)
Hemoglobin: 11.5 g/dL — ABNORMAL LOW (ref 12.0–15.0)
Hemoglobin: 12.2 g/dL (ref 12.0–15.0)
MCH: 30.1 pg (ref 26.0–34.0)
MCH: 30.1 pg (ref 26.0–34.0)
MCHC: 34.4 g/dL (ref 30.0–36.0)
MCHC: 35.4 g/dL (ref 30.0–36.0)
MCV: 87.4 fL (ref 80.0–100.0)
MCV: 85.2 fL (ref 80.0–100.0)
Platelets: 411 K/uL — ABNORMAL HIGH (ref 150–400)
Platelets: 472 K/uL — ABNORMAL HIGH (ref 150–400)
RBC: 3.82 MIL/uL — ABNORMAL LOW (ref 3.87–5.11)
RBC: 4.05 MIL/uL (ref 3.87–5.11)
RDW: 13.6 % (ref 11.5–15.5)
RDW: 13.6 % (ref 11.5–15.5)
WBC: 12.3 K/uL — ABNORMAL HIGH (ref 4.0–10.5)
WBC: 10.7 K/uL — ABNORMAL HIGH (ref 4.0–10.5)
nRBC: 0 % (ref 0.0–0.2)
nRBC: 0 % (ref 0.0–0.2)

## 2023-11-30 LAB — BASIC METABOLIC PANEL WITH GFR
Anion gap: 8 (ref 5–15)
BUN: 11 mg/dL (ref 6–20)
CO2: 22 mmol/L (ref 22–32)
Calcium: 9.3 mg/dL (ref 8.9–10.3)
Chloride: 105 mmol/L (ref 98–111)
Creatinine, Ser: 0.99 mg/dL (ref 0.44–1.00)
GFR, Estimated: >60 mL/min (ref >60)
Glucose, Bld: 108 mg/dL — ABNORMAL HIGH (ref 70–99)
Potassium: 3.6 mmol/L (ref 3.5–5.1)
Sodium: 135 mmol/L (ref 135–145)

## 2023-11-30 LAB — RAPID URINE DRUG SCREEN, HOSP PERFORMED
Amphetamines: NOT DETECTED
Barbiturates: NOT DETECTED
Benzodiazepines: NOT DETECTED
Cocaine: NOT DETECTED
Opiates: NOT DETECTED
Tetrahydrocannabinol: POSITIVE — AB

## 2023-11-30 LAB — HEMOGLOBIN A1C
Hgb A1c MFr Bld: 4.7 % — ABNORMAL LOW (ref 4.8–5.6)
Mean Plasma Glucose: 88.19 mg/dL

## 2023-11-30 LAB — TROPONIN I (HIGH SENSITIVITY)
Troponin I (High Sensitivity): 381 ng/L (ref <18)
Troponin I (High Sensitivity): 3690 ng/L (ref <18)

## 2023-11-30 LAB — ECHOCARDIOGRAM COMPLETE
AV Mean grad: 4 mmHg
AV Peak grad: 7.1 mmHg
Ao pk vel: 1.33 m/s
Area-P 1/2: 3.53 cm2
Height: 67 in
MV M vel: 2.78 m/s
MV Peak grad: 30.9 mmHg
S' Lateral: 3.4 cm
Weight: 3392 [oz_av]

## 2023-11-30 LAB — D-DIMER, QUANTITATIVE: D-Dimer, Quant: <0.27 ug{FEU}/mL (ref 0.00–0.50)

## 2023-11-30 LAB — HEPARIN LEVEL (UNFRACTIONATED)
Heparin Unfractionated: 0.55 [IU]/mL (ref 0.30–0.70)
Heparin Unfractionated: 0.58 [IU]/mL (ref 0.30–0.70)

## 2023-11-30 LAB — HIV ANTIBODY (ROUTINE TESTING W REFLEX): HIV Screen 4th Generation wRfx: NONREACTIVE

## 2023-11-30 LAB — MAGNESIUM: Magnesium: 1.9 mg/dL (ref 1.7–2.4)

## 2023-11-30 LAB — PHOSPHORUS: Phosphorus: 3.5 mg/dL (ref 2.5–4.6)

## 2023-11-30 MED ORDER — PANTOPRAZOLE SODIUM 40 MG PO TBEC
40.0000 mg | DELAYED_RELEASE_TABLET | Freq: Every day | ORAL | Status: DC
  Administered 2023-11-30 – 2023-12-01 (×2): 40 mg via ORAL
  Filled 2023-11-30 (×2): qty 1

## 2023-11-30 MED ORDER — HYDROXYZINE HCL 10 MG PO TABS: 50.0000 mg | ORAL_TABLET | Freq: Every evening | ORAL | Status: DC | PRN

## 2023-11-30 MED ORDER — OXYCODONE HCL 5 MG PO TABS
15.0000 mg | ORAL_TABLET | Freq: Three times a day (TID) | ORAL | Status: DC | PRN
  Administered 2023-11-30 – 2023-12-01 (×4): 15 mg via ORAL
  Filled 2023-11-30 (×4): qty 3

## 2023-11-30 MED ORDER — HEPARIN BOLUS VIA INFUSION
4000.0000 [IU] | Freq: Once | INTRAVENOUS | Status: AC
  Administered 2023-11-30: 4000 [IU] via INTRAVENOUS
  Filled 2023-11-30: qty 4000

## 2023-11-30 MED ORDER — ONDANSETRON HCL 4 MG/2ML IJ SOLN: 4.0000 mg | Freq: Four times a day (QID) | INTRAMUSCULAR | Status: DC | PRN

## 2023-11-30 MED ORDER — POLYETHYLENE GLYCOL 3350 17 G PO PACK: 17.0000 g | PACK | Freq: Every day | ORAL | Status: DC | PRN

## 2023-11-30 MED ORDER — ALBUTEROL SULFATE (2.5 MG/3ML) 0.083% IN NEBU: 2.5000 mg | INHALATION_SOLUTION | RESPIRATORY_TRACT | Status: DC | PRN

## 2023-11-30 MED ORDER — SODIUM CHLORIDE 0.9 % IV SOLN
25.0000 mg | Freq: Four times a day (QID) | INTRAVENOUS | Status: DC | PRN
  Administered 2023-12-01: 25 mg via INTRAVENOUS
  Filled 2023-11-30: qty 1

## 2023-11-30 MED ORDER — ENOXAPARIN SODIUM 40 MG/0.4ML IJ SOSY
40.0000 mg | PREFILLED_SYRINGE | Freq: Every day | INTRAMUSCULAR | Status: DC
  Filled 2023-11-30: qty 0.4

## 2023-11-30 MED ORDER — ISOSORBIDE MONONITRATE ER 30 MG PO TB24
30.0000 mg | ORAL_TABLET | Freq: Every day | ORAL | Status: DC
  Administered 2023-11-30 – 2023-12-01 (×2): 30 mg via ORAL
  Filled 2023-11-30 (×2): qty 1

## 2023-11-30 MED ORDER — HEPARIN (PORCINE) 25000 UT/250ML-% IV SOLN
1300.0000 [IU]/h | INTRAVENOUS | Status: DC
  Administered 2023-11-30 (×2): 1350 [IU]/h via INTRAVENOUS
  Filled 2023-11-30 (×2): qty 250

## 2023-11-30 MED ORDER — ACETAMINOPHEN 500 MG PO TABS
1000.0000 mg | ORAL_TABLET | Freq: Four times a day (QID) | ORAL | Status: DC | PRN
  Administered 2023-11-30: 1000 mg via ORAL
  Filled 2023-11-30: qty 2

## 2023-11-30 MED ORDER — NITROGLYCERIN 0.4 MG SL SUBL: 0.4000 mg | SUBLINGUAL_TABLET | SUBLINGUAL | Status: DC | PRN

## 2023-11-30 MED ORDER — CLOPIDOGREL BISULFATE 75 MG PO TABS
75.0000 mg | ORAL_TABLET | Freq: Every day | ORAL | Status: DC
  Administered 2023-11-30 (×2): 75 mg via ORAL
  Filled 2023-11-30 (×2): qty 1

## 2023-11-30 MED ORDER — ISOSORBIDE MONONITRATE ER 30 MG PO TB24: 15.0000 mg | ORAL_TABLET | Freq: Every day | ORAL | Status: DC

## 2023-11-30 MED ORDER — MELATONIN 3 MG PO TABS: 6.0000 mg | ORAL_TABLET | Freq: Every evening | ORAL | Status: DC | PRN

## 2023-11-30 MED ORDER — METOPROLOL SUCCINATE ER 50 MG PO TB24
50.0000 mg | ORAL_TABLET | Freq: Every day | ORAL | Status: DC
  Administered 2023-11-30 – 2023-12-01 (×2): 50 mg via ORAL
  Filled 2023-11-30: qty 2
  Filled 2023-11-30: qty 1

## 2023-11-30 MED ORDER — AMLODIPINE BESYLATE 10 MG PO TABS
10.0000 mg | ORAL_TABLET | Freq: Every day | ORAL | Status: DC
  Administered 2023-11-30 – 2023-12-01 (×2): 10 mg via ORAL
  Filled 2023-11-30: qty 2
  Filled 2023-11-30: qty 1

## 2023-11-30 MED ORDER — NORTRIPTYLINE HCL 25 MG PO CAPS
50.0000 mg | ORAL_CAPSULE | Freq: Every day | ORAL | Status: DC
  Administered 2023-11-30 (×2): 50 mg via ORAL
  Filled 2023-11-30 (×3): qty 2

## 2023-11-30 MED ORDER — SODIUM CHLORIDE 0.9% FLUSH
3.0000 mL | Freq: Two times a day (BID) | INTRAVENOUS | Status: DC
  Administered 2023-11-30 – 2023-12-01 (×2): 3 mL via INTRAVENOUS

## 2023-11-30 NOTE — Progress Notes (Signed)
  Progress Note  Patient Name: Paige Woodward Date of Encounter: 11/30/2023 [facility] Cardiologist: Aleene Passe, MD   Interval Summary    No further episodes of chest pain this morning.  Actually ambulated to the bathroom and felt well.  Vital Signs Vitals:   11/30/23 0520 11/30/23 0525 11/30/23 0725 11/30/23 0725  BP:   (!) 153/102   Pulse: 65 71 75   Resp: 17 19 (!) 30   Temp:    97.9 F (36.6 C)  TempSrc:    Oral  SpO2: 100% 100% 100%   Weight:      Height:       No intake or output data in the 24 hours ending 11/30/23 0747    11/29/2023    7:28 PM 08/31/2023    9:41 PM 03/21/2022   10:52 AM  Last 3 Weights  Weight (lbs) 212 lb 213 lb 3 oz 213 lb 3.2 oz  Weight (kg) 96.163 kg 96.7 kg 96.707 kg      Telemetry/ECG   Sinus rhythm with occasional PVCs- Personally Reviewed  Physical Exam  GEN: No acute distress.   Neck: No JVD Cardiac: RRR, no murmurs, rubs, or gallops.  Respiratory: Clear to auscultation bilaterally. GI: Soft, nontender, non-distended  MS: No edema  Assessment & Plan   53 y.o. 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 Keturah.   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  -- presented with recurrent chest pain and hsTn 14>> 35>> 381.  EKG shows sinus rhythm with no acute ST/T wave abnormalities. -- Previously had been on Imdur  though reports she has been out of this for quite some time. -- Currently on IV heparin , aspirin , metoprolol  XL 50 mg daily, norvasc  10mg  daily with Imdur  15 mg resumed on admission.  Will increase Imdur  to 30 mg daily. -- Echocardiogram pending -- Suspect symptoms secondary to coronary vasospasm, though will follow-up on  echocardiogram results to determine any further testing.  Hypertension -- Elevated readings while in the ED. reports she has been compliant with her home antihypertensives and was actually just at her PCP yesterday with readings in the 130/80 range. -- On Norvasc  10 mg daily, metoprolol  XL 50 mg daily with Imdur  30mg  daily  Hyperlipidemia -- LDL 95, HDL 34 -- denies being on statin PTA 2/2 myalgias, normal coronaries on cath 2023  History of CVA -- On Plavix , not on statin secondary to history of myalgias  For questions or updates, please contact [facility] Please consult [email or web address] for contact info under       Signed, Manuelita Rummer, NP

## 2023-11-30 NOTE — Plan of Care (Signed)
  Problem: Health Behavior/Discharge Planning: Goal: Ability to manage health-related needs will improve Outcome: Progressing   Problem: Clinical Measurements: Goal: Cardiovascular complication will be avoided Outcome: Progressing   Problem: Coping: Goal: Level of anxiety will decrease Outcome: Progressing   Problem: Pain Managment: Goal: General experience of comfort will improve and/or be controlled Outcome: Progressing

## 2023-11-30 NOTE — ED Notes (Signed)
 Nitroglycerin  paste removed at this time per patient's request due to causing headache.

## 2023-11-30 NOTE — Consult Note (Signed)
 Cardiology Consultation   Patient ID: Paige Woodward MRN: [id number]; DOB: 1970-10-01  Admit date: 11/29/2023 Date of Consult: 11/30/2023  PCP:  Rosalea Rosina SAILOR, PA   [facility] Providers Cardiologist:  Aleene Passe, MD        Patient Profile: Paige Woodward is a 53 y.o. 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 Keturah.  History of Present Illness: [name] unfortunately has experienced multiple NSTEMI's dating back to 2023.  During each of those presentations she presented with chest pain and was found to have markedly elevated troponins in the thousands.  She initially underwent LHC in 2/23 and was found to have normal epicardial coronary arteries.  She presented once more in 10/23 with an NSTEMI and underwent LHC which similarly showed no epicardial CAD.  At that point it was presumed her NSTEMI's were secondary to coronary vasospasm.  She also underwent cMRI which showed subendocardial ischemia in RCA distribution without evidence of myocarditis.  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  She reports that she has not taking her Imdur  for several months due to running out of her prescription.  Yesterday the patient was in her usual state of health when at approximately 6 PM she developed chest pain while talking on the phone.  She described the chest pain as being a dull pressure in the center of her chest with some associated LUE pain.  She states that this chest pain is similar to her prior MI pain.  She called EMS and was brought in for evaluation.  She denies tobacco, alcohol, illicit drug use.  She lives in [place] with her daughter.  In the ED her VS were afebrile, HR 73, BP 158/97, RR 16 and satting at 99% on RA.  Labs notable  for WBC 11, platelets 463, and troponins 14 -> 35 ->381. ECG showed NSR without ischemic changes.  CXR showed mild bibasilar atelectasis without cardiomegaly.  She was loaded with aspirin  in the ED.  Cardiology consulted for evaluation.   Past Medical History:  Diagnosis Date   Bilateral lower extremity edema    Fibromyalgia    GERD (gastroesophageal reflux disease)    History of stroke    Hyperlipidemia    Hypertension    Hypokalemia    MI (myocardial infarction) (HCC)    NSTEMI (non-ST elevated myocardial infarction) (HCC)    Numbness and tingling    Prinzmetal angina (HCC)    Ulcerative colitis (HCC)     Past Surgical History:  Procedure Laterality Date   ABDOMINAL HYSTERECTOMY     APPENDECTOMY     CARDIAC CATHETERIZATION     CHOLECYSTECTOMY     EYE SURGERY     LEFT HEART CATH AND CORONARY ANGIOGRAPHY N/A 06/22/2021   Procedure: LEFT HEART CATH AND CORONARY ANGIOGRAPHY;  Surgeon: Burnard Debby LABOR, MD;  Location: [facility]VAS[facility];  Service: Cardiovascular;  Laterality: N/A;   LEFT HEART CATH AND CORONARY ANGIOGRAPHY N/A 03/03/2022   Procedure: LEFT HEART CATH AND CORONARY ANGIOGRAPHY;  Surgeon: Court Dorn PARAS, MD;  Location: [facility]VAS[facility] CV [facility];  Service: Cardiovascular;  Laterality: N/A;   SPLENECTOMY       Home Medications:  Prior to Admission medications   Medication Sig Start Date End Date Taking? Authorizing Provider  acetaminophen  (TYLENOL ) 650 MG CR tablet Take 1,300 mg by mouth every 8 (eight) hours as needed for pain.   Yes [provider]  amLODipine  (NORVASC ) 10 MG tablet Take 10 mg by mouth daily. 11/28/23  Yes [provider]  calcium  carbonate (ALKA-SELTZER HEARTBURN) 750 MG chewable tablet Chew 1,500 mg by mouth daily as needed for heartburn.   Yes [provider]  clopidogrel  (PLAVIX ) 75 MG tablet Take 75 mg by mouth at bedtime. 06/14/21  Yes [provider]  hydrOXYzine  (VISTARIL ) 25 MG capsule Take 50 mg by mouth at  bedtime. 06/17/21  Yes [provider]  metoprolol  succinate (TOPROL -XL) 50 MG 24 hr tablet Take 50 mg by mouth daily. 11/10/23  Yes [provider]  nitroGLYCERIN  (NITROSTAT ) 0.4 MG SL tablet Place 1 tablet (0.4 mg total) under the tongue every 5 (five) minutes as needed for chest pain. 06/24/21  Yes Nahser, Aleene PARAS, MD  nortriptyline  (PAMELOR ) 25 MG capsule Take 50 mg by mouth at bedtime. 06/16/21  Yes [provider]  oxyCODONE  (ROXICODONE ) 15 MG immediate release tablet Take 15 mg by mouth 3 (three) times daily as needed for pain.   Yes [provider]  pantoprazole  (PROTONIX ) 40 MG tablet TAKE 1 TABLET BY MOUTH DAILY(FURTHER REFILLS BY PCP) 09/20/21  Yes Bhagat, Bhavinkumar, PA  isosorbide  mononitrate (IMDUR ) 30 MG 24 hr tablet TAKE 1/2 TABLET(15 MG) BY MOUTH DAILY 07/13/23   Nahser, Aleene PARAS, MD    Scheduled Meds:  amLODipine   10 mg Oral Daily   clopidogrel   75 mg Oral QHS   enoxaparin  (LOVENOX ) injection  40 mg Subcutaneous QHS   isosorbide  mononitrate  15 mg Oral Daily   metoprolol  succinate  50 mg Oral Daily   nitroGLYCERIN   0.5 inch Topical Q6H   nortriptyline   50 mg Oral QHS   pantoprazole   40 mg Oral Daily   sodium chloride  flush  3 mL Intravenous Q12H   Continuous Infusions:  PRN Meds: acetaminophen , albuterol , hydrOXYzine , melatonin, ondansetron  (ZOFRAN ) IV, oxyCODONE , polyethylene glycol  Allergies:    Allergies  Allergen Reactions   Ivp Dye [Iodinated Contrast Media] Hives   Lipitor [Atorvastatin] Other (See Comments)    Myalgias   Penicillins Other (See Comments)    Childhood allergy Unknown reaction    Social History:   Social History   Socioeconomic History   Marital status: Married    Spouse name: Not on file   Number of children: Not on file   Years of education: Not on file   Highest education level: Not on file  Occupational History   Not on file  Tobacco Use   Smoking status: Every Day    Types: Cigarettes    Smokeless tobacco: Never  Vaping Use   Vaping status: Never Used  Substance and Sexual Activity   Alcohol use: No   Drug use: No   Sexual activity: Not on file  Other Topics Concern   Not on file  Social History Narrative   Not on file   Social Drivers of Health   Financial Resource Strain: Medium Risk (08/04/2021)   Overall Financial Resource Strain (CARDIA)    Difficulty of Paying Living Expenses: Somewhat hard  Food Insecurity: No Food Insecurity (08/04/2021)   Hunger Vital Sign    Worried About Running Out of Food in the Last Year: Never true    Ran Out of Food in the Last Year: Never true  Transportation Needs: No Transportation Needs (08/04/2021)   PRAPARE - Transportation  Lack of Transportation (Medical): No    Lack of Transportation (Non-Medical): No  Physical Activity: Not on file  Stress: Not on file  Social Connections: Not on file  Intimate Partner Violence: Not on file    Family History:   Family History  Problem Relation Age of Onset   Stroke Father      ROS:  Please see the history of present illness.  All other ROS reviewed and negative.     Physical Exam/Data: Vitals:   11/29/23 1928 11/29/23 2030 11/29/23 2330 11/29/23 2345  BP:  129/88  137/69  Pulse:  72  77  Resp:  16  10  Temp:   97.7 F (36.5 C)   TempSrc:   Oral   SpO2:  100%  99%  Weight: 96.2 kg     Height: 5' 7 (1.702 m)      No intake or output data in the 24 hours ending 11/30/23 0154    11/29/2023    7:28 PM 08/31/2023    9:41 PM 03/21/2022   10:52 AM  Last 3 Weights  Weight (lbs) 212 lb 213 lb 3 oz 213 lb 3.2 oz  Weight (kg) 96.163 kg 96.7 kg 96.707 kg     Body mass index is 33.2 kg/m.  General:  Well nourished, well developed, in no acute distress, very pleasant HEENT: Atraumatic, normocephalic Neck: no JVD Vascular: No carotid bruits; radial pulses 2+ bilaterally Cardiac:  normal S1, S2; RRR; no murmur, rubs or gallops Lungs:  clear to auscultation bilaterally, no  wheezing, rhonchi or rales  Abd: soft, nontender, nondistended Ext: Trace bilateral pitting edema to shins Musculoskeletal:  No deformities, BUE and BLE strength normal and equal Skin: warm and dry  Neuro:  CNs 2-12 intact, no focal abnormalities noted Psych:  Normal affect   EKG:  The EKG was personally reviewed and demonstrates: NSR     Telemetry:  Telemetry was personally reviewed and demonstrates: NSR  Relevant CV Studies:  03/04/22:  IMPRESSION: 1. Subendocardial late gadolinium enhancement in focal region of basal inferior wall consistent with infarct.   2.  No evidence of myocarditis   3. Normal LV size, wall thickness, and global systolic function (EF 59%). Focal region of hypokinesis in basal inferior wall   4. Normal RV size and systolic function (EF 63%)   LHC 03/03/22:  IMPRESSION: Ms. Lovins has normal coronary arteries as she did back in February of this year.  Her LV function was normal.  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  The sheath was removed and pressure held in the groin to achieve hemostasis.  The patient left lab in stable condition.  She would benefit from being started on a low-dose calcium  channel blocker.    LHC 06/22/21:  Normal epicardial coronary arteries.   Normal LVEDP at 10 mmHg.   RECOMMENDATION: Continued medical therapy for hypertension, hyperlipidemia, and antiplatelet therapy with history of prior stroke.  Laboratory Data: High Sensitivity Troponin:   Recent Labs  Lab 11/29/23 1940 11/29/23 2215  TROPONINIHS 14 35*     Chemistry Recent Labs  Lab 11/29/23 1940  NA 135  K 3.9  CL 104  CO2 24  GLUCOSE 105*  BUN 14  CREATININE 1.13*  CALCIUM  9.1  GFRNONAA 58*  ANIONGAP 7    No results for input(s): PROT, ALBUMIN, AST, ALT, ALKPHOS, BILITOT in the last 168 hours. Lipids No results for input(s): CHOL, TRIG, HDL, LABVLDL, LDLCALC, CHOLHDL in the last 168 hours.  Hematology Recent Labs  Lab 11/29/23 1940  WBC 11.0*  RBC 3.89  HGB 11.8*  HCT 34.0*  MCV 87.4  MCH 30.3  MCHC 34.7  RDW 13.9  PLT 463*   Thyroid No results for input(s): TSH, FREET4 in the last 168 hours.  BNPNo results for input(s): BNP, PROBNP in the last 168 hours.  DDimer No results for input(s): DDIMER in the last 168 hours.  Radiology/Studies:  DG Chest 2 View Result Date: 11/29/2023 CLINICAL DATA:  Chest pain EXAM: CHEST - 2 VIEW COMPARISON:  03/03/2022 FINDINGS: Cardiac shadow is within normal limits. Mild bibasilar atelectasis is noted. No sizable effusion is seen. No focal confluent infiltrate is noted. IMPRESSION: Mild bibasilar atelectasis. Electronically Signed   By: Oneil Devonshire M.D.   On: 11/29/2023 20:39     Assessment and Plan:  Kaarin Pardy is a 53 y.o. 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 Keturah.  #NSTEMI #Suspected Coronary Vasospasm :: Patient presents with recurrent chest pain and elevated troponins in the setting of previously suspected coronary vasospasm.  Notably, she has not had an NSTEMI in the past 2 years, but she has not been taking her Imdur  for several months due to running out of her prescription.  She currently has low-level chest pain.  My suspicion is that she likely is having coronary spasm vs some other cause of MINOCA once more.  She was loaded with aspirin  and I think that is reasonable to start heparin  while we sort out her clinical course.  I will restart her on her home Imdur  as well as continue her amlodipine .  Please give as needed sublingual nitroglycerin  if chest pain continues.  If she continues to have ongoing chest pain, then may consider coronary CT angiogram to noninvasively evaluate her cors for additional reassurance vs repeat catheterization.  She does have a  contrast allergy so would require steroid prep if necessary. -s/p ASA level - Okay to start heparin  - Complete echo - If chest pain persists despite the reinitiation of Imdur  and SLNGT use, can consider coronary CTA vs repeat LHC w/ precedent steroid prep - If LHC is pursued, would recommend performing a challenge to verify the presence of coronary vasospasm - Keep n.p.o. for now   Risk Assessment/Risk Scores:    TIMI Risk Score for Unstable Angina or Non-ST Elevation MI:   The patient's TIMI risk score is 1, which indicates a 5% risk of all cause mortality, new or recurrent myocardial infarction or need for urgent revascularization in the next 14 days.         For questions or updates, please contact [facility] Please consult [email or web address] for contact info under    Signed, Georganna Archer, MD  11/30/2023 1:54 AM

## 2023-11-30 NOTE — Progress Notes (Signed)
 PHARMACY - ANTICOAGULATION CONSULT NOTE  Pharmacy Consult for Heparin  Indication: chest pain/ACS  Allergies  Allergen Reactions   Ivp Dye [Iodinated Contrast Media] Hives   Lipitor [Atorvastatin] Other (See Comments)    Myalgias   Penicillins Other (See Comments)    Childhood allergy Unknown reaction    Patient Measurements: Height: 5' 7 (170.2 cm) Weight: 96.2 kg (212 lb) IBW/kg (Calculated) : 61.6 HEPARIN  DW (KG): 82.7  Vital Signs: Temp: 97.9 F (36.6 C) (07/17 0725) Temp Source: Oral (07/17 0725) BP: 149/97 (07/17 0800) Pulse Rate: 68 (07/17 0800)  Labs: Recent Labs    11/29/23 1940 11/29/23 2215 11/30/23 0209 11/30/23 0315 11/30/23 0406 11/30/23 0923  HGB 11.8*  --   --  11.5* 12.2  --   HCT 34.0*  --   --  33.4* 34.5*  --   PLT 463*  --   --  411* 472*  --   HEPARINUNFRC  --   --   --   --   --  0.55  CREATININE 1.13*  --   --  0.99  --   --   TROPONINIHS 14 35* 381*  --   --   --     Estimated Creatinine Clearance: 78.2 mL/min (by C-G formula based on SCr of 0.99 mg/dL).   Medical History: Past Medical History:  Diagnosis Date   Bilateral lower extremity edema    Fibromyalgia    GERD (gastroesophageal reflux disease)    History of stroke    Hyperlipidemia    Hypertension    Hypokalemia    MI (myocardial infarction) (HCC)    NSTEMI (non-ST elevated myocardial infarction) (HCC)    Numbness and tingling    Prinzmetal angina (HCC)    Ulcerative colitis (HCC)     Medications:  Home med rec pending  Assessment: 53 y.o. F presents with CP on heparin  for r/o ACS. No AC PTA. CBC ok on admission. -heparin  level 0.55 and at goal on 1350 units/hr  Goal of Therapy:  Heparin  level 0.3-0.7 units/ml Monitor platelets by anticoagulation protocol: Yes   Plan:  Continue heparin  gtt at 1350 units/hr Will f/u heparin  level in 6 hours Daily heparin  level and CBC  Prentice Poisson, PharmD Clinical Pharmacist **Pharmacist phone directory can now be found  on [email or web address] ([email or web address]).  Listed under [facility].

## 2023-11-30 NOTE — H&P (Signed)
 History and Physical    Paige Woodward FMW:[id number] DOB: 1970-10-31 DOA: 11/29/2023  PCP: Rosalea Rosina SAILOR, PA   Patient coming from: Home   Chief Complaint:  Chief Complaint  Patient presents with   Chest Pain    HPI:  Paige Woodward is a 53 y.o. female with hx of recurrent non-STEMI with normal coronaries in 2/'23, 10/'23, question of coronary vasospasm, CVA, CKD3a, hypertension, hyperlipidemia, question of A-fib, chronic pain, GERD, who presents with acute onset of chest pain.  Reports onset of pain around 6 PM in the evening, left-sided, dull.  Was up to 5 out of 10 in severity.  Improved with sublingual nitro with EMS and nitroglycerin  paste in the ED.  Currently 2-3 out of 10.  Continuous since onset.  Associated lightheadedness which is resolved.  No shortness of breath, N/V/diaphoresis.  No other recent illness.  Taking Plavix  prior to admission.   Review of Systems:  ROS complete and negative except as marked above   Allergies  Allergen Reactions   Ivp Dye [Iodinated Contrast Media] Hives   Lipitor [Atorvastatin] Other (See Comments)    Myalgias   Penicillins Other (See Comments)    Childhood allergy Unknown reaction    Prior to Admission medications   Medication Sig Start Date End Date Taking? Authorizing Provider  acetaminophen  (TYLENOL ) 650 MG CR tablet Take 1,300 mg by mouth every 8 (eight) hours as needed for pain.   Yes [provider]  amLODipine  (NORVASC ) 10 MG tablet Take 10 mg by mouth daily. 11/28/23  Yes [provider]  calcium  carbonate (ALKA-SELTZER HEARTBURN) 750 MG chewable tablet Chew 1,500 mg by mouth daily as needed for heartburn.   Yes [provider]  clopidogrel  (PLAVIX ) 75 MG tablet Take 75 mg by mouth at bedtime. 06/14/21  Yes [provider]  hydrOXYzine  (VISTARIL ) 25 MG capsule Take 50 mg by mouth at bedtime. 06/17/21  Yes [provider]  metoprolol  succinate (TOPROL -XL) 50 MG 24 hr tablet Take 50  mg by mouth daily. 11/10/23  Yes [provider]  nitroGLYCERIN  (NITROSTAT ) 0.4 MG SL tablet Place 1 tablet (0.4 mg total) under the tongue every 5 (five) minutes as needed for chest pain. 06/24/21  Yes Nahser, Aleene PARAS, MD  nortriptyline  (PAMELOR ) 25 MG capsule Take 50 mg by mouth at bedtime. 06/16/21  Yes [provider]  oxyCODONE  (ROXICODONE ) 15 MG immediate release tablet Take 15 mg by mouth 3 (three) times daily as needed for pain.   Yes [provider]  pantoprazole  (PROTONIX ) 40 MG tablet TAKE 1 TABLET BY MOUTH DAILY(FURTHER REFILLS BY PCP) 09/20/21  Yes Bhagat, Bhavinkumar, PA  isosorbide  mononitrate (IMDUR ) 30 MG 24 hr tablet TAKE 1/2 TABLET(15 MG) BY MOUTH DAILY 07/13/23   Nahser, Aleene PARAS, MD    Past Medical History:  Diagnosis Date   Bilateral lower extremity edema    Fibromyalgia    GERD (gastroesophageal reflux disease)    History of stroke    Hyperlipidemia    Hypertension    Hypokalemia    MI (myocardial infarction) (HCC)    NSTEMI (non-ST elevated myocardial infarction) (HCC)    Numbness and tingling    Prinzmetal angina (HCC)    Ulcerative colitis (HCC)     Past Surgical History:  Procedure Laterality Date   ABDOMINAL HYSTERECTOMY     APPENDECTOMY     CARDIAC CATHETERIZATION     CHOLECYSTECTOMY     EYE SURGERY     LEFT HEART CATH AND CORONARY ANGIOGRAPHY  N/A 06/22/2021   Procedure: LEFT HEART CATH AND CORONARY ANGIOGRAPHY;  Surgeon: Burnard Debby LABOR, MD;  Location: [facility] INVASIVE CV LAB;  Service: Cardiovascular;  Laterality: N/A;   LEFT HEART CATH AND CORONARY ANGIOGRAPHY N/A 03/03/2022   Procedure: LEFT HEART CATH AND CORONARY ANGIOGRAPHY;  Surgeon: Court Dorn PARAS, MD;  Location: [facility] INVASIVE CV LAB;  Service: Cardiovascular;  Laterality: N/A;   SPLENECTOMY       reports that she has been smoking cigarettes. She has never used smokeless tobacco. She reports that she does not drink alcohol and does not use drugs.  Family History  Problem  Relation Age of Onset   Stroke Father      Physical Exam: Vitals:   11/29/23 1928 11/29/23 2030 11/29/23 2330 11/29/23 2345  BP:  129/88  137/69  Pulse:  72  77  Resp:  16  10  Temp:   97.7 F (36.5 C)   TempSrc:   Oral   SpO2:  100%  99%  Weight: 96.2 kg     Height: 5' 7 (1.702 m)       Gen: Awake, alert, NAD   CV: Regular, normal S1, S2, no murmurs  Resp: Normal WOB, CTAB  Abd: Flat, normoactive, nontender MSK: Symmetric, slight asymmetric edema in the RLE 1+ greater than LLE trace Skin: No rashes or lesions to exposed skin  Neuro: Alert and interactive  Psych: euthymic, appropriate    Data review:   Labs reviewed, notable for:   High-sensitivity Trop 14 -> 35 WBC 11   Micro:  Results for orders placed or performed during the hospital encounter of 06/21/21  Resp Panel by RT-PCR (Flu A&B, Covid) Nasopharyngeal Swab     Status: None   Collection Time: 06/21/21  3:41 AM   Specimen: Nasopharyngeal Swab; Nasopharyngeal(NP) swabs in vial transport medium  Result Value Ref Range Status   SARS Coronavirus 2 by RT PCR NEGATIVE NEGATIVE Final    Comment: (NOTE) SARS-CoV-2 target nucleic acids are NOT DETECTED.  The SARS-CoV-2 RNA is generally detectable in upper respiratory specimens during the acute phase of infection. The lowest concentration of SARS-CoV-2 viral copies this assay can detect is 138 copies/mL. A negative result does not preclude SARS-Cov-2 infection and should not be used as the sole basis for treatment or other patient management decisions. A negative result may occur with  improper specimen collection/handling, submission of specimen other than nasopharyngeal swab, presence of viral mutation(s) within the areas targeted by this assay, and inadequate number of viral copies(<138 copies/mL). A negative result must be combined with clinical observations, patient history, and epidemiological information. The expected result is Negative.  Fact Sheet  for Patients:  BloggerCourse.com  Fact Sheet for Healthcare Providers:  SeriousBroker.it  This test is no t yet approved or cleared by the [place]  FDA and  has been authorized for detection and/or diagnosis of SARS-CoV-2 by FDA under an Emergency Use Authorization (EUA). This EUA will remain  in effect (meaning this test can be used) for the duration of the COVID-19 declaration under Section 564(b)(1) of the Act, 21 U.S.C.section 360bbb-3(b)(1), unless the authorization is terminated  or revoked sooner.       Influenza A by PCR NEGATIVE NEGATIVE Final   Influenza B by PCR NEGATIVE NEGATIVE Final    Comment: (NOTE) The Xpert Xpress SARS-CoV-2/FLU/RSV plus assay is intended as an aid in the diagnosis of influenza from Nasopharyngeal swab specimens and should not be used as a sole basis for treatment. Nasal  washings and aspirates are unacceptable for Xpert Xpress SARS-CoV-2/FLU/RSV testing.  Fact Sheet for Patients: BloggerCourse.com  Fact Sheet for Healthcare Providers: SeriousBroker.it  This test is not yet approved or cleared by the [place]  FDA and has been authorized for detection and/or diagnosis of SARS-CoV-2 by FDA under an Emergency Use Authorization (EUA). This EUA will remain in effect (meaning this test can be used) for the duration of the COVID-19 declaration under Section 564(b)(1) of the Act, 21 U.S.C. section 360bbb-3(b)(1), unless the authorization is terminated or revoked.  Performed at [facility], [address] [place], [place] [address]   MRSA Next Gen by PCR, Nasal     Status: None   Collection Time: 06/21/21  6:46 AM   Specimen: Nasal Mucosa; Nasal Swab  Result Value Ref Range Status   MRSA by PCR Next Gen NOT DETECTED NOT DETECTED Final    Comment: (NOTE) The GeneXpert MRSA Assay (FDA approved for NASAL specimens only), is one  component of a comprehensive MRSA colonization surveillance program. It is not intended to diagnose MRSA infection nor to guide or monitor treatment for MRSA infections. Test performance is not FDA approved in patients less than 64 years old. Performed at [facility], [address], [place], [place] [address]     Imaging reviewed:  DG Chest 2 View Result Date: 11/29/2023 CLINICAL DATA:  Chest pain EXAM: CHEST - 2 VIEW COMPARISON:  03/03/2022 FINDINGS: Cardiac shadow is within normal limits. Mild bibasilar atelectasis is noted. No sizable effusion is seen. No focal confluent infiltrate is noted. IMPRESSION: Mild bibasilar atelectasis. Electronically Signed   By: Oneil Devonshire M.D.   On: 11/29/2023 20:39    EKG:  Personally reviewed, sinus rhythm, LVH, STE anteriorly not meeting STEMI criteria, question repolarization change.  EMS/ED Course:  Refused aspirin  with EMS, treated with sublingual nitroglycerin .  No additional meds in the ED.   Assessment/Plan:  53 y.o. female with hx recurrent non-STEMI with normal coronaries in 2/'23, 10/'23, question of coronary vasospasm, CVA, CKD3a, hypertension, hyperlipidemia, question of A-fib, chronic pain, GERD, who presents with acute onset of chest pain, found to have acute myocardial injury  Acute myocardial injury Hx MINOCA, recurrent NSTEMI in 2/'23, 10/'23  P/w acute chest pain since 6PM. EKG suspect repolarization change, no acute ischemic changes. HS trop 14 -> 35.  Suspect etiology potentially vasospasm in light of her prior history. -Cardiology consult, briefly discussed with Dr. Floretta  -Loaded with aspirin  324 mg x 1, continue home Plavix  75 mg daily for now. -Hold off on heparin , if worsening troponin trend would consider  -Nitroglycerin  0.5 in paste q6 hr scheduled  -Continue home amlodipine  10 mg, ISMN 15 mg.  Consider uptitration or nitrate for potential vasospastic pain. -Check lipids and A1c., urine drug screen  -Check  D-dimer with slight asymmetric edema -Ordered for echo  Chronic medical problems: History of CVA: Antiplatelet per above.  History of statin intolerance CKD 3 A: Baseline creatinine near 1.1 Hypertension: Continue home amlodipine , metoprolol  Hyperlipidemia: Statin intolerant question of A-fib: Noted per cards review. Chronic pain: Continue home oxycodone  prn GERD: Continue home PPI    Body mass index is 33.2 kg/m.  Obesity class I would benefit from weight loss outpatient  DVT prophylaxis:  Lovenox  Code Status:  Full Code Diet:  Diet Orders (From admission, onward)     Start     Ordered   11/30/23 0108  Diet Heart Room service appropriate? Yes; Fluid consistency: Thin  Diet effective now       Question Answer Comment  Room service appropriate? Yes   Fluid consistency: Thin      11/30/23 0109           Family Communication: None Consults: Cardiology Admission status:   Observation, Telemetry bed  Severity of Illness: The appropriate patient status for this patient is OBSERVATION. Observation status is judged to be reasonable and necessary in order to provide the required intensity of service to ensure the patient's safety. The patient's presenting symptoms, physical exam findings, and initial radiographic and laboratory data in the context of their medical condition is felt to place them at decreased risk for further clinical deterioration. Furthermore, it is anticipated that the patient will be medically stable for discharge from the hospital within 2 midnights of admission.    Dorn Dawson, MD [facility]  How to contact the [facility] Attending or Consulting provider 7A - 7P or covering provider during after hours 7P -7A, for this patient.  Check the care team in [facility] and look for a) attending/consulting [facility] provider listed and b) the [facility] team listed Log into [email or web address] and use 's universal password to access. If you do not have the password, please  contact the hospital operator. Locate the [facility] provider you are looking for under [facility] and page to a number that you can be directly reached. If you still have difficulty reaching the provider, please page the [facility] (Director on Call) for the Hospitalists listed on amion for assistance.  11/30/2023, 1:22 AM

## 2023-11-30 NOTE — Progress Notes (Signed)
 Patient seen and examined.  Admitted early morning hours by nighttime hospitalist.  See H&P assessment plan.  On my interview, patient denied any complaints.  He has been up about walking to the bathroom.  Remains on heparin  infusion.  Her daughter who is also patient next-door was visiting her.  In brief, 53 year old with history of coronary artery disease, previous normal cardiac cath and thought to be coronary spasms currently not taking nitrates admitted with episode of chest pain and found to have elevated troponins.  Subsequent troponins elevated from [phone].  Currently chest pain-free.  Remains on aspirin , heparin  infusion, Plavix , metoprolol .  Followed by cardiology.  Not anticipating cardiac cath today.  Will allow to eat.  Admitted to cardiac telemetry due to non-STEMI, heparin  infusion.   Total time spent: 30 minutes.  Same-day admit.  No charge visit.

## 2023-11-30 NOTE — Progress Notes (Signed)
*  PRELIMINARY RESULTS* Echocardiogram 2D Echocardiogram has been performed.  Paige Woodward Stallion 11/30/2023, 11:01 AM

## 2023-11-30 NOTE — Progress Notes (Signed)
 PHARMACY - ANTICOAGULATION CONSULT NOTE  Pharmacy Consult for Heparin  Indication: chest pain/ACS  Allergies  Allergen Reactions   Ivp Dye [Iodinated Contrast Media] Hives   Lipitor [Atorvastatin] Other (See Comments)    Myalgias   Penicillins Other (See Comments)    Childhood allergy Unknown reaction    Patient Measurements: Height: 5' 7 (170.2 cm) Weight: 96.2 kg (212 lb) IBW/kg (Calculated) : 61.6 HEPARIN  DW (KG): 82.7  Vital Signs: Temp: 98 F (36.7 C) (07/17 0327) Temp Source: Oral (07/17 0327) BP: 147/83 (07/17 0145) Pulse Rate: 91 (07/17 0145)  Labs: Recent Labs    11/29/23 1940 11/29/23 2215 11/30/23 0209 11/30/23 0315  HGB 11.8*  --   --  11.5*  HCT 34.0*  --   --  33.4*  PLT 463*  --   --  411*  CREATININE 1.13*  --   --  0.99  TROPONINIHS 14 35* 381*  --     Estimated Creatinine Clearance: 78.2 mL/min (by C-G formula based on SCr of 0.99 mg/dL).   Medical History: Past Medical History:  Diagnosis Date   Bilateral lower extremity edema    Fibromyalgia    GERD (gastroesophageal reflux disease)    History of stroke    Hyperlipidemia    Hypertension    Hypokalemia    MI (myocardial infarction) (HCC)    NSTEMI (non-ST elevated myocardial infarction) (HCC)    Numbness and tingling    Prinzmetal angina (HCC)    Ulcerative colitis (HCC)     Medications:  Home med rec pending  Assessment: 53 y.o. F presents with CP. To begin heparin  for r/o ACS. No AC PTA. CBC ok on admission.  Goal of Therapy:  Heparin  level 0.3-0.7 units/ml Monitor platelets by anticoagulation protocol: Yes   Plan:  D/c Lovenox  (none given) Heparin  IV bolus 4000 units Heparin  gtt at 1350 units/hr Will f/u heparin  level in 6 hours Daily heparin  level and CBC  Vito Ralph, PharmD, BCPS Please see amion for complete clinical pharmacist phone list 11/30/2023,3:50 AM

## 2023-11-30 NOTE — ED Notes (Signed)
 Patient alerted staff that IV did not feel right, upon assessment  catheter was dislodged slightly. But blood still in tubing. Attempted to flush, but patient moved arm completely dislodging catheter. IV restarted, as well as heparin 

## 2023-11-30 NOTE — Progress Notes (Signed)
 PHARMACY - ANTICOAGULATION CONSULT NOTE  Pharmacy Consult for Heparin  Indication: chest pain/ACS  Allergies  Allergen Reactions   Ivp Dye [Iodinated Contrast Media] Hives   Lipitor [Atorvastatin] Other (See Comments)    Myalgias   Penicillins Other (See Comments)    Childhood allergy Unknown reaction    Patient Measurements: Height: 5' 7 (170.2 cm) Weight: 96.2 kg (212 lb) IBW/kg (Calculated) : 61.6 HEPARIN  DW (KG): 82.7  Vital Signs: Temp: 98.8 F (37.1 C) (07/17 1600) Temp Source: Oral (07/17 1600) BP: 159/100 (07/17 1600) Pulse Rate: 65 (07/17 1600)  Labs: Recent Labs    11/29/23 1940 11/29/23 2215 11/30/23 0209 11/30/23 0315 11/30/23 0406 11/30/23 0923 11/30/23 1554  HGB 11.8*  --   --  11.5* 12.2  --   --   HCT 34.0*  --   --  33.4* 34.5*  --   --   PLT 463*  --   --  411* 472*  --   --   HEPARINUNFRC  --   --   --   --   --  0.55 0.58  CREATININE 1.13*  --   --  0.99  --   --   --   TROPONINIHS 14 35* 381*  --   --  3,690*  --     Estimated Creatinine Clearance: 78.2 mL/min (by C-G formula based on SCr of 0.99 mg/dL).   Medical History: Past Medical History:  Diagnosis Date   Bilateral lower extremity edema    Fibromyalgia    GERD (gastroesophageal reflux disease)    History of stroke    Hyperlipidemia    Hypertension    Hypokalemia    MI (myocardial infarction) (HCC)    NSTEMI (non-ST elevated myocardial infarction) (HCC)    Numbness and tingling    Prinzmetal angina (HCC)    Ulcerative colitis (HCC)     Medications:  Home med rec pending  Assessment: 53 y.o. F presents with CP on heparin  for r/o ACS. No AC PTA. CBC ok on admission. -heparin  level 0.55 and at goal on 1350 units/hr  PM follow-up: remains therapeutic at 0.58, stable from previous level  Goal of Therapy:  Heparin  level 0.3-0.7 units/ml Monitor platelets by anticoagulation protocol: Yes   Plan:  Continue heparin  gtt at 1350 units/hr Daily heparin  level and  CBC  Randall Call, PharmD, BCPS 11/30/23 4:43 PM

## 2023-12-01 ENCOUNTER — Other Ambulatory Visit: Payer: Self-pay | Admitting: Physician Assistant

## 2023-12-01 ENCOUNTER — Telehealth: Payer: Self-pay | Admitting: Cardiology

## 2023-12-01 DIAGNOSIS — I201 Angina pectoris with documented spasm: Secondary | ICD-10-CM | POA: Diagnosis not present

## 2023-12-01 DIAGNOSIS — Z1231 Encounter for screening mammogram for malignant neoplasm of breast: Secondary | ICD-10-CM

## 2023-12-01 DIAGNOSIS — R7989 Other specified abnormal findings of blood chemistry: Secondary | ICD-10-CM | POA: Diagnosis not present

## 2023-12-01 DIAGNOSIS — I10 Essential (primary) hypertension: Secondary | ICD-10-CM | POA: Diagnosis not present

## 2023-12-01 LAB — CBC
HCT: 34.8 % — ABNORMAL LOW (ref 36.0–46.0)
Hemoglobin: 12.5 g/dL (ref 12.0–15.0)
MCH: 30.3 pg (ref 26.0–34.0)
MCHC: 35.9 g/dL (ref 30.0–36.0)
MCV: 84.3 fL (ref 80.0–100.0)
Platelets: 477 K/uL — ABNORMAL HIGH (ref 150–400)
RBC: 4.13 MIL/uL (ref 3.87–5.11)
RDW: 13.5 % (ref 11.5–15.5)
WBC: 15 K/uL — ABNORMAL HIGH (ref 4.0–10.5)
nRBC: 0 % (ref 0.0–0.2)

## 2023-12-01 LAB — HEPARIN LEVEL (UNFRACTIONATED): Heparin Unfractionated: 0.69 [IU]/mL (ref 0.30–0.70)

## 2023-12-01 LAB — TROPONIN I (HIGH SENSITIVITY): Troponin I (High Sensitivity): 3460 ng/L (ref <18)

## 2023-12-01 MED ORDER — ISOSORBIDE MONONITRATE ER 30 MG PO TB24: 30.0000 mg | ORAL_TABLET | Freq: Every day | ORAL | 0 refills | Status: DC

## 2023-12-01 MED ORDER — METOPROLOL SUCCINATE ER 50 MG PO TB24: 50.0000 mg | ORAL_TABLET | Freq: Every day | ORAL | 0 refills | Status: DC

## 2023-12-01 MED ORDER — NITROGLYCERIN 0.4 MG SL SUBL
0.4000 mg | SUBLINGUAL_TABLET | SUBLINGUAL | 11 refills | Status: AC | PRN
Start: 2023-12-01 — End: ?

## 2023-12-01 MED ORDER — HYDROXYZINE HCL 25 MG PO TABS: 50.0000 mg | ORAL_TABLET | Freq: Every day | ORAL | Status: DC

## 2023-12-01 MED ORDER — BUTALBITAL-APAP-CAFFEINE 50-325-40 MG PO TABS
1.0000 | ORAL_TABLET | Freq: Four times a day (QID) | ORAL | Status: DC | PRN
  Administered 2023-12-01: 1 via ORAL
  Filled 2023-12-01: qty 1

## 2023-12-01 NOTE — Progress Notes (Signed)
 PHARMACY - ANTICOAGULATION CONSULT NOTE  Pharmacy Consult for Heparin  Indication: chest pain/ACS  Allergies  Allergen Reactions   Ivp Dye [Iodinated Contrast Media] Hives   Lipitor [Atorvastatin] Other (See Comments)    Myalgias   Penicillins Other (See Comments)    Childhood allergy Unknown reaction    Patient Measurements: Height: 5' 7 (170.2 cm) Weight: 91.2 kg (201 lb) IBW/kg (Calculated) : 61.6 HEPARIN  DW (KG): 82.7  Vital Signs: Temp: 98.5 F (36.9 C) (07/18 0551) Temp Source: Oral (07/18 0551) BP: 132/81 (07/18 0551) Pulse Rate: 61 (07/18 0551)  Labs: Recent Labs    11/29/23 1940 11/29/23 2215 11/30/23 0209 11/30/23 0315 11/30/23 0406 11/30/23 0923 11/30/23 1554 12/01/23 0206  HGB 11.8*  --   --  11.5* 12.2  --   --  12.5  HCT 34.0*  --   --  33.4* 34.5*  --   --  34.8*  PLT 463*  --   --  411* 472*  --   --  477*  HEPARINUNFRC  --   --   --   --   --  0.55 0.58 0.69  CREATININE 1.13*  --   --  0.99  --   --   --   --   TROPONINIHS 14   < > 381*  --   --  3,690*  --  3,460*   < > = values in this interval not displayed.    Estimated Creatinine Clearance: 76.1 mL/min (by C-G formula based on SCr of 0.99 mg/dL).   Medical History: Past Medical History:  Diagnosis Date   Bilateral lower extremity edema    Fibromyalgia    GERD (gastroesophageal reflux disease)    History of stroke    Hyperlipidemia    Hypertension    Hypokalemia    MI (myocardial infarction) (HCC)    NSTEMI (non-ST elevated myocardial infarction) (HCC)    Numbness and tingling    Prinzmetal angina (HCC)    Ulcerative colitis (HCC)     Assessment: 53 y.o. F presents with CP on heparin  for r/o ACS. Patient has history of vasospastic CAD with clean cardiac cath in Feb and Oct 2023. No anticoagulation prior to admission. Pharmacy consulted for heparin .    Heparin  level 0.69 is at high end of therapeutic on 1350 units/hr. CBC stable   Goal of Therapy:  Heparin  level 0.3-0.7  units/ml Monitor platelets by anticoagulation protocol: Yes   Plan:  Decrease heparin  to 1300 units/hr Monitor daily heparin  level, CBC, signs/symptoms of bleeding    Jinnie Door, PharmD, BCPS, [facility] Clinical Pharmacist  Please check AMION for all [facility] Pharmacy phone numbers After 10:00 PM, call Main Pharmacy [phone]

## 2023-12-01 NOTE — Discharge Summary (Signed)
 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  Subsequent troponins elevated from [phone].  She was admitted to the hospital with Plavix , heparin  infusion.  Since admission she remained chest pain-free.  Echocardiogram without any wall motion abnormality.  Seen and followed by cardiology.  With clinical improvement after restarting nitrates, patient is going home with outpatient cardiology follow-up on following regimen.  Imdur  30 mg daily Metoprolol  XL 50 mg daily Plavix  75 mg daily Nitroglycerin  sublingual as needed  Discharge Diagnoses:  Principal Problem:   Chest pain Active Problems:   Primary hypertension   Vasospastic angina [facility])    Discharge Instructions  Discharge Instructions     Diet - low sodium heart healthy   Complete by: As directed    Increase activity slowly   Complete by: As directed       Allergies as of 12/01/2023       Reactions   Ivp Dye [iodinated Contrast Media] Hives   Lipitor [atorvastatin] Other (See Comments)   Myalgias   Penicillins Other (See Comments)   Childhood allergy Unknown reaction        Medication List     TAKE these medications    acetaminophen  650 MG CR tablet Commonly known as: TYLENOL  Take 1,300 mg by mouth every 8 (eight) hours as needed for pain.   Alka-Seltzer Heartburn 750 MG chewable tablet Generic drug: calcium   carbonate Chew 1,500 mg by mouth daily as needed for heartburn.   amLODipine  10 MG tablet Commonly known as: NORVASC  Take 10 mg by mouth daily.   clopidogrel  75 MG tablet Commonly known as: PLAVIX  Take 75 mg by mouth at bedtime.   hydrOXYzine  25 MG capsule Commonly known as: VISTARIL  Take 50 mg by mouth at bedtime.   isosorbide  mononitrate 30 MG 24 hr tablet Commonly known as: IMDUR  Take 1 tablet (30 mg total) by mouth daily. What changed: See the new instructions.   metoprolol  succinate 50 MG 24 hr tablet Commonly known as: TOPROL -XL Take 1 tablet (50 mg total) by mouth daily.   nitroGLYCERIN  0.4 MG SL tablet Commonly known as: NITROSTAT  Place 1 tablet (0.4 mg total) under the tongue every 5 (five) minutes as needed for chest pain.   nortriptyline  25 MG capsule Commonly known as: PAMELOR  Take 50 mg by mouth at bedtime.   oxyCODONE  15 MG immediate release tablet Commonly known as: ROXICODONE  Take 15 mg by mouth 3 (three) times daily as needed for pain.   pantoprazole  40 MG tablet Commonly known as: PROTONIX  TAKE 1 TABLET BY MOUTH DAILY(FURTHER REFILLS BY PCP)        Allergies  Allergen Reactions   Ivp Dye [Iodinated Contrast Media] Hives   Lipitor [Atorvastatin] Other (See Comments)    Myalgias   Penicillins Other (See Comments)    Childhood allergy Unknown reaction    Consultations: Cardiology   Procedures/Studies: ECHOCARDIOGRAM COMPLETE Result Date: 11/30/2023    ECHOCARDIOGRAM REPORT   Patient Name:   Paige Woodward Date of Exam: 11/30/2023  Medical Rec #:  [id number]     Height:       67.0 in Accession #:    [id number]    Weight:       212.0 lb Date of Birth:  May 18, 1970     BSA:          2.073 m Patient Age:    53 years      BP:           143/93 mmHg Patient Gender: F             HR:           67 bpm. Exam Location:  Inpatient Procedure: 2D Echo, Color Doppler and Cardiac Doppler (Both Spectral and Color            Flow Doppler were utilized during  procedure). Indications:    Elevated troponin  History:        Patient has prior history of Echocardiogram examinations, most                 recent 03/06/2022.  Sonographer:    Benard Stallion Referring Phys: [id number] JONATHAN SEGARS IMPRESSIONS  1. Left ventricular ejection fraction, by estimation, is 60 to 65%. The left ventricle has normal function. The left ventricle has no regional wall motion abnormalities. Left ventricular diastolic parameters were normal.  2. Right ventricular systolic function is normal. The right ventricular size is normal. There is normal pulmonary artery systolic pressure.  3. The mitral valve is normal in structure. Mild mitral valve regurgitation. No evidence of mitral stenosis.  4. The aortic valve is normal in structure. Aortic valve regurgitation is not visualized. No aortic stenosis is present.  5. The inferior vena cava is normal in size with greater than 50% respiratory variability, suggesting right atrial pressure of 3 mmHg. FINDINGS  Left Ventricle: Left ventricular ejection fraction, by estimation, is 60 to 65%. The left ventricle has normal function. The left ventricle has no regional wall motion abnormalities. The left ventricular internal cavity size was normal in size. There is  no left ventricular hypertrophy. Left ventricular diastolic parameters were normal. Right Ventricle: The right ventricular size is normal. No increase in right ventricular wall thickness. Right ventricular systolic function is normal. There is normal pulmonary artery systolic pressure. The tricuspid regurgitant velocity is 0.98 m/s, and  with an assumed right atrial pressure of 3 mmHg, the estimated right ventricular systolic pressure is 6.8 mmHg. Left Atrium: Left atrial size was normal in size. Right Atrium: Right atrial size was normal in size. Pericardium: There is no evidence of pericardial effusion. Mitral Valve: The mitral valve is normal in structure. Mild mitral valve regurgitation. No  evidence of mitral valve stenosis. Tricuspid Valve: The tricuspid valve is normal in structure. Tricuspid valve regurgitation is not demonstrated. No evidence of tricuspid stenosis. Aortic Valve: The aortic valve is normal in structure. Aortic valve regurgitation is not visualized. No aortic stenosis is present. Aortic valve mean gradient measures 4.0 mmHg. Aortic valve peak gradient measures 7.1 mmHg. Pulmonic Valve: The pulmonic valve was normal in structure. Pulmonic valve regurgitation is not visualized. No evidence of pulmonic stenosis. Aorta: The aortic root is normal in size and structure. Venous: The inferior vena cava is normal in size with greater than 50% respiratory variability, suggesting right atrial pressure of 3 mmHg. IAS/Shunts: No atrial level shunt detected by color flow Doppler.  LEFT VENTRICLE PLAX 2D LVIDd:         4.65 cm Diastology LVIDs:  3.40 cm LV e' medial:    11.50 cm/s LV PW:         0.90 cm LV E/e' medial:  7.2 LV IVS:        0.90 cm LV e' lateral:   9.01 cm/s                        LV E/e' lateral: 9.2  RIGHT VENTRICLE RV Basal diam:  3.30 cm RV Mid diam:    3.10 cm RV S prime:     12.30 cm/s TAPSE (M-mode): 2.8 cm LEFT ATRIUM             Index        RIGHT ATRIUM           Index LA diam:        3.50 cm 1.69 cm/m   RA Area:     14.00 cm LA Vol (A2C):   58.2 ml 28.08 ml/m  RA Volume:   32.60 ml  15.73 ml/m LA Vol (A4C):   43.7 ml 21.08 ml/m LA Biplane Vol: 52.0 ml 25.08 ml/m  AORTIC VALVE AV Vmax:           133.00 cm/s AV Vmean:          89.500 cm/s AV VTI:            0.295 m AV Peak Grad:      7.1 mmHg AV Mean Grad:      4.0 mmHg LVOT Vmax:         110.00 cm/s LVOT Vmean:        69.600 cm/s LVOT VTI:          0.225 m LVOT/AV VTI ratio: 0.76 MITRAL VALVE                TRICUSPID VALVE MV Area (PHT): 3.53 cm     TR Peak grad:   3.8 mmHg MV Decel Time: 215 msec     TR Vmax:        97.70 cm/s MR Peak grad: 30.9 mmHg MR Vmax:      278.00 cm/s   SHUNTS MV E velocity: 83.20  cm/s   Systemic VTI: 0.22 m MV A velocity: 126.00 cm/s MV E/A ratio:  0.66 Morene Brownie Electronically signed by Morene Brownie Signature Date/Time: 11/30/2023/4:17:46 PM    Final    DG Chest 2 View Result Date: 11/29/2023 CLINICAL DATA:  Chest pain EXAM: CHEST - 2 VIEW COMPARISON:  03/03/2022 FINDINGS: Cardiac shadow is within normal limits. Mild bibasilar atelectasis is noted. No sizable effusion is seen. No focal confluent infiltrate is noted. IMPRESSION: Mild bibasilar atelectasis. Electronically Signed   By: Oneil Devonshire M.D.   On: 11/29/2023 20:39   (Echo, Carotid, EGD, Colonoscopy, ERCP)    Subjective: Patient seen and examined.  Denied any complaints.  She is more worried about her daughter's health and herself.  Has remained chest pain-free since admission.  Eager to go home.   Discharge Exam: Vitals:   12/01/23 0754 12/01/23 1031  BP: (!) 144/85 (!) 142/95  Pulse: 79 74  Resp: 18 18  Temp: 98.6 F (37 C) 98.7 F (37.1 C)  SpO2: 100% 95%   Vitals:   12/01/23 0001 12/01/23 0551 12/01/23 0754 12/01/23 1031  BP: (!) 140/86 132/81 (!) 144/85 (!) 142/95  Pulse: 67 61 79 74  Resp: 18 17 18 18   Temp: 98.5 F (36.9 C) 98.5 F (36.9 C) 98.6 F (  37 C) 98.7 F (37.1 C)  TempSrc: Oral Oral Oral Oral  SpO2: 100% 100% 100% 95%  Weight:  91.2 kg    Height:        General: Pt is alert, awake, not in acute distress Cardiovascular: RRR, S1/S2 +, no rubs, no gallops Respiratory: CTA bilaterally, no wheezing, no rhonchi Abdominal: Soft, NT, ND, bowel sounds + Extremities: no edema, no cyanosis    The results of significant diagnostics from this hospitalization (including imaging, microbiology, ancillary and laboratory) are listed below for reference.     Microbiology: No results found for this or any previous visit (from the past 240 hours).   Labs: BNP (last 3 results) No results for input(s): BNP in the last 8760 hours. Basic Metabolic Panel: Recent Labs  Lab  11/29/23 1940 11/30/23 0315  NA 135 135  K 3.9 3.6  CL 104 105  CO2 24 22  GLUCOSE 105* 108*  BUN 14 11  CREATININE 1.13* 0.99  CALCIUM  9.1 9.3  MG  --  1.9  PHOS  --  3.5   Liver Function Tests: No results for input(s): AST, ALT, ALKPHOS, BILITOT, PROT, ALBUMIN in the last 168 hours. No results for input(s): LIPASE, AMYLASE in the last 168 hours. No results for input(s): AMMONIA in the last 168 hours. CBC: Recent Labs  Lab 11/29/23 1940 11/30/23 0315 11/30/23 0406 12/01/23 0206  WBC 11.0* 12.3* 10.7* 15.0*  HGB 11.8* 11.5* 12.2 12.5  HCT 34.0* 33.4* 34.5* 34.8*  MCV 87.4 87.4 85.2 84.3  PLT 463* 411* 472* 477*   Cardiac Enzymes: No results for input(s): CKTOTAL, CKMB, CKMBINDEX, TROPONINI in the last 168 hours. BNP: Invalid input(s): POCBNP CBG: No results for input(s): GLUCAP in the last 168 hours. D-Dimer Recent Labs    11/30/23 0209  DDIMER <0.27   Hgb A1c Recent Labs    11/30/23 0315  HGBA1C 4.7*   Lipid Profile Recent Labs    11/30/23 0315  CHOL 147  HDL 34*  LDLCALC 95  TRIG 89  CHOLHDL 4.3   Thyroid function studies No results for input(s): TSH, T4TOTAL, T3FREE, THYROIDAB in the last 72 hours.  Invalid input(s): FREET3 Anemia work up No results for input(s): VITAMINB12, FOLATE, FERRITIN, TIBC, IRON, RETICCTPCT in the last 72 hours. Urinalysis    Component Value Date/Time   COLORURINE YELLOW 10/25/2018 0501   APPEARANCEUR CLOUDY (A) 10/25/2018 0501   LABSPEC >1.030 (H) 10/25/2018 0501   PHURINE 6.0 10/25/2018 0501   GLUCOSEU NEGATIVE 10/25/2018 0501   HGBUR LARGE (A) 10/25/2018 0501   BILIRUBINUR SMALL (A) 10/25/2018 0501   KETONESUR 15 (A) 10/25/2018 0501   PROTEINUR 30 (A) 10/25/2018 0501   NITRITE NEGATIVE 10/25/2018 0501   LEUKOCYTESUR NEGATIVE 10/25/2018 0501   Sepsis Labs Recent Labs  Lab 11/29/23 1940 11/30/23 0315 11/30/23 0406 12/01/23 0206  WBC 11.0* 12.3* 10.7*  15.0*   Microbiology No results found for this or any previous visit (from the past 240 hours).   Time coordinating discharge: 29 minutes  SIGNED:   Renato Applebaum, MD  [facility] 12/01/2023, 10:36 AM

## 2023-12-01 NOTE — Plan of Care (Signed)
 Problem: Education: Goal: Knowledge of General Education information will improve Description: Including pain rating scale, medication(s)/side effects and non-pharmacologic comfort measures 12/01/2023 1244 by Valora Devere Goodie, RN Outcome: Adequate for Discharge 12/01/2023 1244 by Valora Devere Goodie, RN Outcome: Adequate for Discharge 12/01/2023 1241 by Valora Devere Goodie, RN Outcome: Adequate for Discharge   Problem: Health Behavior/Discharge Planning: Goal: Ability to manage health-related needs will improve 12/01/2023 1244 by Valora Devere Goodie, RN Outcome: Adequate for Discharge 12/01/2023 1244 by Valora Devere Goodie, RN Outcome: Adequate for Discharge 12/01/2023 1241 by Valora Devere Goodie, RN Outcome: Adequate for Discharge   Problem: Clinical Measurements: Goal: Ability to maintain clinical measurements within normal limits will improve 12/01/2023 1244 by Valora Devere Goodie, RN Outcome: Adequate for Discharge 12/01/2023 1244 by Valora Devere Goodie, RN Outcome: Adequate for Discharge 12/01/2023 1241 by Valora Devere Goodie, RN Outcome: Adequate for Discharge Goal: Will remain free from infection 12/01/2023 1244 by Valora Devere Goodie, RN Outcome: Adequate for Discharge 12/01/2023 1244 by Valora Devere Goodie, RN Outcome: Adequate for Discharge 12/01/2023 1241 by Valora Devere Goodie, RN Outcome: Adequate for Discharge Goal: Diagnostic test results will improve 12/01/2023 1244 by Valora Devere Goodie, RN Outcome: Adequate for Discharge 12/01/2023 1244 by Valora Devere Goodie, RN Outcome: Adequate for Discharge 12/01/2023 1241 by Valora Devere Goodie, RN Outcome: Adequate for Discharge Goal: Respiratory complications will improve 12/01/2023 1244 by Valora Devere Goodie, RN Outcome: Adequate for Discharge 12/01/2023 1244 by Valora Devere Goodie, RN Outcome: Adequate for Discharge 12/01/2023 1241 by Valora Devere Goodie, RN Outcome:  Adequate for Discharge Goal: Cardiovascular complication will be avoided 12/01/2023 1244 by Valora Devere Goodie, RN Outcome: Adequate for Discharge 12/01/2023 1244 by Valora Devere Goodie, RN Outcome: Adequate for Discharge 12/01/2023 1241 by Valora Devere Goodie, RN Outcome: Adequate for Discharge   Problem: Activity: Goal: Risk for activity intolerance will decrease 12/01/2023 1244 by Valora Devere Goodie, RN Outcome: Adequate for Discharge 12/01/2023 1244 by Valora Devere Goodie, RN Outcome: Adequate for Discharge 12/01/2023 1241 by Valora Devere Goodie, RN Outcome: Adequate for Discharge   Problem: Nutrition: Goal: Adequate nutrition will be maintained 12/01/2023 1244 by Valora Devere Goodie, RN Outcome: Adequate for Discharge 12/01/2023 1244 by Valora Devere Goodie, RN Outcome: Adequate for Discharge 12/01/2023 1241 by Valora Devere Goodie, RN Outcome: Adequate for Discharge   Problem: Coping: Goal: Level of anxiety will decrease 12/01/2023 1244 by Valora Devere Goodie, RN Outcome: Adequate for Discharge 12/01/2023 1244 by Valora Devere Goodie, RN Outcome: Adequate for Discharge 12/01/2023 1241 by Valora Devere Goodie, RN Outcome: Adequate for Discharge   Problem: Elimination: Goal: Will not experience complications related to bowel motility 12/01/2023 1244 by Valora Devere Goodie, RN Outcome: Adequate for Discharge 12/01/2023 1244 by Valora Devere Goodie, RN Outcome: Adequate for Discharge 12/01/2023 1241 by Valora Devere Goodie, RN Outcome: Adequate for Discharge Goal: Will not experience complications related to urinary retention 12/01/2023 1244 by Valora Devere Goodie, RN Outcome: Adequate for Discharge 12/01/2023 1244 by Valora Devere Goodie, RN Outcome: Adequate for Discharge 12/01/2023 1241 by Valora Devere Goodie, RN Outcome: Adequate for Discharge   Problem: Pain Managment: Goal: General experience of comfort will improve and/or be  controlled 12/01/2023 1244 by Valora Devere Goodie, RN Outcome: Adequate for Discharge 12/01/2023 1244 by Valora Devere Goodie, RN Outcome: Adequate for Discharge 12/01/2023 1241 by Valora Devere Goodie, RN Outcome: Adequate for Discharge   Problem: Safety: Goal: Ability to remain free from injury will improve 12/01/2023 1244 by Valora Devere Goodie, RN Outcome: Adequate for Discharge 12/01/2023 1244 by  Valora Devere Goodie, RN Outcome: Adequate for Discharge 12/01/2023 1241 by Valora Devere Goodie, RN Outcome: Adequate for Discharge   Problem: Skin Integrity: Goal: Risk for impaired skin integrity will decrease 12/01/2023 1244 by Valora Devere Goodie, RN Outcome: Adequate for Discharge 12/01/2023 1244 by Valora Devere Goodie, RN Outcome: Adequate for Discharge 12/01/2023 1241 by Valora Devere Goodie, RN Outcome: Adequate for Discharge

## 2023-12-01 NOTE — TOC CM/SW Note (Signed)
 Transition of Care [facility]) - Inpatient Brief Assessment   Patient Details  Name: Paige Woodward MRN: [id number] Date of Birth: 1971/04/16  Transition of Care [facility]) CM/SW Contact:    Waddell Barnie Rama, RN Phone Number: 12/01/2023, 10:55 AM   Clinical Narrative: From home with daughter, has PCP and insurance on file, states has no HH services in place at this time or DME at home.  States spouse will transport them home at [employer] and family is support system, states gets medications from [facility] on [address] in [place].  Pta self ambulatory.   There are no TOC needs identified  at this time.  Please place consult for TOC needs.     Transition of Care Asessment: Insurance and Status: Insurance coverage has been reviewed Patient has primary care physician: Yes Home environment has been reviewed: home with daughter Prior level of function:: indep Prior/Current Home Services: No current home services Social Drivers of Health Review: SDOH reviewed no interventions necessary Readmission risk has been reviewed: Yes Transition of care needs: no transition of care needs at this time

## 2023-12-01 NOTE — Plan of Care (Signed)

## 2023-12-01 NOTE — Progress Notes (Signed)
 Discharge instructions provided. Patient verbalizes understanding of all instructions and follow-up care. Patient discharged to home with all belongings at 1240 on 12/01/23.

## 2023-12-01 NOTE — Progress Notes (Signed)
 Per pharmacy representative, okay to pause heparin  for 20 minutes to infuse phenergan .

## 2023-12-01 NOTE — Progress Notes (Signed)
  Progress Note  Patient Name: Paige Woodward Date of Encounter: 12/01/2023 [facility] Cardiologist: Aleene Passe, MD   Interval Summary   No further chest pain. Feels well.   Vital Signs Vitals:   11/30/23 2029 12/01/23 0001 12/01/23 0551 12/01/23 0754  BP: (!) 143/78 (!) 140/86 132/81 (!) 144/85  Pulse: 68 67 61 79  Resp: 18 18 17 18   Temp: 98.5 F (36.9 C) 98.5 F (36.9 C) 98.5 F (36.9 C) 98.6 F (37 C)  TempSrc: Oral Oral Oral Oral  SpO2: 100% 100% 100% 100%  Weight:   91.2 kg   Height:        Intake/Output Summary (Last 24 hours) at 12/01/2023 0948 Last data filed at 12/01/2023 0837 Gross per 24 hour  Intake 638.57 ml  Output 400 ml  Net 238.57 ml      12/01/2023    5:51 AM 11/29/2023    7:28 PM 08/31/2023    9:41 PM  Last 3 Weights  Weight (lbs) 201 lb 212 lb 213 lb 3 oz  Weight (kg) 91.173 kg 96.163 kg 96.7 kg      Telemetry/ECG  NSR - Personally Reviewed  Physical Exam  GEN: No acute distress.   Neck: No JVD Cardiac: RRR, no murmurs, rubs, or gallops.  Respiratory: Clear to auscultation bilaterally. GI: Soft, nontender, non-distended  MS: No edema  Assessment & Plan  Coronary vasospasm. Clearly documented on prior evaluation. Mild troponin leak. Echo is normal. Recommend resumption of Imdur  30 mg daily. Need to keep fresh supply of sl Ntg to use. Continue other medication including amlodipine . Ok to stop heparin . OK for DC home today from CV standpoint. Will arrange follow up in our office.   [facility] will sign off.   The patient is ready for discharge today from a cardiac standpoint. Medication Recommendations:  see MAR Other recommendations (labs, testing, etc):  none Follow up as an outpatient:  4 weeks in our office For questions or updates, please contact [facility] Please consult [email or web address] for contact info under       Signed, Roseann Kees [place], MD

## 2023-12-01 NOTE — Plan of Care (Signed)
  Problem: Education: Goal: Knowledge of General Education information will improve Description: Including pain rating scale, medication(s)/side effects and non-pharmacologic comfort measures 12/01/2023 1244 by Valora Devere Goodie, RN Outcome: Adequate for Discharge 12/01/2023 1241 by Valora Devere Goodie, RN Outcome: Adequate for Discharge   Problem: Health Behavior/Discharge Planning: Goal: Ability to manage health-related needs will improve 12/01/2023 1244 by Valora Devere Goodie, RN Outcome: Adequate for Discharge 12/01/2023 1241 by Valora Devere Goodie, RN Outcome: Adequate for Discharge   Problem: Clinical Measurements: Goal: Ability to maintain clinical measurements within normal limits will improve 12/01/2023 1244 by Valora Devere Goodie, RN Outcome: Adequate for Discharge 12/01/2023 1241 by Valora Devere Goodie, RN Outcome: Adequate for Discharge Goal: Will remain free from infection 12/01/2023 1244 by Valora Devere Goodie, RN Outcome: Adequate for Discharge 12/01/2023 1241 by Valora Devere Goodie, RN Outcome: Adequate for Discharge Goal: Diagnostic test results will improve 12/01/2023 1244 by Valora Devere Goodie, RN Outcome: Adequate for Discharge 12/01/2023 1241 by Valora Devere Goodie, RN Outcome: Adequate for Discharge Goal: Respiratory complications will improve 12/01/2023 1244 by Valora Devere Goodie, RN Outcome: Adequate for Discharge 12/01/2023 1241 by Valora Devere Goodie, RN Outcome: Adequate for Discharge Goal: Cardiovascular complication will be avoided 12/01/2023 1244 by Valora Devere Goodie, RN Outcome: Adequate for Discharge 12/01/2023 1241 by Valora Devere Goodie, RN Outcome: Adequate for Discharge   Problem: Activity: Goal: Risk for activity intolerance will decrease 12/01/2023 1244 by Valora Devere Goodie, RN Outcome: Adequate for Discharge 12/01/2023 1241 by Valora Devere Goodie, RN Outcome: Adequate for Discharge    Problem: Nutrition: Goal: Adequate nutrition will be maintained 12/01/2023 1244 by Valora Devere Goodie, RN Outcome: Adequate for Discharge 12/01/2023 1241 by Valora Devere Goodie, RN Outcome: Adequate for Discharge   Problem: Coping: Goal: Level of anxiety will decrease 12/01/2023 1244 by Valora Devere Goodie, RN Outcome: Adequate for Discharge 12/01/2023 1241 by Valora Devere Goodie, RN Outcome: Adequate for Discharge   Problem: Elimination: Goal: Will not experience complications related to bowel motility 12/01/2023 1244 by Valora Devere Goodie, RN Outcome: Adequate for Discharge 12/01/2023 1241 by Valora Devere Goodie, RN Outcome: Adequate for Discharge Goal: Will not experience complications related to urinary retention 12/01/2023 1244 by Valora Devere Goodie, RN Outcome: Adequate for Discharge 12/01/2023 1241 by Valora Devere Goodie, RN Outcome: Adequate for Discharge   Problem: Pain Managment: Goal: General experience of comfort will improve and/or be controlled 12/01/2023 1244 by Valora Devere Goodie, RN Outcome: Adequate for Discharge 12/01/2023 1241 by Valora Devere Goodie, RN Outcome: Adequate for Discharge   Problem: Safety: Goal: Ability to remain free from injury will improve 12/01/2023 1244 by Valora Devere Goodie, RN Outcome: Adequate for Discharge 12/01/2023 1241 by Valora Devere Goodie, RN Outcome: Adequate for Discharge   Problem: Skin Integrity: Goal: Risk for impaired skin integrity will decrease 12/01/2023 1244 by Valora Devere Goodie, RN Outcome: Adequate for Discharge 12/01/2023 1241 by Valora Devere Goodie, RN Outcome: Adequate for Discharge

## 2023-12-01 NOTE — Telephone Encounter (Signed)
 Please call patient and arrange 4 week follow up from the hospital please.

## 2023-12-01 NOTE — Progress Notes (Signed)
 Called unit pharmacist to ask if safe to give Fioricet when 15 mg of Oxycodone  was given at 0821. Pharmacist states this is safe to give Fioricet now.

## 2023-12-31 NOTE — Progress Notes (Deleted)
 Cardiology Office Note   Date:  12/31/2023  ID:  Paige Woodward, DOB 03-29-1971, MRN [id number] PCP: Paige Rosina SAILOR, PA  Paige Woodward Providers Cardiologist:  None   History of Present Illness Paige Woodward is a 53 y.o. female with a past medical history of NSTEMI, HTN, HLD, stroke in 2018, fibromyalgia, possible A-fib, and GERD here for follow-up.  Patient has a history of stroke in 2018 questionable A-fib at that time.  Was on Eliquis for a few months and then discontinued.  Presented to the ED March 03, 2022 for evaluation of chest pain.  Previous heart cath was negative for significant CAD.  TTE showed normal EF with RWMA.  During this admission troponin rose to 21,000.  EKG revealed T wave inversions in lead III and aVF.  Admitted with NSTEMI.  Chest x-ray was unremarkable.  Left heart cath revealed normal coronary arteries, normal LV function.  SPECT to be related to coronary vasospasm.  Inflammatory markers negative.  Cardiac MRI was negative for myocarditis but did have some basal inferior wall infarct, focal regional hypokinesis in basal inferior wall but normal LVEF and RVEF.  Differential included coronary vasospasm or embolic event.  D-dimer was mildly elevated but LE duplex was negative on DVT.  Started on amlodipine  5 mg daily for coronary vasospasm.  Given her history of CVA and her inability to tolerate aspirin  she was kept on Plavix  at discharge.  Was given a monitor at discharge to rule out atrial fibrillation.  When she was last seen 03/21/2022 she was following up from the hospitalization.  Had a couple instances of intermittent chest pain but not as severe as when she was in the hospital.  Says she will take Alka-Seltzer plus juice if this occurs and knows if chest pain does not go away it is likely not related to her GERD.  Before hospitalization she had a couple sessions of revision leaving.  Says it felt like someone held something over her left eye and  would occur for a few seconds at a time.  Occurred twice for going to the hospital.  She does see a neurologist and denies any recent strokelike symptoms.  Does admit to swelling in her bilateral lower extremities and skin discoloration at times.  Swelling is more in the right lower extremity than left lower extremity and chronic.  This occurred before she was started on amlodipine .  She is taking Lasix in the past but it does not seem to work well for her.  She works full-time as a Engineer, structural.  She does admit to tingling and numbness in her bilateral lower extremities.  She is tolerating medications just fine.  Today, she***  ROS: Pertinent ROS in HPI  Studies Reviewed       Vascular Ultrasound lower extremity DVT on 03/04/22: RIGHT:  - There is no evidence of deep vein thrombosis in the lower extremity.  However, portions of this examination were limited- see technologist  comments above.  - No cystic structure found in the popliteal fossa.  LEFT:  - There is no evidence of deep vein thrombosis in the lower extremity.  - No cystic structure found in the popliteal fossa.   2D Echo on 03/04/22:  1. Left ventricular ejection fraction, by estimation, is 65 to 70%. The  left ventricle has normal function. The left ventricle has no regional  wall motion abnormalities. Left ventricular diastolic parameters were  normal.   2. Right ventricular systolic function is normal. The right  ventricular  size is normal.   3. MR is eccentric, directed anterior into LA. Mild mitral valve  regurgitation.   4. The aortic valve is normal in structure. Aortic valve regurgitation is  not visualized.   cMRI on 03/04/22: 1. Subendocardial late gadolinium enhancement in focal region of basal inferior wall consistent with infarct.   2.  No evidence of myocarditis   3. Normal LV size, wall thickness, and global systolic function (EF 59%). Focal region of hypokinesis in basal inferior wall   4. Normal RV  size and systolic function (EF 63%)     Left heart cath and coronary angiography on 03/03/22: Ms. Culliton has normal coronary arteries as she did back in February of this year. Her LV function was normal. 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 The sheath was removed and pressure held in the groin to achieve hemostasis. The patient left lab in stable condition. She would benefit from being started on a low-dose calcium  channel blocker.   Risk Assessment/Calculations {Does this patient have ATRIAL FIBRILLATION?:[id number]} No BP recorded.  {Refresh Note OR Click here to enter BP  :1}***       Physical Exam VS:  There were no vitals taken for this visit.       Wt Readings from Last 3 Encounters:  12/01/23 201 lb (91.2 kg)  08/31/23 213 lb 3 oz (96.7 kg)  03/21/22 213 lb 3.2 oz (96.7 kg)    GEN: Well nourished, well developed in no acute distress NECK: No JVD; No carotid bruits CARDIAC: ***RRR, no murmurs, rubs, gallops RESPIRATORY:  Clear to auscultation without rales, wheezing or rhonchi  ABDOMEN: Soft, non-tender, non-distended EXTREMITIES:  No edema; No deformity   ASSESSMENT AND PLAN Prinzmetal angina Hypokalemia Bilateral lower remedy edema Numbness and tingling in her lower extremities History of stroke Hypokalemia    {Are you ordering a CV Procedure (e.g. stress test, cath, DCCV, TEE, etc)?   Press F2        :[id number]}  Dispo: ***  Signed, Paige LOISE Fabry, PA-C

## 2024-01-02 ENCOUNTER — Ambulatory Visit: Admitting: Physician Assistant

## 2024-01-02 DIAGNOSIS — E876 Hypokalemia: Secondary | ICD-10-CM

## 2024-01-02 DIAGNOSIS — R600 Localized edema: Secondary | ICD-10-CM

## 2024-01-02 DIAGNOSIS — R079 Chest pain, unspecified: Secondary | ICD-10-CM

## 2024-01-02 DIAGNOSIS — Z8673 Personal history of transient ischemic attack (TIA), and cerebral infarction without residual deficits: Secondary | ICD-10-CM

## 2024-01-02 DIAGNOSIS — R202 Paresthesia of skin: Secondary | ICD-10-CM

## 2024-01-02 DIAGNOSIS — E7849 Other hyperlipidemia: Secondary | ICD-10-CM

## 2024-01-02 DIAGNOSIS — I201 Angina pectoris with documented spasm: Secondary | ICD-10-CM

## 2024-01-03 ENCOUNTER — Ambulatory Visit

## 2024-01-15 NOTE — Progress Notes (Unsigned)
 Cardiology Office Note   Date:  01/17/2024  ID:  Paige Woodward, DOB 03/05/1971, MRN [id number] PCP: Rosalea Rosina SAILOR, PA  [facility] Providers Cardiologist:  None   History of Present Illness Paige Woodward is a 53 y.o. female with a past medical history of NSTEMI, HTN, HLD, stroke in 2018, fibromyalgia, possible A-fib, and GERD here for follow-up.  Patient has a history of stroke in 2018 questionable A-fib at that time.  Was on Eliquis for a few months and then discontinued.  Presented to the ED March 03, 2022 for evaluation of chest pain.  Previous heart cath was negative for significant CAD.  TTE showed normal EF with RWMA.  During this admission troponin rose to 21,000.  EKG revealed T wave inversions in lead III and aVF.  Admitted with NSTEMI.  Chest x-ray was unremarkable.  Left heart cath revealed normal coronary arteries, normal LV function.  SPECT to be related to coronary vasospasm.  Inflammatory markers negative.  Cardiac MRI was negative for myocarditis but did have some basal inferior wall infarct, focal regional hypokinesis in basal inferior wall but normal LVEF and RVEF.  Differential included coronary vasospasm or embolic event.  D-dimer was mildly elevated but LE duplex was negative on DVT.  Started on amlodipine  5 mg daily for coronary vasospasm.  Given her history of CVA and her inability to tolerate aspirin  she was kept on Plavix  at discharge.  Was given a monitor at discharge to rule out atrial fibrillation.  When she was last seen 03/21/2022 she was following up from the hospitalization.  Had a couple instances of intermittent chest pain but not as severe as when she was in the hospital.  Says she will take Alka-Seltzer plus juice if this occurs and knows if chest pain does not go away it is likely not related to her GERD.  Before hospitalization she had a couple sessions of revision leaving.  Says it felt like someone held something over her left eye and  would occur for a few seconds at a time.  Occurred twice for going to the hospital.  She does see a neurologist and denies any recent strokelike symptoms.  Does admit to swelling in her bilateral lower extremities and skin discoloration at times.  Swelling is more in the right lower extremity than left lower extremity and chronic.  This occurred before she was started on amlodipine .  She is taking Lasix in the past but it does not seem to work well for her.  She works full-time as a Engineer, structural.  She does admit to tingling and numbness in her bilateral lower extremities.  She is tolerating medications just fine.  Today, Paige Woodward is a 53 year old female with coronary artery disease who presents for a hospital follow-up after experiencing chest pain.  She was admitted to the hospital on November 29, 2023, for chest pain and underwent a comprehensive evaluation, including an echocardiogram, which showed good heart function and a mild mitral valve leak. She was discharged on Imdur  30 mg, metoprolol  50 mg daily, Plavix  75 mg, and nitroglycerin  as needed. Since discharge, she has had no recurrence of chest pain and reports improvement in symptoms.  She experienced her first mild myocardial infarction at age 26. Her family history is significant for coronary disease, with her father having had a myocardial infarction and a massive stroke, leading to his death at age 85.  She monitors her blood pressure at home, noting occasional elevations, which she  attributes to pain from her pinched nerve and arthritis. She has been on various antihypertensive medications, including losartan and hydrochlorothiazide, but currently takes metoprolol  and amlodipine .  Reports no shortness of breath nor dyspnea on exertion. No edema, orthopnea, PND. Reports no palpitations.   Discussed the use of AI scribe software for clinical note transcription with the patient, who gave verbal consent to proceed.   ROS: Pertinent ROS  in HPI  Studies Reviewed    Vascular Ultrasound lower extremity DVT on 03/04/22: RIGHT:  - There is no evidence of deep vein thrombosis in the lower extremity.  However, portions of this examination were limited- see technologist  comments above.  - No cystic structure found in the popliteal fossa.  LEFT:  - There is no evidence of deep vein thrombosis in the lower extremity.  - No cystic structure found in the popliteal fossa.   2D Echo on 03/04/22:  1. Left ventricular ejection fraction, by estimation, is 65 to 70%. The  left ventricle has normal function. The left ventricle has no regional  wall motion abnormalities. Left ventricular diastolic parameters were  normal.   2. Right ventricular systolic function is normal. The right ventricular  size is normal.   3. MR is eccentric, directed anterior into LA. Mild mitral valve  regurgitation.   4. The aortic valve is normal in structure. Aortic valve regurgitation is  not visualized.   cMRI on 03/04/22: 1. Subendocardial late gadolinium enhancement in focal region of basal inferior wall consistent with infarct.   2.  No evidence of myocarditis   3. Normal LV size, wall thickness, and global systolic function (EF 59%). Focal region of hypokinesis in basal inferior wall   4. Normal RV size and systolic function (EF 63%)     Left heart cath and coronary angiography on 03/03/22: Ms. Kissinger has normal coronary arteries as she did back in February of this year. Her LV function was normal. 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 The sheath was removed and pressure held in the groin to achieve hemostasis. The patient left lab in stable condition. She would benefit from being started on a low-dose calcium  channel blocker.     Physical Exam VS:  BP 132/72 (BP Location: Right Arm, Patient Position: Sitting, Cuff Size: Normal)   Pulse 88   Resp 16   Ht 5' 7 (1.702 m)   Wt 208 lb 6.4 oz (94.5 kg)   SpO2 98%   BMI  32.64 kg/m        Wt Readings from Last 3 Encounters:  01/17/24 208 lb 6.4 oz (94.5 kg)  12/01/23 201 lb (91.2 kg)  08/31/23 213 lb 3 oz (96.7 kg)    GEN: Well nourished, well developed in no acute distress NECK: No JVD; right carotid bruit CARDIAC: RRR, no murmurs, rubs, gallops RESPIRATORY:  Clear to auscultation without rales, wheezing or rhonchi  ABDOMEN: Soft, non-tender, non-distended EXTREMITIES:  No edema; No deformity   ASSESSMENT AND PLAN  Angina pectoris with documented spasm/Prinzmetal angina No chest pain since hospital discharge. Current medication regimen effective. - Continue current medication regimen: Imdur  30 mg, metoprolol  50 mg daily, Plavix  75 mg, and nitroglycerin  as needed.  Suspected carotid artery stenosis with history of transient ischemic attack Recent TIA-like symptoms and detected extra sound on the right neck suggest possible carotid stenosis. -previous strokes - Order ultrasound of the neck to assess for carotid stenosis. - Consider referral to vascular surgeon if significant stenosis is found.  Essential hypertension with medication-induced lower extremity edema Blood pressure slightly elevated at home, controlled today. Edema likely due to amlodipine . Prefers to avoid diuretics. - Reduce amlodipine  to 5 mg and monitor for improvement in edema. - Track blood pressure and report values after one week. - Consider adding low-dose losartan if blood pressure increases.  Mild mitral valve regurgitation Echocardiogram shows mild mitral valve regurgitation with normal EF 60-65%.   Dispo: She can follow-up in 6 months  with me or APP  Signed, Orren LOISE Fabry, PA-C

## 2024-01-17 ENCOUNTER — Ambulatory Visit: Attending: Physician Assistant | Admitting: Physician Assistant

## 2024-01-17 ENCOUNTER — Encounter: Payer: Self-pay | Admitting: Physician Assistant

## 2024-01-17 VITALS — BP 132/72 | HR 88 | Resp 16 | Ht 67.0 in | Wt 208.4 lb

## 2024-01-17 DIAGNOSIS — R600 Localized edema: Secondary | ICD-10-CM

## 2024-01-17 DIAGNOSIS — I201 Angina pectoris with documented spasm: Secondary | ICD-10-CM | POA: Diagnosis not present

## 2024-01-17 DIAGNOSIS — E876 Hypokalemia: Secondary | ICD-10-CM

## 2024-01-17 DIAGNOSIS — R202 Paresthesia of skin: Secondary | ICD-10-CM

## 2024-01-17 DIAGNOSIS — R200 Anesthesia of skin: Secondary | ICD-10-CM

## 2024-01-17 DIAGNOSIS — Z8673 Personal history of transient ischemic attack (TIA), and cerebral infarction without residual deficits: Secondary | ICD-10-CM

## 2024-01-17 DIAGNOSIS — I4891 Unspecified atrial fibrillation: Secondary | ICD-10-CM

## 2024-01-17 DIAGNOSIS — R0989 Other specified symptoms and signs involving the circulatory and respiratory systems: Secondary | ICD-10-CM

## 2024-01-17 MED ORDER — AMLODIPINE BESYLATE 5 MG PO TABS: 5.0000 mg | ORAL_TABLET | Freq: Every day | ORAL | 3 refills | Status: AC

## 2024-01-17 NOTE — Patient Instructions (Addendum)
 Medication Instructions:  Your physician has recommended you make the following change in your medication:  DECREASE AMLODIPINE  TO 5 MG DAILY.  *If you need a refill on your cardiac medications before your next appointment, please call your pharmacy*  Lab Work: NONE If you have labs (blood work) drawn today and your tests are completely normal, you will receive your results only by: MyChart Message (if you have MyChart) OR A paper copy in the mail If you have any lab test that is abnormal or we need to change your treatment, we will call you to review the results.  Testing/Procedures: NONE  Follow-Up: At [facility], you and your health needs are our priority.  As part of our continuing mission to provide you with exceptional heart care, our providers are all part of one team.  This team includes your primary Cardiologist (physician) and Advanced Practice Providers or APPs (Physician Assistants and Nurse Practitioners) who all work together to provide you with the care you need, when you need it.  Your next appointment:   6 month(s)  Provider:   Orren Fabry, PA-C        We recommend signing up for the patient portal called MyChart.  Sign up information is provided on this After Visit Summary.  MyChart is used to connect with patients for Virtual Visits (Telemedicine).  Patients are able to view lab/test results, encounter notes, upcoming appointments, etc.  Non-urgent messages can be sent to your provider as well.   To learn more about what you can do with MyChart, go to [email or web address].   Other Instructions Please check your blood pressure 1-2 times per day for 1 week and send readings to [facility], PA-C.

## 2024-01-18 ENCOUNTER — Ambulatory Visit

## 2024-02-02 ENCOUNTER — Inpatient Hospital Stay (HOSPITAL_COMMUNITY): Admission: RE | Admit: 2024-02-02 | Source: Ambulatory Visit

## 2024-02-08 ENCOUNTER — Ambulatory Visit (HOSPITAL_COMMUNITY): Admission: RE | Admit: 2024-02-08 | Source: Ambulatory Visit

## 2024-02-13 ENCOUNTER — Ambulatory Visit (HOSPITAL_COMMUNITY)
Admission: RE | Admit: 2024-02-13 | Discharge: 2024-02-13 | Disposition: A | Discharge status: Home / Self Care | Source: Ambulatory Visit | Attending: Physician Assistant | Admitting: Physician Assistant

## 2024-02-13 ENCOUNTER — Ambulatory Visit: Payer: Self-pay | Admitting: Physician Assistant

## 2024-02-13 DIAGNOSIS — R0989 Other specified symptoms and signs involving the circulatory and respiratory systems: Secondary | ICD-10-CM | POA: Insufficient documentation

## 2024-03-12 ENCOUNTER — Telehealth: Payer: Self-pay | Admitting: Physician Assistant

## 2024-03-12 MED ORDER — ISOSORBIDE MONONITRATE ER 30 MG PO TB24: 30.0000 mg | ORAL_TABLET | Freq: Every day | ORAL | 0 refills | Status: DC

## 2024-03-12 MED ORDER — METOPROLOL SUCCINATE ER 50 MG PO TB24: 50.0000 mg | ORAL_TABLET | Freq: Every day | ORAL | 3 refills | Status: AC

## 2024-03-12 NOTE — Telephone Encounter (Signed)
*  STAT* If patient is at the pharmacy, call can be transferred to refill team.   1. Which medications need to be refilled? (please list name of each medication and dose if known) metoprolol  succinate (TOPROL -XL) 50 MG 24 hr tablet (Expired)    2. Would you like to learn more about the convenience, safety, & potential cost savings by using the [facility]? No    3. Are you open to using the [facility] (Type [facility].No    4. Which pharmacy/location (including street and city if local pharmacy) is medication to be sent to? [id number] - [place], [place] - [address]       5. Do they need a 30 day or 90 day supply? 90 day   Pt advised by pcp to contact us  for refill.

## 2024-03-12 NOTE — Telephone Encounter (Signed)
 Pt's medication was sent to pt's pharmacy as requested. Confirmation received.

## 2024-04-18 ENCOUNTER — Telehealth: Payer: Self-pay | Admitting: Hematology and Oncology

## 2024-04-18 NOTE — Telephone Encounter (Signed)
 I spoke with patient to see if she could come in on 05/02/2024 instead of 05/03/2024. Patient is aware of date/time change.

## 2024-05-02 ENCOUNTER — Inpatient Hospital Stay

## 2024-05-02 ENCOUNTER — Inpatient Hospital Stay: Attending: Hematology and Oncology | Admitting: Hematology and Oncology

## 2024-05-03 ENCOUNTER — Inpatient Hospital Stay: Admitting: Hematology and Oncology

## 2024-05-03 ENCOUNTER — Inpatient Hospital Stay

## 2024-06-20 ENCOUNTER — Other Ambulatory Visit: Payer: Self-pay

## 2024-06-20 MED ORDER — ISOSORBIDE MONONITRATE ER 30 MG PO TB24: 30.0000 mg | ORAL_TABLET | Freq: Every day | ORAL | 0 refills | Status: AC
